# Patient Record
Sex: Female | Born: 1975 | Race: Black or African American | Hispanic: No | Marital: Single | Smoking: Former smoker
Health system: Southern US, Community
[De-identification: ages and names within clinical notes are randomized; demographics above are authoritative.]

## PROBLEM LIST (undated history)

## (undated) DIAGNOSIS — Z720 Tobacco use: Secondary | ICD-10-CM

## (undated) DIAGNOSIS — U071 COVID-19: Secondary | ICD-10-CM

## (undated) DIAGNOSIS — R519 Headache, unspecified: Secondary | ICD-10-CM

## (undated) DIAGNOSIS — Z6841 Body Mass Index (BMI) 40.0 and over, adult: Secondary | ICD-10-CM

## (undated) DIAGNOSIS — B009 Herpesviral infection, unspecified: Secondary | ICD-10-CM

## (undated) DIAGNOSIS — G4733 Obstructive sleep apnea (adult) (pediatric): Secondary | ICD-10-CM

## (undated) DIAGNOSIS — F1010 Alcohol abuse, uncomplicated: Secondary | ICD-10-CM

## (undated) DIAGNOSIS — I10 Essential (primary) hypertension: Secondary | ICD-10-CM

## (undated) DIAGNOSIS — E119 Type 2 diabetes mellitus without complications: Secondary | ICD-10-CM

## (undated) DIAGNOSIS — L409 Psoriasis, unspecified: Secondary | ICD-10-CM

## (undated) DIAGNOSIS — E785 Hyperlipidemia, unspecified: Secondary | ICD-10-CM

## (undated) DIAGNOSIS — M1990 Unspecified osteoarthritis, unspecified site: Secondary | ICD-10-CM

## (undated) DIAGNOSIS — M5412 Radiculopathy, cervical region: Secondary | ICD-10-CM

## (undated) HISTORY — DX: Radiculopathy, cervical region: M54.12

## (undated) HISTORY — DX: Essential (primary) hypertension: I10

## (undated) HISTORY — DX: Hyperlipidemia, unspecified: E78.5

## (undated) HISTORY — DX: COVID-19: U07.1

## (undated) HISTORY — DX: Headache, unspecified: R51.9

## (undated) HISTORY — DX: Type 2 diabetes mellitus without complications: E11.9

## (undated) HISTORY — DX: Herpesviral infection, unspecified: B00.9

## (undated) HISTORY — PX: OTHER SURGICAL HISTORY: SHX169

## (undated) HISTORY — DX: Unspecified osteoarthritis, unspecified site: M19.90

---
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
## 1997-09-25 ENCOUNTER — Ambulatory Visit (HOSPITAL_COMMUNITY): Admission: RE | Admit: 1997-09-25 | Discharge: 1997-09-25 | Payer: Self-pay | Admitting: Family Medicine

## 1998-04-04 DIAGNOSIS — E119 Type 2 diabetes mellitus without complications: Secondary | ICD-10-CM

## 1998-04-04 HISTORY — DX: Type 2 diabetes mellitus without complications: E11.9

## 1999-03-12 HISTORY — PX: BREAST BIOPSY: SHX20

## 1999-04-06 ENCOUNTER — Emergency Department (HOSPITAL_COMMUNITY): Admission: EM | Admit: 1999-04-06 | Discharge: 1999-04-07 | Payer: Self-pay | Admitting: Emergency Medicine

## 1999-04-06 ENCOUNTER — Encounter: Payer: Self-pay | Admitting: Emergency Medicine

## 1999-11-25 ENCOUNTER — Other Ambulatory Visit: Admission: RE | Admit: 1999-11-25 | Discharge: 1999-11-25 | Payer: Self-pay | Admitting: *Deleted

## 2000-10-04 ENCOUNTER — Emergency Department (HOSPITAL_COMMUNITY): Admission: EM | Admit: 2000-10-04 | Discharge: 2000-10-04 | Payer: Self-pay | Admitting: Emergency Medicine

## 2003-02-24 DIAGNOSIS — E119 Type 2 diabetes mellitus without complications: Secondary | ICD-10-CM | POA: Insufficient documentation

## 2003-10-16 ENCOUNTER — Encounter: Admission: RE | Admit: 2003-10-16 | Discharge: 2003-10-16 | Payer: Self-pay | Admitting: Family Medicine

## 2003-12-09 ENCOUNTER — Ambulatory Visit: Payer: Self-pay | Admitting: Family Medicine

## 2003-12-09 ENCOUNTER — Ambulatory Visit: Payer: Self-pay | Admitting: *Deleted

## 2004-01-12 ENCOUNTER — Emergency Department (HOSPITAL_COMMUNITY): Admission: EM | Admit: 2004-01-12 | Discharge: 2004-01-12 | Payer: Self-pay | Admitting: *Deleted

## 2004-02-20 ENCOUNTER — Ambulatory Visit: Payer: Self-pay | Admitting: Family Medicine

## 2004-02-23 ENCOUNTER — Ambulatory Visit: Payer: Self-pay | Admitting: Family Medicine

## 2004-04-20 ENCOUNTER — Ambulatory Visit: Payer: Self-pay | Admitting: Family Medicine

## 2004-07-03 DIAGNOSIS — B009 Herpesviral infection, unspecified: Secondary | ICD-10-CM | POA: Insufficient documentation

## 2004-07-05 ENCOUNTER — Ambulatory Visit: Payer: Self-pay | Admitting: Family Medicine

## 2004-07-08 ENCOUNTER — Ambulatory Visit: Payer: Self-pay | Admitting: Internal Medicine

## 2004-07-30 ENCOUNTER — Ambulatory Visit: Payer: Self-pay | Admitting: Family Medicine

## 2004-08-03 ENCOUNTER — Ambulatory Visit: Payer: Self-pay | Admitting: Family Medicine

## 2004-08-18 ENCOUNTER — Ambulatory Visit: Payer: Self-pay | Admitting: Family Medicine

## 2004-08-18 ENCOUNTER — Ambulatory Visit (HOSPITAL_COMMUNITY): Admission: RE | Admit: 2004-08-18 | Discharge: 2004-08-18 | Payer: Self-pay | Admitting: Family Medicine

## 2004-09-02 ENCOUNTER — Ambulatory Visit: Payer: Self-pay | Admitting: Family Medicine

## 2004-09-08 ENCOUNTER — Encounter: Admission: RE | Admit: 2004-09-08 | Discharge: 2004-12-07 | Payer: Self-pay | Admitting: Family Medicine

## 2004-09-13 ENCOUNTER — Ambulatory Visit: Payer: Self-pay | Admitting: Family Medicine

## 2004-09-23 ENCOUNTER — Ambulatory Visit: Payer: Self-pay | Admitting: Family Medicine

## 2004-09-30 ENCOUNTER — Ambulatory Visit: Payer: Self-pay | Admitting: Family Medicine

## 2004-10-07 ENCOUNTER — Ambulatory Visit: Payer: Self-pay | Admitting: Family Medicine

## 2004-12-03 ENCOUNTER — Ambulatory Visit: Payer: Self-pay | Admitting: Family Medicine

## 2004-12-20 ENCOUNTER — Encounter: Admission: RE | Admit: 2004-12-20 | Discharge: 2005-03-20 | Payer: Self-pay | Admitting: Family Medicine

## 2005-01-05 ENCOUNTER — Ambulatory Visit: Payer: Self-pay | Admitting: Family Medicine

## 2005-01-14 ENCOUNTER — Ambulatory Visit (HOSPITAL_COMMUNITY): Admission: RE | Admit: 2005-01-14 | Discharge: 2005-01-14 | Payer: Self-pay | Admitting: Family Medicine

## 2005-03-07 ENCOUNTER — Ambulatory Visit: Payer: Self-pay | Admitting: Family Medicine

## 2005-05-31 ENCOUNTER — Ambulatory Visit: Payer: Self-pay | Admitting: Family Medicine

## 2005-07-19 ENCOUNTER — Ambulatory Visit: Payer: Self-pay | Admitting: Family Medicine

## 2005-09-05 ENCOUNTER — Ambulatory Visit: Payer: Self-pay | Admitting: Family Medicine

## 2005-09-19 ENCOUNTER — Ambulatory Visit: Payer: Self-pay | Admitting: Family Medicine

## 2005-09-21 ENCOUNTER — Ambulatory Visit: Payer: Self-pay | Admitting: Family Medicine

## 2006-01-27 ENCOUNTER — Ambulatory Visit: Payer: Self-pay | Admitting: Family Medicine

## 2006-03-02 ENCOUNTER — Ambulatory Visit: Payer: Self-pay | Admitting: Internal Medicine

## 2006-03-09 ENCOUNTER — Ambulatory Visit: Payer: Self-pay | Admitting: Internal Medicine

## 2006-03-24 ENCOUNTER — Ambulatory Visit: Payer: Self-pay | Admitting: Internal Medicine

## 2006-04-18 ENCOUNTER — Ambulatory Visit: Payer: Self-pay | Admitting: Internal Medicine

## 2006-06-15 ENCOUNTER — Ambulatory Visit: Payer: Self-pay | Admitting: Internal Medicine

## 2006-06-20 ENCOUNTER — Ambulatory Visit: Payer: Self-pay | Admitting: Internal Medicine

## 2006-07-18 ENCOUNTER — Ambulatory Visit: Payer: Self-pay | Admitting: Internal Medicine

## 2006-08-01 ENCOUNTER — Ambulatory Visit: Payer: Self-pay | Admitting: Internal Medicine

## 2006-12-20 ENCOUNTER — Encounter (INDEPENDENT_AMBULATORY_CARE_PROVIDER_SITE_OTHER): Payer: Self-pay | Admitting: *Deleted

## 2007-08-24 ENCOUNTER — Encounter (INDEPENDENT_AMBULATORY_CARE_PROVIDER_SITE_OTHER): Payer: Self-pay | Admitting: Internal Medicine

## 2007-09-24 ENCOUNTER — Ambulatory Visit: Payer: Self-pay | Admitting: Nurse Practitioner

## 2007-09-24 DIAGNOSIS — K029 Dental caries, unspecified: Secondary | ICD-10-CM | POA: Insufficient documentation

## 2007-09-24 DIAGNOSIS — I10 Essential (primary) hypertension: Secondary | ICD-10-CM | POA: Insufficient documentation

## 2007-09-24 DIAGNOSIS — J309 Allergic rhinitis, unspecified: Secondary | ICD-10-CM | POA: Insufficient documentation

## 2007-09-24 LAB — CONVERTED CEMR LAB
Albumin: 4.2 g/dL (ref 3.5–5.2)
Alkaline Phosphatase: 73 units/L (ref 39–117)
BUN: 5 mg/dL — ABNORMAL LOW (ref 6–23)
Basophils Relative: 0 % (ref 0–1)
Blood Glucose, Fingerstick: 228
Blood in Urine, dipstick: NEGATIVE
Creatinine, Ser: 0.49 mg/dL (ref 0.40–1.20)
Eosinophils Relative: 2 % (ref 0–5)
HCT: 39.8 % (ref 36.0–46.0)
Lymphocytes Relative: 38 % (ref 12–46)
Lymphs Abs: 2.9 10*3/uL (ref 0.7–4.0)
MCHC: 31.4 g/dL (ref 30.0–36.0)
Monocytes Absolute: 1 10*3/uL (ref 0.1–1.0)
Neutro Abs: 3.6 10*3/uL (ref 1.7–7.7)
Neutrophils Relative %: 48 % (ref 43–77)
Platelets: 481 10*3/uL — ABNORMAL HIGH (ref 150–400)
RDW: 14.4 % (ref 11.5–15.5)
Specific Gravity, Urine: 1.015
TSH: 2.525 microintl units/mL (ref 0.350–5.50)
Total Bilirubin: 0.3 mg/dL (ref 0.3–1.2)
Urobilinogen, UA: 0.2
WBC: 7.5 10*3/uL (ref 4.0–10.5)

## 2007-09-25 ENCOUNTER — Encounter (INDEPENDENT_AMBULATORY_CARE_PROVIDER_SITE_OTHER): Payer: Self-pay | Admitting: Nurse Practitioner

## 2007-10-04 ENCOUNTER — Ambulatory Visit: Payer: Self-pay | Admitting: Internal Medicine

## 2007-10-04 ENCOUNTER — Encounter (INDEPENDENT_AMBULATORY_CARE_PROVIDER_SITE_OTHER): Payer: Self-pay | Admitting: *Deleted

## 2007-10-04 LAB — CONVERTED CEMR LAB
KOH Prep: NEGATIVE
pH: 5.5

## 2007-10-09 ENCOUNTER — Ambulatory Visit: Payer: Self-pay | Admitting: Internal Medicine

## 2007-10-09 ENCOUNTER — Encounter (INDEPENDENT_AMBULATORY_CARE_PROVIDER_SITE_OTHER): Payer: Self-pay | Admitting: Nurse Practitioner

## 2007-10-10 ENCOUNTER — Encounter (INDEPENDENT_AMBULATORY_CARE_PROVIDER_SITE_OTHER): Payer: Self-pay | Admitting: Nurse Practitioner

## 2007-10-10 DIAGNOSIS — S058X9A Other injuries of unspecified eye and orbit, initial encounter: Secondary | ICD-10-CM | POA: Insufficient documentation

## 2007-10-10 LAB — CONVERTED CEMR LAB
Cholesterol: 177 mg/dL (ref 0–200)
HDL: 58 mg/dL (ref >39)
LDL Cholesterol: 105 mg/dL — ABNORMAL HIGH (ref 0–99)
Total CHOL/HDL Ratio: 3.1
Triglycerides: 69 mg/dL (ref <150)

## 2007-11-08 ENCOUNTER — Telehealth (INDEPENDENT_AMBULATORY_CARE_PROVIDER_SITE_OTHER): Payer: Self-pay | Admitting: Nurse Practitioner

## 2007-12-04 ENCOUNTER — Ambulatory Visit: Payer: Self-pay | Admitting: Internal Medicine

## 2007-12-04 ENCOUNTER — Encounter (INDEPENDENT_AMBULATORY_CARE_PROVIDER_SITE_OTHER): Payer: Self-pay | Admitting: Nurse Practitioner

## 2007-12-17 ENCOUNTER — Encounter (INDEPENDENT_AMBULATORY_CARE_PROVIDER_SITE_OTHER): Payer: Self-pay | Admitting: Nurse Practitioner

## 2007-12-17 DIAGNOSIS — L408 Other psoriasis: Secondary | ICD-10-CM | POA: Insufficient documentation

## 2007-12-21 ENCOUNTER — Telehealth (INDEPENDENT_AMBULATORY_CARE_PROVIDER_SITE_OTHER): Payer: Self-pay | Admitting: *Deleted

## 2007-12-28 ENCOUNTER — Ambulatory Visit: Payer: Self-pay | Admitting: Nurse Practitioner

## 2007-12-28 DIAGNOSIS — N760 Acute vaginitis: Secondary | ICD-10-CM

## 2007-12-28 LAB — CONVERTED CEMR LAB: KOH Prep: NEGATIVE

## 2008-01-09 ENCOUNTER — Ambulatory Visit: Payer: Self-pay | Admitting: Nurse Practitioner

## 2008-01-10 ENCOUNTER — Ambulatory Visit: Payer: Self-pay | Admitting: Nurse Practitioner

## 2008-01-10 DIAGNOSIS — M79609 Pain in unspecified limb: Secondary | ICD-10-CM

## 2008-01-10 DIAGNOSIS — Z6841 Body Mass Index (BMI) 40.0 and over, adult: Secondary | ICD-10-CM | POA: Insufficient documentation

## 2008-01-10 DIAGNOSIS — E785 Hyperlipidemia, unspecified: Secondary | ICD-10-CM

## 2008-01-10 LAB — CONVERTED CEMR LAB
Cholesterol, target level: 200 mg/dL
Hgb A1c MFr Bld: 6.6 %

## 2008-01-11 ENCOUNTER — Encounter (INDEPENDENT_AMBULATORY_CARE_PROVIDER_SITE_OTHER): Payer: Self-pay | Admitting: Nurse Practitioner

## 2008-01-11 LAB — CONVERTED CEMR LAB
ALT: 12 units/L (ref 0–35)
Alkaline Phosphatase: 100 units/L (ref 39–117)
BUN: 6 mg/dL (ref 6–23)
Creatinine, Ser: 0.5 mg/dL (ref 0.40–1.20)
Potassium: 4.4 meq/L (ref 3.5–5.3)
Total Bilirubin: 0.4 mg/dL (ref 0.3–1.2)
VLDL: 19 mg/dL (ref 0–40)

## 2008-01-30 ENCOUNTER — Telehealth (INDEPENDENT_AMBULATORY_CARE_PROVIDER_SITE_OTHER): Payer: Self-pay | Admitting: Nurse Practitioner

## 2008-03-07 ENCOUNTER — Ambulatory Visit: Payer: Self-pay | Admitting: Internal Medicine

## 2008-03-13 ENCOUNTER — Encounter (INDEPENDENT_AMBULATORY_CARE_PROVIDER_SITE_OTHER): Payer: Self-pay | Admitting: Nurse Practitioner

## 2008-03-18 ENCOUNTER — Encounter (INDEPENDENT_AMBULATORY_CARE_PROVIDER_SITE_OTHER): Payer: Self-pay | Admitting: Nurse Practitioner

## 2008-03-31 ENCOUNTER — Ambulatory Visit: Payer: Self-pay | Admitting: Nurse Practitioner

## 2008-03-31 LAB — CONVERTED CEMR LAB
Bilirubin Urine: NEGATIVE
Blood Glucose, Fingerstick: 134
Blood in Urine, dipstick: NEGATIVE
Glucose, Urine, Semiquant: NEGATIVE
KOH Prep: NEGATIVE
Ketones, urine, test strip: NEGATIVE
Nitrite: NEGATIVE
Protein, U semiquant: NEGATIVE
Specific Gravity, Urine: <1.005
Urobilinogen, UA: 0.2
pH: 7

## 2008-04-29 ENCOUNTER — Encounter (INDEPENDENT_AMBULATORY_CARE_PROVIDER_SITE_OTHER): Payer: Self-pay | Admitting: Nurse Practitioner

## 2008-08-15 ENCOUNTER — Ambulatory Visit: Payer: Self-pay | Admitting: Nurse Practitioner

## 2008-08-15 DIAGNOSIS — M722 Plantar fascial fibromatosis: Secondary | ICD-10-CM | POA: Insufficient documentation

## 2008-08-15 DIAGNOSIS — N6320 Unspecified lump in the left breast, unspecified quadrant: Secondary | ICD-10-CM | POA: Insufficient documentation

## 2008-08-15 DIAGNOSIS — N63 Unspecified lump in unspecified breast: Secondary | ICD-10-CM | POA: Insufficient documentation

## 2008-08-15 LAB — CONVERTED CEMR LAB
Blood Glucose, Fingerstick: 105
Hgb A1c MFr Bld: 6.9 %

## 2008-08-18 ENCOUNTER — Encounter: Admission: RE | Admit: 2008-08-18 | Discharge: 2008-08-18 | Payer: Self-pay | Admitting: Internal Medicine

## 2008-08-18 LAB — CONVERTED CEMR LAB
Albumin: 3.9 g/dL (ref 3.5–5.2)
Alkaline Phosphatase: 96 units/L (ref 39–117)
Basophils Relative: 1 % (ref 0–1)
Chloride: 101 meq/L (ref 96–112)
Cholesterol: 151 mg/dL (ref 0–200)
Eosinophils Absolute: 0.1 10*3/uL (ref 0.0–0.7)
Eosinophils Relative: 1 % (ref 0–5)
HCT: 36.3 % (ref 36.0–46.0)
LDL Cholesterol: 88 mg/dL (ref 0–99)
Lymphocytes Relative: 36 % (ref 12–46)
Lymphs Abs: 2.5 10*3/uL (ref 0.7–4.0)
MCV: 81.2 fL (ref 78.0–100.0)
Monocytes Absolute: 0.6 10*3/uL (ref 0.1–1.0)
Monocytes Relative: 9 % (ref 3–12)
RBC: 4.47 M/uL (ref 3.87–5.11)
Sodium: 138 meq/L (ref 135–145)
TSH: 3.538 microintl units/mL (ref 0.350–4.500)
Total CHOL/HDL Ratio: 2.9
Total Protein: 6.9 g/dL (ref 6.0–8.3)
Triglycerides: 54 mg/dL (ref <150)
VLDL: 11 mg/dL (ref 0–40)

## 2008-10-07 ENCOUNTER — Ambulatory Visit: Payer: Self-pay | Admitting: Internal Medicine

## 2008-10-07 ENCOUNTER — Encounter (INDEPENDENT_AMBULATORY_CARE_PROVIDER_SITE_OTHER): Payer: Self-pay | Admitting: Nurse Practitioner

## 2008-11-12 ENCOUNTER — Telehealth (INDEPENDENT_AMBULATORY_CARE_PROVIDER_SITE_OTHER): Payer: Self-pay | Admitting: Nurse Practitioner

## 2009-03-17 ENCOUNTER — Ambulatory Visit: Payer: Self-pay | Admitting: Physician Assistant

## 2009-03-17 DIAGNOSIS — S60949A Unspecified superficial injury of unspecified finger, initial encounter: Secondary | ICD-10-CM | POA: Insufficient documentation

## 2009-03-17 LAB — CONVERTED CEMR LAB
Blood Glucose, Fingerstick: 97
Hgb A1c MFr Bld: 6.3 %

## 2009-04-06 ENCOUNTER — Encounter: Admission: RE | Admit: 2009-04-06 | Discharge: 2009-04-06 | Payer: Self-pay | Admitting: Internal Medicine

## 2009-04-17 ENCOUNTER — Ambulatory Visit: Payer: Self-pay | Admitting: Nurse Practitioner

## 2009-04-17 DIAGNOSIS — A5901 Trichomonal vulvovaginitis: Secondary | ICD-10-CM | POA: Insufficient documentation

## 2009-04-17 LAB — CONVERTED CEMR LAB
Bilirubin Urine: NEGATIVE
Blood in Urine, dipstick: NEGATIVE
Glucose, Urine, Semiquant: NEGATIVE
KOH Prep: NEGATIVE

## 2009-04-20 ENCOUNTER — Ambulatory Visit: Payer: Self-pay | Admitting: Nurse Practitioner

## 2009-04-20 LAB — CONVERTED CEMR LAB
Cholesterol: 118 mg/dL (ref 0–200)
LDL Cholesterol: 59 mg/dL (ref 0–99)
Total CHOL/HDL Ratio: 2.5
Triglycerides: 58 mg/dL (ref <150)
VLDL: 12 mg/dL (ref 0–40)

## 2009-04-21 ENCOUNTER — Telehealth (INDEPENDENT_AMBULATORY_CARE_PROVIDER_SITE_OTHER): Payer: Self-pay | Admitting: Nurse Practitioner

## 2009-04-21 ENCOUNTER — Encounter (INDEPENDENT_AMBULATORY_CARE_PROVIDER_SITE_OTHER): Payer: Self-pay | Admitting: Nurse Practitioner

## 2009-04-21 LAB — CONVERTED CEMR LAB: Microalb, Ur: 0.83 mg/dL (ref 0.00–1.89)

## 2009-04-23 LAB — CONVERTED CEMR LAB
AST: 13 units/L (ref 0–37)
Albumin: 3.9 g/dL (ref 3.5–5.2)
BUN: 8 mg/dL (ref 6–23)
Basophils Absolute: 0.1 10*3/uL (ref 0.0–0.1)
CO2: 23 meq/L (ref 19–32)
Creatinine, Ser: 0.55 mg/dL (ref 0.40–1.20)
Eosinophils Absolute: 0.1 10*3/uL (ref 0.0–0.7)
Glucose, Bld: 80 mg/dL (ref 70–99)
Hemoglobin: 11 g/dL — ABNORMAL LOW (ref 12.0–15.0)
Lymphs Abs: 3.5 10*3/uL (ref 0.7–4.0)
Neutro Abs: 5 10*3/uL (ref 1.7–7.7)
Neutrophils Relative %: 53 % (ref 43–77)
Platelets: 481 10*3/uL — ABNORMAL HIGH (ref 150–400)
Potassium: 4.3 meq/L (ref 3.5–5.3)
TSH: 2.346 microintl units/mL (ref 0.350–4.500)
Total Protein: 7.2 g/dL (ref 6.0–8.3)

## 2009-05-01 ENCOUNTER — Ambulatory Visit: Payer: Self-pay | Admitting: Nurse Practitioner

## 2009-05-01 ENCOUNTER — Telehealth (INDEPENDENT_AMBULATORY_CARE_PROVIDER_SITE_OTHER): Payer: Self-pay | Admitting: Nurse Practitioner

## 2009-05-20 ENCOUNTER — Ambulatory Visit: Payer: Self-pay | Admitting: Nurse Practitioner

## 2009-05-22 ENCOUNTER — Ambulatory Visit: Payer: Self-pay | Admitting: Nurse Practitioner

## 2009-06-05 ENCOUNTER — Encounter (INDEPENDENT_AMBULATORY_CARE_PROVIDER_SITE_OTHER): Payer: Self-pay | Admitting: Nurse Practitioner

## 2009-09-28 ENCOUNTER — Ambulatory Visit: Payer: Self-pay | Admitting: Nurse Practitioner

## 2009-09-28 DIAGNOSIS — H02849 Edema of unspecified eye, unspecified eyelid: Secondary | ICD-10-CM

## 2009-09-30 ENCOUNTER — Telehealth (INDEPENDENT_AMBULATORY_CARE_PROVIDER_SITE_OTHER): Payer: Self-pay | Admitting: Nurse Practitioner

## 2010-01-28 ENCOUNTER — Ambulatory Visit: Payer: Self-pay | Admitting: Nurse Practitioner

## 2010-01-28 LAB — CONVERTED CEMR LAB: Blood Glucose, Fingerstick: 129

## 2010-01-30 ENCOUNTER — Encounter (INDEPENDENT_AMBULATORY_CARE_PROVIDER_SITE_OTHER): Payer: Self-pay | Admitting: Nurse Practitioner

## 2010-01-30 LAB — CONVERTED CEMR LAB
Anti Nuclear Antibody(ANA): NEGATIVE
CRP: 1.8 mg/dL — ABNORMAL HIGH (ref <0.6)
Rhuematoid fact SerPl-aCnc: <20 intl units/mL (ref 0–20)
Sed Rate: 24 mm/hr — ABNORMAL HIGH (ref 0–22)

## 2010-03-19 ENCOUNTER — Encounter (INDEPENDENT_AMBULATORY_CARE_PROVIDER_SITE_OTHER): Payer: Self-pay | Admitting: Nurse Practitioner

## 2010-05-06 NOTE — Progress Notes (Signed)
Summary: EYE IS NO BETTER  Medications Added CORTISPORIN 3.5-10000-1 SOLN (NEOMYCIN-POLYMYXIN-HC) 2 drops in left eye three times a day       Phone Note Call from Patient Call back at Home Phone [phone]   Reason for Call: Talk to Nurse Summary of Call: Nicole Mendez CALLED AND SAYS THAT HER EYE IS WORSE, SHE SAYS THAT NOW IS FEELING LIKE A LITTLE KNOT IN THE LEFT CORNER OF THE EYE AND SHE STILL HAS SOME PAIN WITH IT. Initial call taken by: Leodis Rains,  September 30, 2009 8:37 AM  Follow-up for Phone Call        Whole left eyelid is swollen, reddened, feels like there's a knot in the outside portion of the lid.  States just tearing in the morning, no drainage.  Has been going on for 3 days, no itching or burning noted currently.  Has used OTC eyedrops and warm compresses as instructed, no improvement.  Spoke with Jesse Fall -- pt. wants Rx sent to [facility]. Dutch Quint RN  September 30, 2009 9:12 AM    Additional Follow-up for Phone Call Additional follow up Details #1::        Rx for eye drops called in to [facility] -- Left message on answering machine to return call. Dutch Quint RN  September 30, 2009 2:09 PM  Left message on answering machine to return call.  Dutch Quint RN  October 01, 2009 9:22 AM  Spoke with pt. -- states she is using Rx eye drops and her eye is improving, less swollen and reddened, no drainage.  Additional Follow-up by: Dutch Quint RN,  October 02, 2009 10:43 AM    New/Updated Medications: CORTISPORIN 3.5-10000-1 SOLN (NEOMYCIN-POLYMYXIN-HC) 2 drops in left eye three times a day Prescriptions: CORTISPORIN 3.5-10000-1 SOLN (NEOMYCIN-POLYMYXIN-HC) 2 drops in left eye three times a day  #7.30ml x 0   Entered and Authorized by:   Lehman Prom FNP   Signed by:   Lehman Prom FNP on 09/30/2009   Method used:   Printed then faxed to ...       [facility] (retail)       [address] [place], [place]   [address]       Ph: [phone]       Fax: [phone]   RxID:   [id number]

## 2010-05-06 NOTE — Progress Notes (Signed)
Summary: Office Visit//DEPRESSION SCREENING  Office Visit//DEPRESSION SCREENING   Imported By: Arta Bruce 05/21/2009 15:12:03  _____________________________________________________________________  External Attachment:    Type:   Image     Comment:   External Document

## 2010-05-06 NOTE — Assessment & Plan Note (Signed)
Summary: Left eye swollen   Nurse Visit   CC:  left eye swollen and painful.  History of Present Illness: Left eye started itching yesterday, pt. rubbed at it with tissues, states lots of light yellowish drainage this morning, unable to open eye and edema noted as well.  Painful when blinking eyes, scale 4.  Sclera looks clear, no drainage currently, slight edema to lid noted with faint redness at site, painful to touch, no foreign bodies or sty noted.  Denies trauma.   Review of Systems Eyes:  Complains of discharge, eye irritation, eye pain, and itching; States itching and irritation has stopped, started yesterday.  Pain persists, edema started this morning.Marland Kitchen  Physical Exam  Eyes:  left eye - swollen upper lid pupil round miminal redness   Patient Instructions: 1)  Warm compresses - 15 minutes on then off every 2 hours. 2)  May use over the counter Red eye or visine today and tomorrow 3)  Check for any drainage from eye, matting of eyelids and swelling when you wake in the morning. 4)  if still swollen and more redness tomorrow afternoon notify this office  CC: left eye swollen, painful Is Patient Diabetic? Yes Did you bring your meter with you today? Yes  Does patient need assistance? Functional Status Self care Ambulation Normal Comments Left eyelid swollen, painful, itching Using old strips for glucose meter -- needs Rx for refill from [facility].   Allergies: No Known Drug Allergies  Orders Added: 1)  Est. Patient Level I [[address]]  Appended Document: Left eye swollen Medications Added BLOOD GLUCOSE METER  KIT (BLOOD GLUCOSE MONITORING SUPPL) use to check bloods sugar daily LANCETS  MISC (LANCETS) use to check blood sugar once daily BLOOD GLUCOSE TEST  STRP (GLUCOSE BLOOD) Use to check blood sugar once daily          Clinical Lists Changes  Medications: Removed medication of METRONIDAZOLE 500 MG TABS (METRONIDAZOLE) 4 tablets by mouth x 1 dose Added  new medication of BLOOD GLUCOSE METER  KIT (BLOOD GLUCOSE MONITORING SUPPL) use to check bloods sugar daily - Signed Added new medication of LANCETS  MISC (LANCETS) use to check blood sugar once daily - Signed Added new medication of BLOOD GLUCOSE TEST  STRP (GLUCOSE BLOOD) Use to check blood sugar once daily - Signed Rx of BLOOD GLUCOSE METER  KIT (BLOOD GLUCOSE MONITORING SUPPL) use to check bloods sugar daily;  #1 meter x 0;  Signed;  Entered by: Lehman Prom FNP;  Authorized by: Lehman Prom FNP;  Method used: Faxed to [facility], [address], [place], [place]  [address], Ph: [phone], Fax: [phone] Rx of LANCETS  MISC (LANCETS) use to check blood sugar once daily;  #100 x 5;  Signed;  Entered by: Lehman Prom FNP;  Authorized by: Lehman Prom FNP;  Method used: Faxed to [facility], [address] [place], [place]  [address], Ph: [phone], Fax: [phone] Rx of BLOOD GLUCOSE TEST  STRP (GLUCOSE BLOOD) Use to check blood sugar once daily;  #100 x 5;  Signed;  Entered by: Lehman Prom FNP;  Authorized by: Lehman Prom FNP;  Method used: Faxed to [facility], [address] [place], [place]  [address], Ph: [phone], Fax: [phone]    Prescriptions: BLOOD GLUCOSE TEST  STRP (GLUCOSE BLOOD) Use to check blood sugar once daily  #100 x 5   Entered and Authorized by:   Zena Amos  Daphine Deutscher FNP   Signed by:   Lehman Prom FNP on 09/28/2009   Method used:   Faxed to ...       [facility] (retail)       [address] [place], [place]  [address]       Ph: [phone]       Fax: [phone]   RxID:   [id number] LANCETS  MISC (LANCETS) use to check blood sugar once daily  #100 x 5   Entered and Authorized by:   Lehman Prom FNP   Signed by:   Lehman Prom FNP on 09/28/2009   Method used:    Faxed to ...       [facility] (retail)       [address] [place], [place]  [address]       Ph: [phone]       Fax: [phone]   RxID:   [id number] BLOOD GLUCOSE METER  KIT (BLOOD GLUCOSE MONITORING SUPPL) use to check bloods sugar daily  #1 meter x 0   Entered and Authorized by:   Lehman Prom FNP   Signed by:   Lehman Prom FNP on 09/28/2009   Method used:   Faxed to ...       [facility] (retail)       [address] [place], [place]  [address]       Ph: [phone]       Fax: [phone]   RxID:   [id number]

## 2010-05-06 NOTE — Letter (Signed)
Summary: hepatitis-b vaccine waiver  hepatitis-b vaccine waiver   Imported By: Arta Bruce 07/31/2009 10:54:15  _____________________________________________________________________  External Attachment:    Type:   Image     Comment:   External Document

## 2010-05-06 NOTE — Assessment & Plan Note (Signed)
Summary: Complete Physical Exam   Vital Signs:  Patient profile:   35 year old female LMP:     04/2009 Weight:      291 pounds BMI:     57.04 BSA:     0.82 Temp:     97.8 degrees F oral Pulse rate:   106 / minute Pulse rhythm:   regular Resp:     20 per minute BP sitting:   127 / 81  (left arm) Cuff size:   large  Vitals Entered By: Levon Hedger (April 17, 2009 2:33 PM)  Nutrition Counseling: Patient's BMI is greater than 25 and therefore counseled on weight management options. CC: CPP.., Hypertension Management, Lipid Management Is Patient Diabetic? Yes Pain Assessment Patient in pain? no      CBG Result 108 CBG Device ID B  Does patient need assistance? Functional Status Self care Ambulation Normal  Vision Screening:      Vision Comments: 09/2009 LMP (date): 04/2009 LMP - Character: normal     Enter LMP: 04/2009 Last PAP Result per pt - normal   CC:  CPP.., Hypertension Management, and Lipid Management.  History of Present Illness:  Pt into the office for a complete physical exam  Pap - All previous pap smears have been ok. No family history of cervial or ovarian cancer Reports no sexual activity for the past year.  Mammogram - Previous mammogram x 1 week ago which was a f/u of abnormal found 6 months ago.  Optho - wears glasses. Last eye exam was 1 year ago 09/2008   Dental - Pt has been to the dentist within the past 6 months - last done 02/2009  tdap - up to date  Obesity - down 5 pounds since her last visit. Trying to decrease fried foods.   Really no increase in exercise  PPD needed for school. Pt is not fasting today for lipids.  Diabetes Management History:      The patient is a 35 years old female who comes in for evaluation of Type 2 Diabetes Mellitus.  She has not been enrolled in the "Diabetic Education Program".  She is checking home blood sugars.  She says that she is not exercising regularly.    Hypertension History:      She  denies headache, chest pain, and palpitations.  She notes no problems with any antihypertensive medication side effects.        Positive major cardiovascular risk factors include diabetes, hyperlipidemia, and hypertension.  Negative major cardiovascular risk factors include female age less than 36 years old, negative family history for ischemic heart disease, and non-tobacco-user status.        Further assessment for target organ damage reveals no history of ASHD, cardiac end-organ damage (CHF/LVH), stroke/TIA, peripheral vascular disease, renal insufficiency, or hypertensive retinopathy.    Lipid Management History:      Positive NCEP/ATP III risk factors include diabetes and hypertension.  Negative NCEP/ATP III risk factors include female age less than 25 years old, no family history for ischemic heart disease, non-tobacco-user status, no ASHD (atherosclerotic heart disease), no prior stroke/TIA, no peripheral vascular disease, and no history of aortic aneurysm.        The patient states that she does not know about the "Therapeutic Lifestyle Change" diet.  The patient does not know about adjunctive measures for cholesterol lowering.  She expresses no side effects from her lipid-lowering medication.  The patient denies any symptoms to suggest myopathy or liver disease.  Habits & Providers  Alcohol-Tobacco-Diet     Alcohol drinks/day: 0     Tobacco Status: quit     Tobacco Counseling: to remain off tobacco products     Year Quit: 2007  Exercise-Depression-Behavior     Does Patient Exercise: no     Exercise Counseling: to improve exercise regimen     Have you felt down or hopeless? no     Have you felt little pleasure in things? no     Depression Counseling: further diagnostic testing and/or other treatment is indicated     Drug Use: never     Seat Belt Use: 100     Sun Exposure: occasionally  Comments: PHQ-9 score = 2  Current Medications (verified): 1)  Metformin Hcl 500 Mg   Tabs (Metformin Hcl) .Marland Kitchen.. 1 Tablet By Mouth Two Times A Day For Blood Sugar 2)  Norvasc 10 Mg  Tabs (Amlodipine Besylate) .Marland Kitchen.. 1 Tablet By Mouth Daily For Blood Pressure 3)  Loratadine 10 Mg  Tabs (Loratadine) .Marland Kitchen.. 1 Tablet By Mouth Daily 4)  Valtrex 500 Mg Tabs (Valacyclovir Hcl) .Marland Kitchen.. 1 Tablet By Mouth Daily 5)  Pravastatin Sodium 40 Mg Tabs (Pravastatin Sodium) .... One Tablet By Mouth Nightly For Cholesterol 6)  Triamcinolone Acetonide 0.1 % Oint (Triamcinolone Acetonide) .... Mix 1:1 With Vasoline and Apply To Affected Areas To Include Chest, Back, Arms, Legs Two Times A Day 7)  Diprolene Af 0.05 % Crea (Aug Betamethasone Dipropionate) .... Apply To Affected Areas Two Times A Day 8)  Diclofenac Sodium 75 Mg Tbec (Diclofenac Sodium) .... One Tablet By Mouth Two Times A Day As Needed For Foot Pain 9)  Multivitamins  Tabs (Multiple Vitamin) .... One Tablet By Mouth Daily  Allergies (verified): No Known Drug Allergies  Social History: Smoking Status:  quit  Review of Systems General:  Denies fever. Eyes:  Denies blurring. ENT:  Denies earache. CV:  Denies chest pain or discomfort. Resp:  Denies cough. GI:  Denies abdominal pain. GU:  Denies discharge. MS:  Denies joint pain. Derm:  Complains of lesion(s) and rash; eczema - pt has been to dermatology as ordered. Neuro:  Denies headaches. Psych:  Denies anxiety and depression.  Physical Exam  General:  alert.  obese Eyes:  glasses Ears:  ear piercing(s) noted.  Bil  TM with bony landmarks present - no erythema Nose:  no nasal discharge.   Mouth:  pharynx pink and moist and fair dentition.   Neck:  supple.   Chest Wall:  no mass.   Breasts:  pendulous Lungs:  normal breath sounds.   Heart:  normal rate and regular rhythm.   Abdomen:  soft, non-tender, and normal bowel sounds.   Skin:  multiple areas of body with irregular shaped, flaky raised areas. course in nature Psych:  Oriented X3.    Diabetes Management Exam:    Foot  Exam (with socks and/or shoes not present):       Sensory-Monofilament:          Left foot: normal          Right foot: normal    Eye Exam:       Eye Exam done elsewhere          Date: 09/02/2008          Results: normal          Done by: Daleen Squibb rd optho   Impression & Recommendations:  Problem # 1:  ROUTINE GYNECOLOGICAL EXAMINATION (ICD-V72.31) PAP done mammogram  done 04/06/09 vaccines up to date rec optho and dental exam PPD placed for school  pt to bring forms in the school.  Orders: KOH/ WET Mount [phone]) Pap Smear, Thin Prep ( Collection of) (U0454) UA Dipstick w/o Micro (manual) ([address])  Problem # 2:  DIABETES MELLITUS (ICD-250.00) controlled  Her updated medication list for this problem includes:    Metformin Hcl 500 Mg Tabs (Metformin hcl) .Marland Kitchen... 1 tablet by mouth two times a day for blood sugar  Problem # 3:  HYPERTENSION, BENIGN ESSENTIAL (ICD-401.1) DASH diet continue current meds Her updated medication list for this problem includes:    Norvasc 10 Mg Tabs (Amlodipine besylate) .Marland Kitchen... 1 tablet by mouth daily for blood pressure  Orders: T-Comprehensive Metabolic Panel ([id number]) T-CBC w/Diff ([id number]) T-HIV Antibody  (Reflex) ([id number]) T-TSH ([id number]) T-Urine Microalbumin w/creat. 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 1 tablet by mouth two times a day for blood sugar 2)  Norvasc 10 Mg Tabs (Amlodipine besylate) .Marland Kitchen.. 1 tablet by mouth daily for blood pressure 3)  Loratadine 10 Mg Tabs (Loratadine) .Marland Kitchen.. 1 tablet by mouth daily 4)  Valtrex 500 Mg Tabs (Valacyclovir hcl) .Marland Kitchen.. 1 tablet by mouth daily 5)  Pravastatin Sodium 40 Mg Tabs (Pravastatin sodium)  .... One tablet by mouth nightly for cholesterol 6)  Triamcinolone Acetonide 0.1 % Oint (Triamcinolone acetonide) .... Mix 1:1 with vasoline and apply to affected areas to include chest, back, arms, legs two times a day 7)  Diprolene Af 0.05 % Crea (Aug betamethasone dipropionate) .... Apply to affected areas two times a day 8)  Diclofenac Sodium 75 Mg Tbec (Diclofenac sodium) .... One tablet by mouth two times a day as needed for foot pain 9)  Multivitamins Tabs (Multiple vitamin) .... One tablet by mouth daily 10)  Metronidazole 500 Mg Tabs (Metronidazole) .... 4 tablets by mouth x 1 dose  Diabetes Management Assessment/Plan:      The following lipid goals have been established for the patient: Total cholesterol goal of 200; LDL cholesterol goal of 100; HDL cholesterol goal of 40; Triglyceride goal of 150.  Her blood pressure goal is < 130/80.    Hypertension Assessment/Plan:      The patient's hypertensive risk group is category C: Target organ damage and/or diabetes.  Her calculated 10 year risk of coronary heart disease is 1 %.  Today's blood pressure is 127/81.  Her blood pressure goal is < 130/80.  Lipid Assessment/Plan:      Based on NCEP/ATP III, the patient's risk factor category is "history of diabetes".  The patient's lipid goals are as follows: Total cholesterol goal is 200; LDL cholesterol goal is 100; HDL cholesterol goal is 40; Triglyceride goal is 150.    Patient Instructions: 1)  Good job on your efforts to lose weight.  Keep up the good work. 2)  Follow up in officeon Monday for PPD reading and lipids. 3)  Do not eat after midnight before this exam. 4)  Look online for patient assistance program for Humira. 5)  Blood sugar and blood pressure is doing great. Continue current medications. 6)  Follow up at least every 6 months. Prescriptions: METRONIDAZOLE 500 MG TABS (METRONIDAZOLE) 4 tablets by mouth x 1  dose  #4 x 0   Entered and Authorized by:   Lehman Prom FNP    Signed by:   Lehman Prom FNP on 04/17/2009   Method used:   Print then Give to Patient   RxID:   [id number] DIPROLENE AF 0.05 % CREA (AUG BETAMETHASONE DIPROPIONATE) apply to affected areas two times a day  #60gm x 1   Entered and Authorized by:   Lehman Prom FNP   Signed by:   Lehman Prom FNP on 04/17/2009   Method used:   Faxed to ...       [facility] (retail)       [address] [place], [place]  [address]       Ph: [phone]       Fax: [phone]   RxID:   [id number] TRIAMCINOLONE ACETONIDE 0.1 % OINT (TRIAMCINOLONE ACETONIDE) Mix 1:1 with vasoline and apply to affected areas to include chest, back, arms, legs two times a day  #16 ounces x 1   Entered and Authorized by:   Lehman Prom FNP   Signed by:   Lehman Prom FNP on 04/17/2009   Method used:   Faxed to ...       [facility] - [facility] (retail)       [address] [place], [place]  [address]       Ph: [phone]       Fax: [phone]   RxID:   [id number]   Last LDL:                                                 88 (08/15/2008 10:16:00 PM)        Diabetic Foot Exam Foot Inspection Are the toenails long?                Yes         10-g (5.07) Semmes-Weinstein Monofilament Test Performed by: Levon Hedger          Right Foot          Left Foot Visual Inspection               Test Control      normal         normal Site 1         normal         normal Site 2         normal         normal Site 3         normal         normal Site 4         normal         normal Site 5         normal         normal Site 6         normal         normal Site 7         normal         normal Site 8         normal         normal Site 9         normal  normal Site 10         normal         normal  Impression      normal         normal   Laboratory Results   Urine Tests  Date/Time Received:  April 17, 2009 2:55 PM  Date/Time Reported: April 17, 2009 2:55 PM   Routine Urinalysis   Color: lt. yellow Appearance: Clear Glucose: negative   (Normal Range: Negative) Bilirubin: negative   (Normal Range: Negative) Ketone: trace (5)   (Normal Range: Negative) Spec. Gravity: 1.020   (Normal Range: 1.003-1.035) Blood: negative   (Normal Range: Negative) pH: 6.5   (Normal Range: 5.0-8.0) Protein: trace   (Normal Range: Negative) Urobilinogen: 0.2   (Normal Range: 0-1) Nitrite: negative   (Normal Range: Negative) Leukocyte Esterace: small   (Normal Range: Negative)     Blood Tests     CBG Random:: 108  Date/Time Received: April 17, 2009 3:59 PM   Wet Mount/KOH Source: vaginal WBC/hpf: 1-5 Bacteria/hpf: rare Clue cells/hpf: none Yeast/hpf: none Trichomonas/hpf: few   Prevention & Chronic Care Immunizations   Influenza vaccine: Fluvax 3+  (03/17/2009)    Tetanus booster: 03/17/2009: Tdap    Pneumococcal vaccine: Pneumovax  (08/15/2008)  Other Screening   Pap smear: per pt - normal  (11/03/2006)   Pap smear action/deferral: Ordered  (04/17/2009)   Pap smear due: 04/17/2010   Smoking status: quit  (04/17/2009)  Diabetes Mellitus   HgbA1C: 6.3  (03/17/2009)    Eye exam: normal  (09/02/2008)    Foot exam: yes  (04/17/2009)   High risk foot: Not documented   Foot care education: Not documented    Urine microalbumin/creatinine ratio: Not documented  Lipids   Total Cholesterol: 151  (08/15/2008)   LDL: 88  (08/15/2008)   LDL Direct: Not documented   HDL: 52  (08/15/2008)   Triglycerides: 54  (08/15/2008)    SGOT (AST): 13  (08/15/2008)   SGPT (ALT): 12  (08/15/2008) CMP ordered    Alkaline phosphatase: 96  (08/15/2008)   Total bilirubin: 0.2  (08/15/2008)  Hypertension   Last Blood Pressure: 127 / 81  (04/17/2009)   Serum creatinine: 0.47  (08/15/2008)   Serum potassium 4.3  (08/15/2008) CMP ordered   Self-Management Support :     Diabetes self-management support: Not documented    Hypertension self-management support: Not documented    Lipid self-management support: Not documented    Laboratory Results   Urine Tests    Routine Urinalysis   Color: lt. yellow Appearance: Clear Glucose: negative   (Normal Range: Negative) Bilirubin: negative   (Normal Range: Negative) Ketone: trace (5)   (Normal Range: Negative) Spec. Gravity: 1.020   (Normal Range: 1.003-1.035) Blood: negative   (Normal Range: Negative) pH: 6.5   (Normal Range: 5.0-8.0) Protein: trace   (Normal Range: Negative) Urobilinogen: 0.2   (Normal Range: 0-1) Nitrite: negative   (Normal Range: Negative) Leukocyte Esterace: small   (Normal Range: Negative)     Blood Tests     CBG Random:: 108mg /dL    DIRECTV KOH: Negative

## 2010-05-06 NOTE — Letter (Signed)
Summary: DERMATOLOGY   DERMATOLOGY   Imported By: Arta Bruce 03/31/2010 15:55:56  _____________________________________________________________________  External Attachment:    Type:   Image     Comment:   External Document

## 2010-05-06 NOTE — Letter (Signed)
Summary: *HSN Results Follow up  [facility]  [address] [place], [place] [address]   Phone: [phone]  Fax: [phone]      01/30/2010   Nicole Mendez [address] [place], [place]  [address]   Dear  Nicole Mendez,                            ____S.Drinkard,FNP   ____D. Gore,FNP       ____B. McPherson,MD   ____V. Rankins,MD    ____E. Mulberry,MD    _X___N. Daphine Deutscher, FNP  ____D. Reche Dixon, MD    ____K. Philipp Deputy, MD    ____Other     This letter is to inform you that your recent test(s):  _______Pap Smear    ___X____Lab Test     _______X-ray    ___X____ is within acceptable limits  _______ requires a medication change  _______ requires a follow-up lab visit  _______ requires a follow-up visit with your [name]   Comments: Labs done during recent office visit show that your diabetes is doing well.  Continue your efforts and watching your diet and try to increase your exercise in addition to medications.  Don't forget your appointment with the dermatologist.  Be sure to discuss the option of injection medications for your psoriasis.  This office is willing to assist as much as we can to get this medication for you. _________________________________________________________ If you have any questions, please contact our office [phone].                    Sincerely,   Lehman Prom FNP [facility]

## 2010-05-06 NOTE — Letter (Signed)
Summary: *HSN Results Follow up  [facility]  [address] [place], [place] [address]   Phone: [phone]  Fax: [phone]      04/21/2009   Nicole Mendez [address] [place], [place]  [address]   Dear  [name],                            ____S.Drinkard,FNP   ____D. Gore,FNP       ____B. McPherson,MD   ____V. Rankins,MD    ____E. Mulberry,MD    __X__N. Daphine Deutscher, FNP  ____D. Reche Dixon, MD    ____K. Philipp Deputy, MD    ____Other     This letter is to inform you that your recent test(s):  __X_____Pap Smear    ___X____Lab Test     _______X-ray    Comments:  Labs done during your recent office visit are ok except you are slightly anemic.  You should start taking a multivitamin daily.  Pap Smear results __________________________________.       _________________________________________________________ If you have any questions, please contact our office [phone].                    Sincerely,    Lehman Prom FNP [facility]

## 2010-05-06 NOTE — Letter (Signed)
Summary: IMMUNIZATION RECORD  IMMUNIZATION RECORD   Imported By: Arta Bruce 05/25/2009 12:33:12  _____________________________________________________________________  External Attachment:    Type:   Image     Comment:   External Document

## 2010-05-06 NOTE — Progress Notes (Signed)
Summary: Form completion   Phone Note Outgoing Call   Summary of Call: Pt dropped off a form for completion. Contact pt and see if she has an immunization record from her childhood. This office has no record of MMR, varicella or hepatitis B vaccines.  If she does not know if or have the dates she recieved these vaccines she will need to get blood drawn to check for immunity prior to completion of the form Initial call taken by: Lehman Prom FNP,  April 21, 2009 8:08 AM  Follow-up for Phone Call        CALLED PT TWICE LEFT MESSAGE TO [place]E [place] A CALL BACK CONCERNING THE FORM SHE LEFT [place] Follow-up by: Arta Bruce,  April 22, 2009 2:24 PM  Additional Follow-up for Phone Call Additional follow up Details #1::        I THINK Nnenna Meador SAT THE FORM ON YOUR DESK MONDAY Additional Follow-up by: Arta Bruce,  May 05, 2009 9:14 AM    Additional Follow-up for Phone Call Additional follow up Details #2::    Form is complete pt is informed to come in and pick up form. Follow-up by: Levon Hedger,  May 05, 2009 4:32 PM

## 2010-05-06 NOTE — Progress Notes (Signed)
Summary: Medication refills   Phone Note Call from Patient   Summary of Call: pt needs refills on all her medications sent to [facility] pharmacy Initial call taken by: Levon Hedger,  May 01, 2009 12:56 PM  Follow-up for Phone Call        refills sent to [facility] pharmacy Follow-up by: Lehman Prom FNP,  May 01, 2009 2:10 PM    Prescriptions: PRAVASTATIN SODIUM 40 MG TABS (PRAVASTATIN SODIUM) One tablet by mouth nightly for cholesterol  #30 x 5   Entered and Authorized by:   Lehman Prom FNP   Signed by:   Lehman Prom FNP on 05/01/2009   Method used:   Faxed to ...       [facility] - [facility] (retail)       [address] [place], [place]  [address]       Ph: [phone]       Fax: [phone]   RxID:   [id number] VALTREX 500 MG TABS (VALACYCLOVIR HCL) 1 tablet by mouth daily  #30 x 5   Entered and Authorized by:   Lehman Prom FNP   Signed by:   Lehman Prom FNP on 05/01/2009   Method used:   Faxed to ...       [facility] - Pharmac (retail)       [address] [place], [place]  [address]       Ph: [phone]       Fax: [phone]   RxID:   [id number] LORATADINE 10 MG  TABS (LORATADINE) 1 tablet by mouth daily  #30 x 5   Entered and Authorized by:   Lehman Prom FNP   Signed by:   Lehman Prom FNP on 05/01/2009   Method used:   Faxed to ...       [facility] - Pharmac (retail)       [address] [place], [place]  [address]       Ph: [phone]       Fax: [phone]   RxID:   [id number] NORVASC 10 MG  TABS (AMLODIPINE BESYLATE) 1 tablet by mouth daily for blood pressure  #30 x 5   Entered and Authorized by:   Lehman Prom FNP   Signed by:   Lehman Prom FNP on 05/01/2009   Method used:   Faxed to ...       [facility] - Pharmac (retail)       [address] [place], [place]   [address]       Ph: [phone]       Fax: [phone]   RxID:   [id number] METFORMIN HCL 500 MG  TABS (METFORMIN HCL) 1 tablet by mouth two times a day for blood sugar  #60 x 5   Entered and Authorized by:   Lehman Prom FNP   Signed by:   Lehman Prom FNP on 05/01/2009   Method used:   Faxed to ...       [facility] - Pharmac (retail)       [address] [place], [place]  [address]       Ph: [phone]       Fax: [phone]   RxID:   [id number]

## 2010-05-06 NOTE — Letter (Signed)
Summary: Handout Printed  Printed Handout:  - Trichomonas 

## 2010-05-06 NOTE — Assessment & Plan Note (Signed)
Summary: Diabetes   Vital Signs:  Patient profile:   35 year old female LMP:     01/28/2010 Weight:      289.6 pounds BMI:     56.76 Temp:     97.1 degrees F oral Pulse rate:   100 / minute Pulse rhythm:   regular Resp:     20 per minute BP sitting:   130 / 70  (left arm) Cuff size:   large  Vitals Entered By: Levon Hedger (January 28, 2010 3:04 PM)  Nutrition Counseling: Patient's BMI is greater than 25 and therefore counseled on weight management options. CC: check skin for psoriasis Is Patient Diabetic? Yes Pain Assessment Patient in pain? no      CBG Result 129 CBG Device ID B  Does patient need assistance? Functional Status Self care Ambulation Normal LMP (date): 01/28/2010 LMP - Character: normal     Enter LMP: 01/28/2010 Last PAP Result NEGATIVE FOR INTRAEPITHELIAL LESIONS OR MALIGNANCY.   CC:  check skin for psoriasis.  History of Present Illness:  Pt into the office f/u on diabetes.  Diabetes - Pt is asking for a diabetic diet plan.    She did not bring her medications with her into the office today.  Pt advised to bring all meds with her into the office.  Psoriasis - ongoing. pt has been to dermatology at [facility] street and has been prescribed several ointments without resolution of the symptoms. She really would like a different treatment option.  Finances keep her from getting those options  Habits & Providers  Alcohol-Tobacco-Diet     Alcohol drinks/day: 0     Tobacco Status: quit     Tobacco Counseling: to remain off tobacco products     Year Quit: 2007  Exercise-Depression-Behavior     Does Patient Exercise: no     Exercise Counseling: to improve exercise regimen     Depression Counseling: further diagnostic testing and/or other treatment is indicated     Drug Use: never     Seat Belt Use: 100     Sun Exposure: occasionally  Allergies (verified): No Known Drug Allergies  Review of Systems General:  Denies fever. CV:  Denies  chest pain or discomfort. Resp:  Denies cough. GI:  Denies abdominal pain, nausea, and vomiting. Derm:  Complains of itching and lesion(s).  Physical Exam  General:  alert.   Head:  normocephalic.   Lungs:  normal breath sounds.   Heart:  normal rate.   Abdomen:  normal bowel sounds.   Neurologic:  alert & oriented X3.   Skin:  Psoriatic plaques to arms, neck, trunk and legs Psych:  Oriented X3.     Impression & Recommendations:  Problem # 1:  DIABETES MELLITUS (ICD-250.00) will check hgba1c today will refill meds Her updated medication list for this problem includes:    Metformin Hcl 500 Mg Tabs (Metformin hcl) .Marland Kitchen... 1 tablet by mouth two times a day for blood sugar  Orders: Capillary Blood Glucose/CBG (82948) T- Hemoglobin A1C ([address]147)  Problem # 2:  NEED PROPHYLACTIC VACCINATION&INOCULATION FLU (ICD-V04.81) given to pt today in office  Problem # 3:  HYPERTENSION, BENIGN ESSENTIAL (ICD-401.1) BP stable Her updated medication list for this problem includes:    Norvasc 10 Mg Tabs (Amlodipine besylate) .Marland Kitchen... 1 tablet by mouth daily for blood pressure  Problem # 4:  OBESITY (ICD-278.00) pt would like to lose weight. she has started to make healthier food choices and has purchased some exercise equipment  Problem # 5:  PSORIASIS (ICD-696.1) Hopeful that pt will get to see dermatology and that office will consult with this provider about starting pt on humira Orders: T-ANA [phone]) T-Sed Rate (Automated) [phone]) T-Rheumatoid Factor [phone]) T-C-Reactive Protein [phone]) Dermatology Referral (Derma)  Complete Medication List: 1)  Metformin Hcl 500 Mg Tabs (Metformin hcl) .Marland Kitchen.. 1 tablet by mouth two times a day for blood sugar 2)  Norvasc 10 Mg Tabs (Amlodipine besylate) .Marland Kitchen.. 1 tablet by mouth daily for blood pressure 3)  Loratadine 10 Mg Tabs (Loratadine) .Marland Kitchen.. 1 tablet by mouth daily 4)  Valtrex 500 Mg Tabs (Valacyclovir hcl) .Marland Kitchen.. 1 tablet by  mouth daily 5)  Pravastatin Sodium 40 Mg Tabs (Pravastatin sodium) .... One tablet by mouth nightly for cholesterol 6)  Triamcinolone Acetonide 0.1 % Oint (Triamcinolone acetonide) .... Mix 1:1 with vasoline and apply to affected areas to include chest, back, arms, legs two times a day 7)  Diprolene Af 0.05 % Crea (Aug betamethasone dipropionate) .... Apply to affected areas two times a day 8)  Multivitamins Tabs (Multiple vitamin) .... One tablet by mouth daily 9)  Blood Glucose Meter Kit (Blood glucose monitoring suppl) .... Use to check bloods sugar daily 10)  Lancets Misc (Lancets) .... Use to check blood sugar once daily 11)  Blood Glucose Test Strp (Glucose blood) .... Use to check blood sugar once daily  Other Orders: Flu Vaccine 46yrs + ([address]) Admin 1st Vaccine ([address])  Patient Instructions: 1)  Schedule a complete physical exam in January 2012. 2)  Will need PAP, labs, u/a, microalbumin. 3)  This office will refer you to [facility].  You will be notified of the time/date of the appointment. 4)  Will see if there is a financial assistance plan for Humira. If the dermatologist can prescribe then this office will be glad to do what is necessary to get the medication from patient assistance and labs associated Prescriptions: TRIAMCINOLONE ACETONIDE 0.1 % OINT (TRIAMCINOLONE ACETONIDE) Mix 1:1 with vasoline and apply to affected areas to include chest, back, arms, legs two times a day  #16 ounces x 1   Entered and Authorized by:   Lehman Prom FNP   Signed by:   Lehman Prom FNP on 01/28/2010   Method used:   Faxed to ...       [facility] - Pharmac (retail)       [address] [place], [place]  [address]       Ph: [phone]       Fax: [phone]   RxID:   [id number] PRAVASTATIN SODIUM 40 MG TABS (PRAVASTATIN SODIUM) One tablet by mouth nightly for cholesterol  #30 x 5   Entered and Authorized by:   Lehman Prom FNP    Signed by:   Lehman Prom FNP on 01/28/2010   Method used:   Faxed to ...       [facility] - Pharmac (retail)       [address] [place], [place]  [address]       Ph: [phone]       Fax: [phone]   RxID:   [id number] VALTREX 500 MG TABS (VALACYCLOVIR HCL) 1 tablet by mouth daily  #30 x 5   Entered and Authorized by:   Lehman Prom FNP   Signed by:   Lehman Prom FNP on 01/28/2010   Method used:   Faxed to .Marland KitchenMarland Kitchen  [facility] - Pharmac (retail)       [address] [place], [place]  [address]       Ph: [phone]       Fax: [phone]   RxID:   [id number] LORATADINE 10 MG  TABS (LORATADINE) 1 tablet by mouth daily  #30 x 5   Entered and Authorized by:   Lehman Prom FNP   Signed by:   Lehman Prom FNP on 01/28/2010   Method used:   Faxed to ...       [facility] - Pharmac (retail)       [address] [place], [place]  [address]       Ph: [phone]       Fax: [phone]   RxID:   [id number] NORVASC 10 MG  TABS (AMLODIPINE BESYLATE) 1 tablet by mouth daily for blood pressure  #30 x 5   Entered and Authorized by:   Lehman Prom FNP   Signed by:   Lehman Prom FNP on 01/28/2010   Method used:   Faxed to ...       [facility] - Pharmac (retail)       [address] [place], [place]  [address]       Ph: [phone]       Fax: [phone]   RxID:   [id number] METFORMIN HCL 500 MG  TABS (METFORMIN HCL) 1 tablet by mouth two times a day for blood sugar  #60 x 5   Entered and Authorized by:   Lehman Prom FNP   Signed by:   Lehman Prom FNP on 01/28/2010   Method used:   Faxed to ...       [facility] - Pharmac (retail)       [address] [place], [place]  [address]       Ph: [phone]       Fax: [phone]   RxID:    [id number]    Orders Added: 1)  Capillary Blood Glucose/CBG [82948] 2)  Est. Patient Level III [99213] 3)  T- Hemoglobin A1C [83036-23375] 4)  T-ANA [38101-75102] 5)  T-Sed Rate (Automated) [58527-78242] 6)  T-Rheumatoid Factor [35361-44315] 7)  T-C-Reactive Protein [40086-76195] 8)  Flu Vaccine 37yrs + [[address]] 9)  Admin 1st Vaccine [90471] 10)  Dermatology Referral [Derma]   Immunizations Administered:  Influenza Vaccine # 1:    Vaccine Type: Fluvax 3+    Site: left deltoid    Mfr: GlaxoSmithKline    Dose: 0.5 ml    Route: IM    Given by: Levon Hedger    Exp. Date: 10/02/2010    Lot #: [id number]    VIS given: 10/27/09 version given January 28, 2010.  Flu Vaccine Consent Questions:    Do you have a history of severe allergic reactions to this vaccine? no    Any prior history of allergic reactions to egg and/or gelatin? no    Do you have a sensitivity to the preservative Thimersol? no    Do you have a past history of Guillan-Barre Syndrome? no    Do you currently have an acute febrile illness? no    Have you ever had a severe reaction to latex? no    Vaccine information given and explained to patient? yes    Are you currently pregnant? no  ndc  [phone]  Immunizations Administered:  Influenza Vaccine # 1:    Vaccine Type: Fluvax 3+    Site: left deltoid    Mfr: GlaxoSmithKline    Dose: 0.5 ml    Route: IM    Given by: Levon Hedger    Exp. Date: 10/02/2010    Lot #: [id number]    VIS given: 10/27/09 version given January 28, 2010.

## 2010-06-22 ENCOUNTER — Encounter (INDEPENDENT_AMBULATORY_CARE_PROVIDER_SITE_OTHER): Payer: Self-pay | Admitting: Nurse Practitioner

## 2010-06-22 ENCOUNTER — Other Ambulatory Visit: Payer: Self-pay | Admitting: Nurse Practitioner

## 2010-06-22 ENCOUNTER — Encounter: Payer: Self-pay | Admitting: Nurse Practitioner

## 2010-06-22 LAB — CONVERTED CEMR LAB
Bilirubin Urine: NEGATIVE
Blood in Urine, dipstick: NEGATIVE
Glucose, Urine, Semiquant: NEGATIVE
Hgb A1c MFr Bld: 6.2 %
KOH Prep: NEGATIVE
Ketones, urine, test strip: NEGATIVE
Nitrite: NEGATIVE
Rapid HIV Screen: NEGATIVE
WBC Urine, dipstick: NEGATIVE

## 2010-06-25 ENCOUNTER — Encounter (INDEPENDENT_AMBULATORY_CARE_PROVIDER_SITE_OTHER): Payer: Self-pay | Admitting: Nurse Practitioner

## 2010-07-01 NOTE — Letter (Signed)
Summary: *HSN Results Follow up  [facility]  [address] [place], [place] [address]   Phone: [phone]  Fax: [phone]      06/25/2010   Nicole Mendez [address] [place], [place]  [address]   Dear  [name],                            ____S.Drinkard,FNP   ____D. Gore,FNP       ____B. McPherson,MD   ____V. Rankins,MD    ____E. Mulberry,MD    _X__N. Daphine Deutscher, FNP  ____D. Reche Dixon, MD    ____K. Philipp Deputy, MD    ____Other     This letter is to inform you that your recent test(s):  ___X____Pap Smear    _______Lab Test     _______X-ray    ___X____ is within acceptable limits  _______ requires a medication change  _______ requires a follow-up lab visit  _______ requires a follow-up visit with your provider   Comments:       _________________________________________________________ If you have any questions, please contact our office                     Sincerely,    Lehman Prom FNP [facility]

## 2010-07-01 NOTE — Assessment & Plan Note (Signed)
Summary: Complete Physical Exam   Vital Signs:  Patient profile:   35 year old female LMP:     06/15/2010 Height:      53.50 inches Weight:      291.1 pounds BMI:     71.76 Temp:     98.2 degrees F oral Pulse rate:   100 / minute Pulse rhythm:   regular Resp:     20 per minute BP sitting:   147 / 95  (left arm) Cuff size:   large  Vitals Entered By: Levon Hedger (June 22, 2010 8:57 AM)  Nutrition Counseling: Patient's BMI is greater than 25 and therefore counseled on weight management options. CC: CPP, Hypertension Management, Lipid Management Is Patient Diabetic? Yes Pain Assessment Patient in pain? no      Nutritional Status Detail Fasting CBG Result 91 CBG Device ID N  Does patient need assistance? Functional Status Self care Ambulation Normal LMP (date): 06/15/2010 LMP - Character: normal     Enter LMP: 06/15/2010 Last PAP Result NEGATIVE FOR INTRAEPITHELIAL LESIONS OR MALIGNANCY.   CC:  CPP, Hypertension Management, and Lipid Management.  History of Present Illness:  Pt into the office for a complete physical exam  PAP - last done 1 year ago.  no history of abnormal PAP No family history of ovarian or cervical CA  Mammogram - previously done due to ? lumps in breast but those were determined to be fatty tissue. Next one indicated at age 89  Optho - last done 1 year ago.  [facility]iv[facility]  Dental - pt has been to [facility] for routine cleaning.  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  She has not been enrolled in the "Diabetic Education Program".  She states understanding of dietary principles and is following her diet appropriately.  No sensory loss is reported.  Self foot exams are not being performed.  She is checking home blood sugars.  She says that she is not exercising regularly.        Hypoglycemic  symptoms are not occurring.  No hyperglycemic symptoms are reported.        There are no symptoms to suggest diabetic complications.  No changes have been made to her treatment plan since last visit.    Hypertension History:      She denies headache, chest pain, and palpitations.  Pt did NOT take her meds today due to fasting status.        Positive major cardiovascular risk factors include diabetes, hyperlipidemia, and hypertension.  Negative major cardiovascular risk factors include female age less than 70 years old, negative family history for ischemic heart disease, and non-tobacco-user status.        Further assessment for target organ damage reveals no history of ASHD, cardiac end-organ damage (CHF/LVH), stroke/TIA, peripheral vascular disease, renal insufficiency, or hypertensive retinopathy.    Lipid Management History:      Positive NCEP/ATP III risk factors include diabetes and hypertension.  Negative NCEP/ATP III risk factors include female age less than 79 years old, no family history for ischemic heart disease, non-tobacco-user status, no ASHD (atherosclerotic heart disease), no prior stroke/TIA, no peripheral vascular disease, and no history of aortic aneurysm.        The patient states that she does not know about the "Therapeutic Lifestyle Change" diet.  Her compliance with the TLC diet is fair.  The patient expresses understanding of adjunctive measures for cholesterol lowering.  She expresses no side effects from her lipid-lowering medication.  The patient denies any symptoms to suggest myopathy or liver disease.      Allergies (verified): No Known Drug Allergies  Review of Systems General:  Denies fatigue. Eyes:  Denies discharge. ENT:  Denies earache. CV:  Denies fatigue. Resp:  Denies cough. GI:  Denies abdominal pain, nausea, and vomiting. GU:  Denies discharge. MS:  Denies joint pain. Derm:  Complains of lesion(s); denies dryness; improving since she is using the  cream as prescribed by dermatology. Neuro:  Denies headaches. Psych:  Denies anxiety and depression.  Physical Exam  General:  alert and overweight-appearing.   Head:  normocephalic.   Eyes:  pupils round and pupils reactive to light.   Ears:  moderate cerumen impaction Nose:  no nasal discharge.   Mouth:  fair dentition.   Neck:  supple.   Chest Wall:  no deformities.   Breasts:  pendulous Lungs:  normal breath sounds.   Heart:  normal rate and regular rhythm.   Abdomen:  soft, non-tender, and normal bowel sounds.  obese Rectal:  no external abnormalities.   Msk:  normal ROM.   Extremities:  no edema Neurologic:  alert & oriented X3.   Skin:  psoriatic plaques Psych:  Oriented X3.    Pelvic Exam  Vulva:      normal appearance.   Urethra and Bladder:      Urethra--no masses.   Vagina:      physiologic discharge.   Cervix:      midposition.   Uterus:      smooth.   Adnexa:      nontender bilaterally.    Diabetes Management Exam:       Nails:          Left foot: normal          Right foot: normal    Impression & Recommendations:  Problem # 1:  ROUTINE GYNECOLOGICAL EXAMINATION (ICD-V72.31)  Orders: UA Dipstick W/ Micro (manual) (16109) KOH/ WET Mount [phone]) Pap Smear, Thin Prep ( Collection of) [phone]) T- GC Chlamydia (91478) Vision Screening (29562) Rapid HIV  (92370) T-Syphilis Test (RPR) (13086-57846) T-TSH (96295-28413) T-Urine Microalbumin w/creat. ratio [phone])  Problem # 2:  DIABETES MELLITUS (ICD-250.00)  Her updated medication list for this problem includes:    Metformin Hcl 500 Mg Tabs (Metformin hcl) .Marland Kitchen... 1 tablet by mouth two times a day for blood sugar  Orders: Hgb A1C (40347QQ) Capillary Blood Glucose/CBG (59563)  Problem # 3:  HYPERCHOLESTEROLEMIA (ICD-272.0)  Her updated medication list for this problem includes:    Lipitor 10 Mg Tabs (Atorvastatin calcium) ..... One tablet by mouth nightly for  cholesterol  Orders: T-Lipid Profile (87564-33295)  Problem # 4:  HYPERTENSION, BENIGN ESSENTIAL (ICD-401.1)  Her updated medication list for this problem includes:    Norvasc 10 Mg Tabs (Amlodipine besylate) .Marland Kitchen... 1 tablet by mouth daily for blood pressure  Orders: EKG w/ Interpretation (93000) T-Comprehensive Metabolic Panel (18841-66063) T-CBC w/Diff (01601-09323)  Complete Medication List: 1)  Metformin Hcl 500 Mg Tabs (Metformin hcl) .Marland Kitchen.. 1 tablet by mouth two times a day for blood sugar 2)  Norvasc 10 Mg Tabs (Amlodipine besylate) .Marland Kitchen.. 1 tablet by mouth daily for blood pressure 3)  Loratadine 10 Mg Tabs (Loratadine) .Marland Kitchen.. 1 tablet by mouth daily 4)  Valtrex 500 Mg Tabs (Valacyclovir hcl) .Marland KitchenMarland KitchenMarland Kitchen 1  tablet by mouth daily 5)  Lipitor 10 Mg Tabs (Atorvastatin calcium) .... One tablet by mouth nightly for cholesterol 6)  Triamcinolone Acetonide 0.1 % Oint (Triamcinolone acetonide) .... Mix 1:1 with vasoline and apply to affected areas to include chest, back, arms, legs two times a day 7)  Clobetasol Propionate 0.05 % Oint (Clobetasol propionate) .... Apply to affected area two times a day until healed 8)  Multivitamins Tabs (Multiple vitamin) .... One tablet by mouth daily 9)  Blood Glucose Meter Kit (Blood glucose monitoring suppl) .... Use to check bloods sugar daily 10)  Lancets Misc (Lancets) .... Use to check blood sugar once daily 11)  Blood Glucose Test Strp (Glucose blood) .... Use to check blood sugar once daily  Diabetes Management Assessment/Plan:      The following lipid goals have been established for the patient: Total cholesterol goal of 200; LDL cholesterol goal of 100; HDL cholesterol goal of 40; Triglyceride goal of 150.  Her blood pressure goal is < 130/80.    Hypertension Assessment/Plan:      The patient's hypertensive risk group is category C: Target organ damage and/or diabetes.  Her calculated 10 year risk of coronary heart disease is 3 %.  Today's blood pressure  is 147/95.  Her blood pressure goal is < 130/80.  Lipid Assessment/Plan:      Based on NCEP/ATP III, the patient's risk factor category is "history of diabetes".  The patient's lipid goals are as follows: Total cholesterol goal is 200; LDL cholesterol goal is 100; HDL cholesterol goal is 40; Triglyceride goal is 150.    Patient Instructions: 1)  You have moderate amount of ear wax in your ears.  Use some over the counter - Debrox to soften the wax in your ears. 2)  Your labs will be checked today and you will be notified of the reuslts 3)  All your refills have been sent to the pharmacy 4)  Diabetes - Hgba1c = 6.2  Good work. 5)  Follow up in 4 months for diabetes. 6)  HAPPY BIRTHDAY! Prescriptions: CLOBETASOL PROPIONATE 0.05 % OINT (CLOBETASOL PROPIONATE) Apply to affected area two times a day until healed  #120gm x 0   Entered and Authorized by:   Lehman Prom FNP   Signed by:   Lehman Prom FNP on 06/22/2010   Method used:   Historical   RxID:   [id number] MULTIVITAMINS  TABS (MULTIPLE VITAMIN) One tablet by mouth daily  #30 x 11   Entered and Authorized by:   Lehman Prom FNP   Signed by:   Lehman Prom FNP on 06/22/2010   Method used:   Faxed to ...       [facility] - [facility] (retail)       [address] [place], [place]  [address]       Ph: [phone]       Fax: [phone]   RxID:   [id number] LIPITOR 10 MG TABS (ATORVASTATIN CALCIUM) One tablet by mouth nightly for cholesterol  #30 x 11   Entered and Authorized by:   Lehman Prom FNP   Signed by:   Lehman Prom FNP on 06/22/2010   Method used:   Faxed to ...       [facility] - [facility] (retail)       [address] [place], [place]  [address]       Ph: [phone]  Fax: [phone]   RxID:   [id number] VALTREX 500 MG TABS (VALACYCLOVIR HCL) 1 tablet by mouth daily  #30 x 11   Entered and Authorized by:    Lehman Prom FNP   Signed by:   Lehman Prom FNP on 06/22/2010   Method used:   Faxed to ...       [facility] - [facility] (retail)       [address] [place], [place]  [address]       Ph: [phone]       Fax: [phone]   RxID:   [id number] LORATADINE 10 MG  TABS (LORATADINE) 1 tablet by mouth daily  #30 x 11   Entered and Authorized by:   Lehman Prom FNP   Signed by:   Lehman Prom FNP on 06/22/2010   Method used:   Faxed to ...       [facility] - [facility] (retail)       [address] [place], [place]  [address]       Ph: [phone]       Fax: [phone]   RxID:   [id number] NORVASC 10 MG  TABS (AMLODIPINE BESYLATE) 1 tablet by mouth daily for blood pressure  #30 x 11   Entered and Authorized by:   Lehman Prom FNP   Signed by:   Lehman Prom FNP on 06/22/2010   Method used:   Faxed to ...       [facility] - [facility] (retail)       [address] [place], [place]  [address]       Ph: [phone]       Fax: [phone]   RxID:   [id number] METFORMIN HCL 500 MG  TABS (METFORMIN HCL) 1 tablet by mouth two times a day for blood sugar  #60 x 11   Entered and Authorized by:   Lehman Prom FNP   Signed by:   Lehman Prom FNP on 06/22/2010   Method used:   Faxed to ...       [facility] - Pharmac (retail)       [address] [place], [place]  [address]       Ph: [phone]       Fax: [phone]   RxID:   [id number]    Orders Added: 1)  Hgb A1C [83036QW] 2)  UA Dipstick W/ Micro (manual) [81000] 3)  Capillary Blood Glucose/CBG [82948] 4)  Est. Patient age 15-39 [73] 5)  EKG w/ Interpretation [93000] 6)  KOH/ WET Mount [87210] 7)  Pap Smear, Thin Prep ( Collection of) [Q0091] 8)  T- GC Chlamydia [36144] 9)  Vision Screening [99173] 10)  T-Lipid Profile [80061-22930] 11)   T-Comprehensive Metabolic Panel [80053-22900] 12)  T-CBC w/Diff [31540-08676] 13)  Rapid HIV  [92370] 14)  T-Syphilis Test (RPR) [19509-32671] 15)  T-TSH [24580-99833] 16)  T-Urine Microalbumin w/creat. ratio [82043-[address]]    Laboratory Results   Urine Tests  Date/Time Received: June 22, 2010 9:02 AM   Routine Urinalysis   Color: lt. yellow Glucose: negative   (Normal Range: Negative) Bilirubin: negative   (Normal Range: Negative) Ketone: negative   (Normal Range: Negative) Spec. Gravity: >=1.030   (Normal Range: 1.003-1.035) Blood: negative   (Normal Range: Negative) pH: 5.5   (Normal Range: 5.0-8.0) Protein: negative   (  Normal Range: Negative) Urobilinogen: 0.2   (Normal Range: 0-1) Nitrite: negative   (Normal Range: Negative) Leukocyte Esterace: negative   (Normal Range: Negative)     Blood Tests   Date/Time Received: June 22, 2010 9:03 AM   HGBA1C: 6.2%   (Normal Range: Non-Diabetic - 3-6%   Control Diabetic - 6-8%) CBG Random:: 91  Date/Time Received: June 22, 2010 9:00 AM   Wet Mount/KOH Source: vaginal WBC/hpf: 1-5 Bacteria/hpf: rare Clue cells/hpf: none Yeast/hpf: none Trichomonas/hpf: none  Other Tests  Rapid HIV: negative      EKG  Procedure date:  06/22/2010  Findings:      SINUS RHYTHM  Laboratory Results   Urine Tests    Routine Urinalysis   Color: lt. yellow Glucose: negative   (Normal Range: Negative) Bilirubin: negative   (Normal Range: Negative) Ketone: negative   (Normal Range: Negative) Spec. Gravity: >=1.030   (Normal Range: 1.003-1.035) Blood: negative   (Normal Range: Negative) pH: 5.5   (Normal Range: 5.0-8.0) Protein: negative   (Normal Range: Negative) Urobilinogen: 0.2   (Normal Range: 0-1) Nitrite: negative   (Normal Range: Negative) Leukocyte Esterace: negative   (Normal Range: Negative)     Blood Tests     HGBA1C: 6.2%   (Normal Range: Non-Diabetic - 3-6%   Control Diabetic - 6-8%) CBG  Random:: 91mg /dL    DIRECTV KOH: Negative  Other Tests  Rapid HIV: negative

## 2013-12-27 ENCOUNTER — Ambulatory Visit: Payer: Self-pay

## 2014-08-20 DIAGNOSIS — G4733 Obstructive sleep apnea (adult) (pediatric): Secondary | ICD-10-CM | POA: Insufficient documentation

## 2014-08-20 DIAGNOSIS — Z6841 Body Mass Index (BMI) 40.0 and over, adult: Secondary | ICD-10-CM | POA: Insufficient documentation

## 2014-08-20 DIAGNOSIS — E119 Type 2 diabetes mellitus without complications: Secondary | ICD-10-CM | POA: Insufficient documentation

## 2014-08-20 DIAGNOSIS — E782 Mixed hyperlipidemia: Secondary | ICD-10-CM | POA: Insufficient documentation

## 2014-08-20 DIAGNOSIS — R0683 Snoring: Secondary | ICD-10-CM | POA: Insufficient documentation

## 2014-08-20 DIAGNOSIS — I10 Essential (primary) hypertension: Secondary | ICD-10-CM | POA: Insufficient documentation

## 2014-08-20 DIAGNOSIS — Z9989 Dependence on other enabling machines and devices: Secondary | ICD-10-CM | POA: Insufficient documentation

## 2014-09-17 ENCOUNTER — Ambulatory Visit: Payer: 59 | Attending: Otolaryngology

## 2014-09-17 DIAGNOSIS — G4733 Obstructive sleep apnea (adult) (pediatric): Secondary | ICD-10-CM | POA: Diagnosis not present

## 2014-09-17 DIAGNOSIS — R0683 Snoring: Secondary | ICD-10-CM | POA: Insufficient documentation

## 2014-10-01 ENCOUNTER — Ambulatory Visit: Payer: 59 | Attending: Otolaryngology

## 2014-10-01 DIAGNOSIS — G4733 Obstructive sleep apnea (adult) (pediatric): Secondary | ICD-10-CM | POA: Insufficient documentation

## 2014-10-01 DIAGNOSIS — R0683 Snoring: Secondary | ICD-10-CM | POA: Insufficient documentation

## 2015-01-22 ENCOUNTER — Encounter: Payer: Self-pay | Admitting: General Surgery

## 2015-02-16 ENCOUNTER — Ambulatory Visit (INDEPENDENT_AMBULATORY_CARE_PROVIDER_SITE_OTHER): Payer: 59 | Admitting: General Surgery

## 2015-02-16 ENCOUNTER — Encounter: Payer: Self-pay | Admitting: General Surgery

## 2015-02-16 VITALS — BP 138/74 | HR 76 | Resp 14 | Ht 64.0 in | Wt 264.0 lb

## 2015-02-16 DIAGNOSIS — R0789 Other chest pain: Secondary | ICD-10-CM

## 2015-02-16 DIAGNOSIS — M949 Disorder of cartilage, unspecified: Secondary | ICD-10-CM

## 2015-02-16 NOTE — Patient Instructions (Signed)
Patient to return as needed. 

## 2015-02-16 NOTE — Progress Notes (Signed)
Patient ID: Nicole Mendez, female   DOB: December 09, 1975, 39 y.o.   MRN: [id number]  Chief Complaint  Patient presents with  . Other    sternal mass    HPI Nicole Mendez is a 39 y.o. female here today for a evaluation of a mass on her left chest wall. She noticed this area about four months ago. The area was initially tender, but this resolved with the swelling itself. Area originally up to 3-4 cm in diameter. Back to baseline at this time. No history of trauma. HPI  Past Medical History  Diagnosis Date  . Diabetes mellitus without complication (HCC) 2000  . Hypertension     No past surgical history on file.  No family history on file.  Social History Social History  Substance Use Topics  . Smoking status: Never Smoker   . Smokeless tobacco: None  . Alcohol Use: 0.0 oz/week    0 Standard drinks or equivalent per week    No Known Allergies  Current Outpatient Prescriptions  Medication Sig Dispense Refill  . amLODipine (NORVASC) 10 MG tablet Take 10 mg by mouth daily.    . Ascorbic Acid (VITAMIN C) 100 MG tablet Take 100 mg by mouth daily.    Marland Kitchen. aspirin 81 MG tablet Take 81 mg by mouth daily.    . metFORMIN (GLUCOPHAGE) 500 MG tablet Take by mouth.     No current facility-administered medications for this visit.    Review of Systems Review of Systems  Constitutional: Negative.   Respiratory: Negative.   Cardiovascular: Negative.     Blood pressure 138/74, pulse 76, resp. rate 14, height 5\' 4"  (1.626 m), weight 264 lb (119.75 kg), last menstrual period 02/03/2015.  Physical Exam Physical Exam  Constitutional: She is oriented to person, place, and time. She appears well-developed and well-nourished.  Eyes: Conjunctivae are normal. No scleral icterus.  Neck: Neck supple.  Cardiovascular: Normal rate, regular rhythm and normal heart sounds.   Pulmonary/Chest: Effort normal and breath sounds normal.    No associated focal tenderness, bony abnormality or  asymmetry noted on clinical exam.  Lymphadenopathy:    She has no cervical adenopathy.  Neurological: She is alert and oriented to person, place, and time.  Skin: Skin is warm and dry.    Data Reviewed PCP notes dated 01/22/2015 describes 2 x 2 cm firm nonmobile mass on the xiphoid as well as tenderness in the adjacent rib area.  Assessment    Inflammation of the xiphoid, resolved.    Plan    No treatment is required. The patient was encouraged to call if she develops recurrent symptoms. No indication for imaging at this time.    Patient to return as needed. Ref. [place]  Earline MayotteByrnett, Calinda Stockinger W 02/16/2015, 2:35 PM

## 2015-04-14 ENCOUNTER — Ambulatory Visit: Payer: 59 | Admitting: Nurse Practitioner

## 2015-04-14 ENCOUNTER — Ambulatory Visit (INDEPENDENT_AMBULATORY_CARE_PROVIDER_SITE_OTHER): Payer: 59 | Admitting: Nurse Practitioner

## 2015-04-14 ENCOUNTER — Encounter: Payer: Self-pay | Admitting: Nurse Practitioner

## 2015-04-14 VITALS — BP 144/96 | HR 88 | Temp 98.9°F | Resp 16 | Ht 64.0 in | Wt 266.0 lb

## 2015-04-14 DIAGNOSIS — I10 Essential (primary) hypertension: Secondary | ICD-10-CM | POA: Diagnosis not present

## 2015-04-14 DIAGNOSIS — E119 Type 2 diabetes mellitus without complications: Secondary | ICD-10-CM

## 2015-04-14 DIAGNOSIS — S46812A Strain of other muscles, fascia and tendons at shoulder and upper arm level, left arm, initial encounter: Secondary | ICD-10-CM

## 2015-04-14 DIAGNOSIS — Z111 Encounter for screening for respiratory tuberculosis: Secondary | ICD-10-CM

## 2015-04-14 MED ORDER — CYCLOBENZAPRINE HCL 10 MG PO TABS: 10.0000 mg | ORAL_TABLET | Freq: Three times a day (TID) | ORAL | Status: DC | PRN

## 2015-04-14 NOTE — Patient Instructions (Addendum)
Take the muscle relaxer at night due to drowsiness- if taken during the day, Don't drive or operate heavy machinery.  Stretch your shoulders, arms, neck and warm compresses!   Follow up for a TB test reading on Thursday (make this appointment before leaving)   Nice to meet you! Welcome to [facility]!

## 2015-04-14 NOTE — Progress Notes (Signed)
Patient ID: Nicole Mendez, female    DOB: 1975-04-11  Age: 40 y.o. MRN: [id number]  CC: Establish Care and Neck Pain   HPI [name] presents for establishing care and CC of neck pain left side x 2 months.   1) New Pt Info:   Immunizations- UTD- need records for pneumonia vaccine   Mammogram- 8/16 [place]  Pap- 2016; [facility]   LMP- 04/01/15 lasted 7 days  2) Chronic Problems-  HTN, HLD, DM II, Arthritis- hands  DM- [facility] 2015 sept; Metformin 500 mg twice daily PO   BRCA- negative 04/30/14  3) Acute Problems-  Left neck tenderness   Onset- 2 months Location- left neck  Duration - intermittently to constant Characteristics- aching, pulling Aggravating factors- hunched over computer  Relieving factors- rest, stretching Severity- moderate No treatment to date No trauma  Forgot to take medication in last couple days   Also, need a TB test for work  History Nicole Mendez has a past medical history of Diabetes mellitus without complication ([place]) (7902); Hypertension; Arthritis; and Hyperlipidemia.   She has no past surgical history on file.   Her family history includes Alcohol abuse in her maternal grandmother; Cancer in her maternal grandmother; Diabetes in her maternal grandmother and mother; Hypertension in her maternal grandmother and mother.She reports that she has never smoked. She does not have any smokeless tobacco history on file. She reports that she drinks alcohol. She reports that she does not use illicit drugs.  Outpatient Prescriptions Prior to Visit  Medication Sig Dispense Refill  . amLODipine (NORVASC) 10 MG tablet Take 10 mg by mouth daily.    . Ascorbic Acid (VITAMIN C) 100 MG tablet Take 100 mg by mouth daily.    Marland Kitchen aspirin 81 MG tablet Take 81 mg by mouth daily.    . metFORMIN (GLUCOPHAGE) 500 MG tablet Take by mouth.     No facility-administered medications prior to visit.    ROS Review of Systems  Constitutional:  Negative for fever, chills, diaphoresis and fatigue.  Respiratory: Negative for chest tightness, shortness of breath and wheezing.   Cardiovascular: Negative for chest pain, palpitations and leg swelling.  Gastrointestinal: Negative for nausea, vomiting, diarrhea, constipation and rectal pain.  Musculoskeletal: Positive for myalgias and neck pain. Negative for back pain, joint swelling, arthralgias, gait problem and neck stiffness.  Skin: Negative for rash.  Neurological: Negative for dizziness, weakness, numbness and headaches.  Psychiatric/Behavioral: The patient is not nervous/anxious.     Objective:  BP 144/96 mmHg  Pulse 88  Temp(Src) 98.9 F (37.2 C) (Oral)  Resp 16  Ht '5\' 4"'  (1.626 m)  Wt 266 lb (120.657 kg)  BMI 45.64 kg/m2  SpO2 97%  LMP 03/25/2015  Physical Exam  Constitutional: She is oriented to person, place, and time. She appears well-developed and well-nourished. No distress.  HENT:  Head: Normocephalic and atraumatic.  Right Ear: External ear normal.  Left Ear: External ear normal.  Cardiovascular: Normal rate, regular rhythm and normal heart sounds.  Exam reveals no gallop and no friction rub.   No murmur heard. Pulmonary/Chest: Effort normal and breath sounds normal. No respiratory distress. She has no wheezes. She has no rales. She exhibits no tenderness.  Musculoskeletal: Normal range of motion. She exhibits tenderness. She exhibits no edema.  Trapezius tightness of left side  Neurological: She is alert and oriented to person, place, and time. No cranial nerve deficit. She exhibits normal muscle tone. Coordination normal.  Skin: Skin is  warm and dry. No rash noted. She is not diaphoretic.  Psychiatric: She has a normal mood and affect. Her behavior is normal. Judgment and thought content normal.      Assessment & Plan:   Nicole Mendez was seen today for establish care and neck pain.  Diagnoses and all orders for this visit:  Tuberculosis screening -      Cancel: TB Skin Test  Benign essential HTN  Type 2 diabetes mellitus without complication, without long-term current use of insulin ([place])  Morbid obesity, unspecified obesity type ([place])  Trapezius strain, left, initial encounter  Other orders -     cyclobenzaprine (FLEXERIL) 10 MG tablet; Take 1 tablet (10 mg total) by mouth 3 (three) times daily as needed for muscle spasms.   I am having Ms. Maricela Bo start on cyclobenzaprine. I am also having her maintain her metFORMIN, aspirin, vitamin C, amLODipine, and ustekinumab.  Meds ordered this encounter  Medications  . ustekinumab (STELARA) 90 MG/ML SOSY injection    Sig: every 3 (three) months.  . cyclobenzaprine (FLEXERIL) 10 MG tablet    Sig: Take 1 tablet (10 mg total) by mouth 3 (three) times daily as needed for muscle spasms.    Dispense:  30 tablet    Refill:  0    Order Specific Question:  Supervising Provider    Answer:  Crecencio Mc [[id number]]     Follow-up: Return in about 2 days (around 04/16/2015) for TB test reading and BP recheck.

## 2015-04-21 ENCOUNTER — Encounter: Payer: Self-pay | Admitting: Nurse Practitioner

## 2015-04-21 DIAGNOSIS — Z111 Encounter for screening for respiratory tuberculosis: Secondary | ICD-10-CM | POA: Insufficient documentation

## 2015-04-21 DIAGNOSIS — S46819A Strain of other muscles, fascia and tendons at shoulder and upper arm level, unspecified arm, initial encounter: Secondary | ICD-10-CM | POA: Insufficient documentation

## 2015-04-21 NOTE — Assessment & Plan Note (Signed)
New onset Trapezius strain on left stable Advised heat, flexeril at night advised not to drive or operate machinery  Massage advised FU in 4 weeks

## 2015-04-21 NOTE — Assessment & Plan Note (Signed)
Pt requested PPD testing for her job today. Unable to complete this due to inadequate stock. Pt reports she had one done at [facility] in past and will go there. Apologized.

## 2015-04-21 NOTE — Assessment & Plan Note (Addendum)
After establishing care today will obtain a diabetes follow up visit to review all maintenance measures. Labs will be done at [facility].

## 2015-04-21 NOTE — Assessment & Plan Note (Addendum)
BP Readings from Last 3 Encounters:  04/14/15 144/96  02/16/15 138/74  06/22/10 147/95   Has not taken her BP medication for a few days due to snow. She has it in the car and reports she will get it when she goes out. Will follow up in 4 weeks (asked her to return for BP check in 2 days)

## 2015-04-21 NOTE — Assessment & Plan Note (Signed)
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 She is not taking anything at this time. Advised working on [employer] and exercise. FU in 4 weeks

## 2015-05-12 ENCOUNTER — Encounter: Payer: Self-pay | Admitting: Nurse Practitioner

## 2015-05-14 ENCOUNTER — Encounter: Payer: Self-pay | Admitting: Nurse Practitioner

## 2015-05-14 ENCOUNTER — Ambulatory Visit (INDEPENDENT_AMBULATORY_CARE_PROVIDER_SITE_OTHER): Payer: 59 | Admitting: Nurse Practitioner

## 2015-05-14 VITALS — BP 142/82 | HR 97 | Temp 98.1°F | Resp 14 | Ht 64.0 in | Wt 257.0 lb

## 2015-05-14 DIAGNOSIS — E119 Type 2 diabetes mellitus without complications: Secondary | ICD-10-CM

## 2015-05-14 DIAGNOSIS — I10 Essential (primary) hypertension: Secondary | ICD-10-CM | POA: Diagnosis not present

## 2015-05-14 MED ORDER — METFORMIN HCL 500 MG PO TABS: 500.0000 mg | ORAL_TABLET | Freq: Two times a day (BID) | ORAL | Status: DC

## 2015-05-14 MED ORDER — LABETALOL HCL 100 MG PO TABS: 100.0000 mg | ORAL_TABLET | Freq: Two times a day (BID) | ORAL | Status: DC

## 2015-05-14 NOTE — Progress Notes (Signed)
Pre visit review using our clinic review tool, if applicable. No additional management support is needed unless otherwise documented below in the visit note. 

## 2015-05-14 NOTE — Progress Notes (Signed)
Patient ID: Nicole Mendez, female    DOB: 28-Aug-1975  Age: 40 y.o. MRN: [id number]  CC: Follow-up   HPI Nicole Mendez presents for CC of HTN/DM follow up.   1) HTN- Just got off of work and hasn't taken med today.   2) DM- Metformin   Last A1c 6.8  No formal exercise/diet Eye exam- few weeks ago   Gave [facility] form to get A1c and CMET due within 1 month  History Nicole Mendez has a past medical history of Diabetes mellitus without complication (HCC) (2000); Hypertension; Arthritis; and Hyperlipidemia.   Nicole Mendez has no past surgical history on file.   Nicole Mendez family history includes Alcohol abuse in Nicole Mendez maternal grandmother; Cancer in Nicole Mendez maternal grandmother; Diabetes in Nicole Mendez maternal grandmother and mother; Hypertension in Nicole Mendez maternal grandmother and mother.Nicole Mendez reports that Nicole Mendez has never smoked. Nicole Mendez does not have any smokeless tobacco history on file. Nicole Mendez reports that Nicole Mendez drinks alcohol. Nicole Mendez reports that Nicole Mendez does not use illicit drugs.  Outpatient Prescriptions Prior to Visit  Medication Sig Dispense Refill  . Ascorbic Acid (VITAMIN C) 100 MG tablet Take 100 mg by mouth daily.    Marland Kitchen aspirin 81 MG tablet Take 81 mg by mouth daily.    . cyclobenzaprine (FLEXERIL) 10 MG tablet Take 1 tablet (10 mg total) by mouth 3 (three) times daily as needed for muscle spasms. 30 tablet 0  . ustekinumab (STELARA) 90 MG/ML SOSY injection every 3 (three) months.    Marland Kitchen amLODipine (NORVASC) 10 MG tablet Take 10 mg by mouth daily.    . metFORMIN (GLUCOPHAGE) 500 MG tablet Take by mouth.     No facility-administered medications prior to visit.    ROS Review of Systems  Constitutional: Negative for fever, chills, diaphoresis and fatigue.  Respiratory: Negative for chest tightness, shortness of breath and wheezing.   Cardiovascular: Negative for chest pain, palpitations and leg swelling.  Gastrointestinal: Negative for nausea, vomiting and diarrhea.  Skin: Negative for rash.  Neurological: Negative  for dizziness, weakness, numbness and headaches.  Psychiatric/Behavioral: The patient is not nervous/anxious.     Objective:  BP 142/82 mmHg  Pulse 97  Temp(Src) 98.1 F (36.7 C) (Oral)  Resp 14  Ht  (1.626 m)  Wt 257 lb (116.574 kg)  BMI 44.09 kg/m2  SpO2 99%  LMP 03/25/2015  Physical Exam  Constitutional: Nicole Mendez is oriented to person, place, and time. Nicole Mendez appears well-developed and well-nourished. No distress.  HENT:  Head: Normocephalic and atraumatic.  Right Ear: External ear normal.  Left Ear: External ear normal.  Mouth/Throat: No oropharyngeal exudate.  Eyes: EOM are normal. Pupils are equal, round, and reactive to light. Right eye exhibits no discharge. Left eye exhibits no discharge. No scleral icterus.  Glasses  Cardiovascular: Normal rate, regular rhythm, normal heart sounds and intact distal pulses.  Exam reveals no gallop and no friction rub.   No murmur heard. Pulmonary/Chest: Effort normal and breath sounds normal. No respiratory distress. Nicole Mendez has no wheezes. Nicole Mendez has no rales. Nicole Mendez exhibits no tenderness.  Neurological: Nicole Mendez is alert and oriented to person, place, and time. Coordination normal.  Monofilament exam 10/10 bilaterally  Skin: Skin is warm and dry. No rash noted. Nicole Mendez is not diaphoretic.  Psychiatric: Nicole Mendez has a normal mood and affect. Nicole Mendez behavior is normal. Judgment and thought content normal.   Assessment & Plan:   Nicole Mendez was seen today for follow-up.  Diagnoses and all orders for this visit:  Benign essential HTN  Type  2 diabetes mellitus without complication, without long-term current use of insulin (HCC)  Other orders -     metFORMIN (GLUCOPHAGE) 500 MG tablet; Take 1 tablet (500 mg total) by mouth 2 (two) times daily with a meal. -     labetalol (NORMODYNE) 100 MG tablet; Take 1 tablet (100 mg total) by mouth 2 (two) times daily.  I have discontinued Nicole Mendez's amLODipine. I have also changed Nicole Mendez metFORMIN. Additionally, I am having  Nicole Mendez start on labetalol. Lastly, I am having Nicole Mendez maintain Nicole Mendez aspirin, vitamin C, ustekinumab, and cyclobenzaprine.  Meds ordered this encounter  Medications  . metFORMIN (GLUCOPHAGE) 500 MG tablet    Sig: Take 1 tablet (500 mg total) by mouth 2 (two) times daily with a meal.    Dispense:  180 tablet    Refill:  1    Order Specific Question:  Supervising Provider    Answer:  Duncan Dull L [[id number]]  . labetalol (NORMODYNE) 100 MG tablet    Sig: Take 1 tablet (100 mg total) by mouth 2 (two) times daily.    Dispense:  180 tablet    Refill:  1    Order Specific Question:  Supervising Provider    Answer:  Sherlene Shams [[id number]]     Follow-up: Return in about 6 months (around 11/11/2015) for Follow up .

## 2015-05-14 NOTE — Patient Instructions (Signed)
Follow up in 6 months.   Good to see you today!

## 2015-05-17 NOTE — Assessment & Plan Note (Signed)
BP Readings from Last 3 Encounters:  05/14/15 142/82  04/14/15 144/96  02/16/15 138/74   Slightly elevated today. Pt reports usually stable, but has not taken her medications today as she has gotten off of night shift. Will not change anything today.  [facility] form to have A1c and CMET drawn Recent Eye exam

## 2015-05-17 NOTE — Assessment & Plan Note (Signed)
Stable Pt on Metformin- refilled  Encouraged healthy diet and exercise  Eye exam is up-to-date Foot exam performed today  We'll obtain labs from [facility]

## 2015-06-11 ENCOUNTER — Ambulatory Visit (INDEPENDENT_AMBULATORY_CARE_PROVIDER_SITE_OTHER): Payer: 59 | Admitting: Nurse Practitioner

## 2015-06-11 ENCOUNTER — Encounter: Payer: Self-pay | Admitting: Nurse Practitioner

## 2015-06-11 DIAGNOSIS — S46812A Strain of other muscles, fascia and tendons at shoulder and upper arm level, left arm, initial encounter: Secondary | ICD-10-CM | POA: Diagnosis not present

## 2015-06-11 MED ORDER — PREDNISONE 10 MG PO TABS: ORAL_TABLET | ORAL | Status: DC

## 2015-06-11 NOTE — Progress Notes (Signed)
Patient ID: Nicole Mendez, female    DOB: 29-Mar-1976  Age: 40 y.o. MRN: [id number]  CC: Neck Pain and Shoulder Pain   HPI Nicole Mendez presents for CC of neck pain and shoulder pain x 8 months.   Onset- left side at first and now on right side, feb. On right  Location- right from side of neck to fingers Duration- constant ache Characteristics- aching Aggravating factors- Nothing noticed Relieving factors- Advil  Severity- 10/10 worst, 6/10 best   Treatment to date:  Flexeril- at night not helpful  Bengay- all the time not helpful  Ice/Heat- intermittently   Not happened before   Denies numbness, occasional tingling   History Nicole OdeaLatisha has a past medical history of Diabetes mellitus without complication (HCC) (2000); Hypertension; Arthritis; and Hyperlipidemia.   Nicole Mendez has no past surgical history on file.   Nicole Mendez family history includes Alcohol abuse in Nicole Mendez maternal grandmother; Cancer in Nicole Mendez maternal grandmother; Diabetes in Nicole Mendez maternal grandmother and mother; Hypertension in Nicole Mendez maternal grandmother and mother.Nicole Mendez reports that Nicole Mendez has never smoked. Nicole Mendez does not have any smokeless tobacco history on file. Nicole Mendez reports that Nicole Mendez drinks alcohol. Nicole Mendez reports that Nicole Mendez does not use illicit drugs.  Outpatient Prescriptions Prior to Visit  Medication Sig Dispense Refill  . Ascorbic Acid (VITAMIN C) 100 MG tablet Take 100 mg by mouth daily.    Marland Kitchen. aspirin 81 MG tablet Take 81 mg by mouth daily.    . cyclobenzaprine (FLEXERIL) 10 MG tablet Take 1 tablet (10 mg total) by mouth 3 (three) times daily as needed for muscle spasms. 30 tablet 0  . labetalol (NORMODYNE) 100 MG tablet Take 1 tablet (100 mg total) by mouth 2 (two) times daily. 180 tablet 1  . metFORMIN (GLUCOPHAGE) 500 MG tablet Take 1 tablet (500 mg total) by mouth 2 (two) times daily with a meal. 180 tablet 1  . ustekinumab (STELARA) 90 MG/ML SOSY injection every 3 (three) months.     No facility-administered medications  prior to visit.    ROS Review of Systems  Constitutional: Negative for fever, chills, diaphoresis and fatigue.  Respiratory: Negative for chest tightness, shortness of breath and wheezing.   Cardiovascular: Negative for chest pain, palpitations and leg swelling.  Gastrointestinal: Negative for nausea, vomiting and diarrhea.  Musculoskeletal: Positive for myalgias and neck pain. Negative for back pain, joint swelling, arthralgias, gait problem and neck stiffness.  Skin: Negative for rash.  Neurological: Negative for dizziness, weakness and headaches.  Psychiatric/Behavioral: The patient is not nervous/anxious.     Objective:  BP 138/76 mmHg  Pulse 95  Temp(Src) 98.5 F (36.9 C) (Oral)  Resp 16  Ht 5\' 4"  (1.626 m)  Wt 260 lb 12.8 oz (118.298 kg)  BMI 44.74 kg/m2  SpO2 96%  LMP 06/08/2015  Physical Exam  Constitutional: Nicole Mendez is oriented to person, place, and time. Nicole Mendez appears well-developed and well-nourished. No distress.  HENT:  Head: Normocephalic and atraumatic.  Right Ear: External ear normal.  Left Ear: External ear normal.  Cardiovascular: Normal rate and regular rhythm.   Pulmonary/Chest: Effort normal and breath sounds normal. No respiratory distress. Nicole Mendez has no wheezes. Nicole Mendez has no rales. Nicole Mendez exhibits no tenderness.  Neurological: Nicole Mendez is alert and oriented to person, place, and time. No cranial nerve deficit. Nicole Mendez exhibits normal muscle tone. Coordination normal.  Deltoid 5/5 Bilateral, Biceps 5/5 bilateral, Wrist extensors 5/5 bilateral, Triceps 5/5 bilateral, finger flexors and abductors 5/5 bilateral, grip 5/5 bilateraly no Hoffman's, intact heel/toe/sequential walking,  sensation intact upper and lower extremities. Straight leg raise negative bilaterally. DTR's upper and lower 2+   Skin: Skin is warm and dry. No rash noted. Nicole Mendez is not diaphoretic.  Psychiatric: Nicole Mendez has a normal mood and affect. Nicole Mendez behavior is normal. Judgment and thought content normal.   Assessment &  Plan:   Nicole Mendez was seen today for neck pain and shoulder pain.  Diagnoses and all orders for this visit:  Morbid obesity, unspecified obesity type (HCC)  Trapezius strain, left, initial encounter  Other orders -     predniSONE (DELTASONE) 10 MG tablet; Take 6 tablets by mouth on day 1 with breakfast then decrease by 1 tablet each day until gone.   I am having Nicole Mendez start on predniSONE. I am also having Nicole Mendez maintain Nicole Mendez aspirin, vitamin C, ustekinumab, cyclobenzaprine, metFORMIN, labetalol, and phentermine.  Meds ordered this encounter  Medications  . phentermine (ADIPEX-P) 37.5 MG tablet    Sig: TK 1 T PO D    Refill:  0  . predniSONE (DELTASONE) 10 MG tablet    Sig: Take 6 tablets by mouth on day 1 with breakfast then decrease by 1 tablet each day until gone.    Dispense:  21 tablet    Refill:  0    Order Specific Question:  Supervising Provider    Answer:  Sherlene Shams [[id number]]     Follow-up: Return if symptoms worsen or fail to improve.

## 2015-06-11 NOTE — Patient Instructions (Signed)
Prednisone with breakfast or lunch at the latest.  6 tablets on day 1, 5 tablets on day 2, 4 tablets on day 3, 3 tablets on day 4, 2 tablets day 5, 1 tablet on day 6...done! Take tablets all together not spaced out Don't take with NSAIDs (Ibuprofen, Aleve, Naproxen, Meloxicam ect...) 

## 2015-06-20 NOTE — Assessment & Plan Note (Signed)
Wt Readings from Last 3 Encounters:  06/11/15 260 lb 12.8 oz (118.298 kg)  05/14/15 257 lb (116.574 kg)  04/14/15 266 lb (120.657 kg)   Dr. Tiburcio PeaHarris is prescribing phentermine for patient Advised that weight loss will help with some of these concerns the patient also needs to remain active

## 2015-06-20 NOTE — Assessment & Plan Note (Signed)
Established problem worsening Patient is taking Flexeril, OTC BenGay, ice and heat intermittently without relief patient has not done physical therapy in past Patient is not due for stelara at this time We'll try prednisone taper Discussed how to take with written instructions on AVS Follow-up if worsening or failure to improve

## 2015-07-13 ENCOUNTER — Encounter: Payer: Self-pay | Admitting: Nurse Practitioner

## 2015-07-13 ENCOUNTER — Ambulatory Visit (INDEPENDENT_AMBULATORY_CARE_PROVIDER_SITE_OTHER): Payer: 59 | Admitting: Nurse Practitioner

## 2015-07-13 VITALS — BP 152/80 | HR 105 | Temp 98.1°F | Ht 64.0 in | Wt 259.0 lb

## 2015-07-13 DIAGNOSIS — M25511 Pain in right shoulder: Secondary | ICD-10-CM | POA: Diagnosis not present

## 2015-07-13 DIAGNOSIS — M5412 Radiculopathy, cervical region: Secondary | ICD-10-CM | POA: Diagnosis not present

## 2015-07-13 MED ORDER — GABAPENTIN 300 MG PO CAPS: 300.0000 mg | ORAL_CAPSULE | Freq: Every day | ORAL | Status: DC

## 2015-07-13 NOTE — Patient Instructions (Signed)
[facility] [address] [place], [place] [address] Hours of Operation Monday - Friday, 8 a.m. - 5 p.m. Main: [phone]   

## 2015-07-13 NOTE — Progress Notes (Signed)
Pre visit review using our clinic review tool, if applicable. No additional management support is needed unless otherwise documented below in the visit note. 

## 2015-07-13 NOTE — Progress Notes (Signed)
Patient ID: Nicole Mendez, female    DOB: [birth year]  Age: 40 y.o. MRN: [id number]  CC: No chief complaint on file.   HPI Nicole Mendez presents for follow of right shoulder   1) Onset- 1 yr  Location- Right shoulder with radiation to thumb/pointer finger of right hand Duration - daily, constant Characteristics- Nagging/Aching, numbness Aggravating factors- use of right arm/ neck Relieving factors- rest  Severity- Moderate    LMP- 07/05/15 normal for pt  History Nicole Mendez has a past medical history of Diabetes mellitus without complication (HCC) (2000); Hypertension; Arthritis; and Hyperlipidemia.   She has no past surgical history on file.   Her family history includes Alcohol abuse in her maternal grandmother; Cancer in her maternal grandmother; Diabetes in her maternal grandmother and mother; Hypertension in her maternal grandmother and mother.She reports that she has never smoked. She does not have any smokeless tobacco history on file. She reports that she drinks alcohol. She reports that she does not use illicit drugs.  Outpatient Prescriptions Prior to Visit  Medication Sig Dispense Refill  . Ascorbic Acid (VITAMIN C) 100 MG tablet Take 100 mg by mouth daily.    Marland Kitchen. aspirin 81 MG tablet Take 81 mg by mouth daily.    . cyclobenzaprine (FLEXERIL) 10 MG tablet Take 1 tablet (10 mg total) by mouth 3 (three) times daily as needed for muscle spasms. 30 tablet 0  . labetalol (NORMODYNE) 100 MG tablet Take 1 tablet (100 mg total) by mouth 2 (two) times daily. 180 tablet 1  . metFORMIN (GLUCOPHAGE) 500 MG tablet Take 1 tablet (500 mg total) by mouth 2 (two) times daily with a meal. 180 tablet 1  . phentermine (ADIPEX-P) 37.5 MG tablet TK 1 T PO D  0  . ustekinumab (STELARA) 90 MG/ML SOSY injection every 3 (three) months.    . predniSONE (DELTASONE) 10 MG tablet Take 6 tablets by mouth on day 1 with breakfast then decrease by 1 tablet each day until gone. (Patient not taking:  Reported on 07/13/2015) 21 tablet 0   No facility-administered medications prior to visit.    ROS Review of Systems  Constitutional: Negative for fever, chills, diaphoresis and fatigue.  Musculoskeletal: Positive for myalgias, arthralgias and neck pain. Negative for back pain, joint swelling, gait problem and neck stiffness.    Objective:  BP 152/80 mmHg  Pulse 105  Temp(Src) 98.1 F (36.7 C) (Oral)  Ht 5\' 4"  (1.626 m)  Wt 259 lb (117.482 kg)  BMI 44.44 kg/m2  SpO2 95%  Physical Exam  Constitutional: She is oriented to person, place, and time. She appears well-developed and well-nourished. No distress.  HENT:  Head: Normocephalic and atraumatic.  Right Ear: External ear normal.  Left Ear: External ear normal.  Cardiovascular: Normal rate, regular rhythm and normal heart sounds.   Pulmonary/Chest: Effort normal and breath sounds normal. No respiratory distress. She has no wheezes. She has no rales. She exhibits no tenderness.  Musculoskeletal: Normal range of motion. She exhibits tenderness. She exhibits no edema.  Neurological: She is alert and oriented to person, place, and time. No cranial nerve deficit. She exhibits normal muscle tone. Coordination normal.  Deltoid 5/5 Bilateral, Biceps 5/5 bilateral, Wrist extensors 5/5 bilateral, Triceps 5/5 bilateral, finger flexors and abductors 5/5 bilateral, grip 5/5 bilateraly no Hoffman's, intact heel/toe/sequential walking, sensation intact upper and lower extremities. Straight leg raise negative bilaterally. DTR's upper and lower 2+   Skin: Skin is warm and dry. No rash noted. She is  not diaphoretic.  Psychiatric: She has a normal mood and affect. Her behavior is normal. Judgment and thought content normal.      Assessment & Plan:   Diagnoses and all orders for this visit:  Right shoulder pain -     DG Cervical Spine Complete; Future -     DG Shoulder Right; Future  Cervical radiculopathy at C6 -     DG Cervical Spine  Complete; Future -     DG Shoulder Right; Future  Other orders -     gabapentin (NEURONTIN) 300 MG capsule; Take 1 capsule (300 mg total) by mouth at bedtime.   I have discontinued Ms. Kuhnle's predniSONE. I am also having her start on gabapentin. Additionally, I am having her maintain her aspirin, vitamin C, ustekinumab, cyclobenzaprine, metFORMIN, labetalol, and phentermine.  Meds ordered this encounter  Medications  . gabapentin (NEURONTIN) 300 MG capsule    Sig: Take 1 capsule (300 mg total) by mouth at bedtime.    Dispense:  30 capsule    Refill:  1    Order Specific Question:  Supervising Provider    Answer:  Sherlene Shams [[id number]]     Follow-up: Return if symptoms worsen or fail to improve.

## 2015-07-19 DIAGNOSIS — M5412 Radiculopathy, cervical region: Secondary | ICD-10-CM | POA: Insufficient documentation

## 2015-07-19 DIAGNOSIS — M25511 Pain in right shoulder: Secondary | ICD-10-CM | POA: Insufficient documentation

## 2015-07-19 DIAGNOSIS — G8929 Other chronic pain: Secondary | ICD-10-CM | POA: Insufficient documentation

## 2015-07-19 NOTE — Assessment & Plan Note (Signed)
Xray of Right shoulder to exclude arthritic conditions Continue current regimen Adding Gabapentin to see if helpful

## 2015-07-19 NOTE — Assessment & Plan Note (Addendum)
Following C5-6 pattern X-ray of cervical spine Trial of 1 QHS cap of Gabapentin  FU after results

## 2015-08-19 ENCOUNTER — Encounter: Payer: Self-pay | Admitting: Family Medicine

## 2015-08-19 ENCOUNTER — Ambulatory Visit (INDEPENDENT_AMBULATORY_CARE_PROVIDER_SITE_OTHER): Payer: 59 | Admitting: Family Medicine

## 2015-08-19 VITALS — BP 170/100 | HR 102 | Temp 98.7°F | Ht 64.0 in | Wt 256.0 lb

## 2015-08-19 DIAGNOSIS — I10 Essential (primary) hypertension: Secondary | ICD-10-CM | POA: Diagnosis not present

## 2015-08-19 DIAGNOSIS — T148 Other injury of unspecified body region: Secondary | ICD-10-CM | POA: Diagnosis not present

## 2015-08-19 DIAGNOSIS — W57XXXA Bitten or stung by nonvenomous insect and other nonvenomous arthropods, initial encounter: Secondary | ICD-10-CM | POA: Diagnosis not present

## 2015-08-19 NOTE — Assessment & Plan Note (Signed)
New problem. No evidence of infection or tick borne illness. Advised supportive care. No indication for further testing or antibiotics at this time.

## 2015-08-19 NOTE — Assessment & Plan Note (Addendum)
Established problem, acute worsening. Exam unremarkable. Patient noncompliant with medications. Advised compliance with labetalol.

## 2015-08-19 NOTE — Patient Instructions (Signed)
No need to worry about your tick bite at this time. Let me know if you develop rash, fever, chills, etc.  Stop the phentermine.  Please take your labetalol.  [facility]  Take care  Dr. Adriana Simasook

## 2015-08-19 NOTE — Progress Notes (Signed)
Subjective:  Patient ID: Nicole Mendez, female    DOB: 12-04-1975  Age: 40 y.o. MRN: [id number]  CC: Tick bite, Elevated BP  HPI:  40 year old female with HTN, Morbid obesity, DM-2 presents with the above complaints.  Tick bite  Patient suffered tick bite on Sunday.  Patient was bit on the right posterior shoulder.  Since then she's had no associated fever or chills. No rash. She is otherwise feeling well.  She is concerned however about tick borne illness.  HTN  BP markedly elevated today.  Patient is noncompliant with her medication. She is supposed to be on labetalol as she is attempting to have a child.  Social Hx   Social History   Social History  . Marital Status: Single    Spouse Name: N/A  . Number of Children: N/A  . Years of Education: N/A   Social History Main Topics  . Smoking status: Never Smoker   . Smokeless tobacco: None  . Alcohol Use: 0.0 oz/week    0 Standard drinks or equivalent per week  . Drug Use: No  . Sexual Activity:    Partners: Male     Comment: 1 partner   Other Topics Concern  . None   Social History Narrative   Single   Employed as a Engineer, miningLab Assistant at [employer]   Associates Degree   No children    No caffeine   Review of Systems  Constitutional: Negative.   Skin:       Tick bite.   Objective:  BP 170/100 mmHg  Pulse 102  Temp(Src) 98.7 F (37.1 C) (Oral)  Ht 5\' 4"  (1.626 m)  Wt 256 lb (116.121 kg)  BMI 43.92 kg/m2  SpO2 97%  BP/Weight 08/19/2015 07/13/2015 06/11/2015  Systolic BP 170 152 138  Diastolic BP 100 80 76  Wt. (Lbs) 256 259 260.8  BMI 43.92 44.44 44.74   Physical Exam  Constitutional: She is oriented to person, place, and time. She appears well-developed. No distress.  Cardiovascular: Normal rate and regular rhythm.   Pulmonary/Chest: Effort normal and breath sounds normal.  Neurological: She is alert and oriented to person, place, and time.  Skin:  Small erythematous papule with central eschar  noted on the right posterior shoulder/trapezius region.  Psychiatric: She has a normal mood and affect.  Vitals reviewed.  Lab Results  Component Value Date   WBC 9.6 04/17/2009   HGB 11.0* 04/17/2009   HCT 35.9* 04/17/2009   PLT 481* 04/17/2009   GLUCOSE 80 04/17/2009   CHOL 118 04/20/2009   TRIG 58 04/20/2009   HDL 47 04/20/2009   LDLCALC 59 04/20/2009   ALT 12 04/17/2009   AST 13 04/17/2009   NA 141 04/17/2009   K 4.3 04/17/2009   CL 104 04/17/2009   CREATININE 0.55 04/17/2009   BUN 8 04/17/2009   CO2 23 04/17/2009   TSH 2.346 04/17/2009   HGBA1C 6.2 06/22/2010   MICROALBUR 0.83 04/21/2009   Assessment & Plan:   Problem List Items Addressed This Visit    Tick bite - Primary    New problem. No evidence of infection or tick borne illness. Advised supportive care. No indication for further testing or antibiotics at this time.      Benign essential HTN    Established problem, acute worsening. Exam unremarkable. Patient noncompliant with medications. Advised compliance with labetalol.        Follow-up: PRN  Everlene OtherJayce Jamiee Milholland DO [facility] [place] [facility]

## 2015-08-20 ENCOUNTER — Observation Stay (HOSPITAL_COMMUNITY): Payer: 59

## 2015-08-20 ENCOUNTER — Telehealth: Payer: Self-pay | Admitting: Nurse Practitioner

## 2015-08-20 ENCOUNTER — Encounter (HOSPITAL_COMMUNITY): Payer: Self-pay | Admitting: *Deleted

## 2015-08-20 ENCOUNTER — Inpatient Hospital Stay (HOSPITAL_COMMUNITY)
Admission: EM | Admit: 2015-08-20 | Discharge: 2015-08-25 | DRG: 872 | Disposition: A | Discharge status: Home / Self Care | Payer: 59 | Attending: Internal Medicine | Admitting: Internal Medicine

## 2015-08-20 DIAGNOSIS — E669 Obesity, unspecified: Secondary | ICD-10-CM | POA: Diagnosis present

## 2015-08-20 DIAGNOSIS — I10 Essential (primary) hypertension: Secondary | ICD-10-CM | POA: Diagnosis present

## 2015-08-20 DIAGNOSIS — S30860A Insect bite (nonvenomous) of lower back and pelvis, initial encounter: Secondary | ICD-10-CM | POA: Insufficient documentation

## 2015-08-20 DIAGNOSIS — R109 Unspecified abdominal pain: Secondary | ICD-10-CM | POA: Diagnosis present

## 2015-08-20 DIAGNOSIS — R519 Headache, unspecified: Secondary | ICD-10-CM | POA: Diagnosis present

## 2015-08-20 DIAGNOSIS — Z9989 Dependence on other enabling machines and devices: Secondary | ICD-10-CM | POA: Diagnosis present

## 2015-08-20 DIAGNOSIS — Z6841 Body Mass Index (BMI) 40.0 and over, adult: Secondary | ICD-10-CM

## 2015-08-20 DIAGNOSIS — G8929 Other chronic pain: Secondary | ICD-10-CM | POA: Diagnosis present

## 2015-08-20 DIAGNOSIS — R1033 Periumbilical pain: Secondary | ICD-10-CM

## 2015-08-20 DIAGNOSIS — G4733 Obstructive sleep apnea (adult) (pediatric): Secondary | ICD-10-CM | POA: Diagnosis present

## 2015-08-20 DIAGNOSIS — F1010 Alcohol abuse, uncomplicated: Secondary | ICD-10-CM | POA: Diagnosis not present

## 2015-08-20 DIAGNOSIS — E876 Hypokalemia: Secondary | ICD-10-CM | POA: Diagnosis present

## 2015-08-20 DIAGNOSIS — R05 Cough: Secondary | ICD-10-CM | POA: Insufficient documentation

## 2015-08-20 DIAGNOSIS — Z7984 Long term (current) use of oral hypoglycemic drugs: Secondary | ICD-10-CM

## 2015-08-20 DIAGNOSIS — K047 Periapical abscess without sinus: Secondary | ICD-10-CM | POA: Diagnosis present

## 2015-08-20 DIAGNOSIS — E785 Hyperlipidemia, unspecified: Secondary | ICD-10-CM | POA: Diagnosis present

## 2015-08-20 DIAGNOSIS — R51 Headache: Secondary | ICD-10-CM | POA: Diagnosis present

## 2015-08-20 DIAGNOSIS — W57XXXA Bitten or stung by nonvenomous insect and other nonvenomous arthropods, initial encounter: Secondary | ICD-10-CM

## 2015-08-20 DIAGNOSIS — R509 Fever, unspecified: Secondary | ICD-10-CM | POA: Diagnosis present

## 2015-08-20 DIAGNOSIS — Z79899 Other long term (current) drug therapy: Secondary | ICD-10-CM

## 2015-08-20 DIAGNOSIS — S40261A Insect bite (nonvenomous) of right shoulder, initial encounter: Secondary | ICD-10-CM | POA: Diagnosis present

## 2015-08-20 DIAGNOSIS — A419 Sepsis, unspecified organism: Secondary | ICD-10-CM | POA: Diagnosis present

## 2015-08-20 DIAGNOSIS — R059 Cough, unspecified: Secondary | ICD-10-CM | POA: Insufficient documentation

## 2015-08-20 DIAGNOSIS — G039 Meningitis, unspecified: Secondary | ICD-10-CM

## 2015-08-20 DIAGNOSIS — L409 Psoriasis, unspecified: Secondary | ICD-10-CM | POA: Diagnosis present

## 2015-08-20 DIAGNOSIS — Z7982 Long term (current) use of aspirin: Secondary | ICD-10-CM

## 2015-08-20 DIAGNOSIS — E119 Type 2 diabetes mellitus without complications: Secondary | ICD-10-CM

## 2015-08-20 DIAGNOSIS — Z9114 Patient's other noncompliance with medication regimen: Secondary | ICD-10-CM

## 2015-08-20 DIAGNOSIS — M1990 Unspecified osteoarthritis, unspecified site: Secondary | ICD-10-CM | POA: Diagnosis present

## 2015-08-20 DIAGNOSIS — Z720 Tobacco use: Secondary | ICD-10-CM | POA: Diagnosis present

## 2015-08-20 DIAGNOSIS — F17210 Nicotine dependence, cigarettes, uncomplicated: Secondary | ICD-10-CM | POA: Diagnosis present

## 2015-08-20 HISTORY — DX: Alcohol abuse, uncomplicated: F10.10

## 2015-08-20 HISTORY — DX: Obstructive sleep apnea (adult) (pediatric): G47.33

## 2015-08-20 HISTORY — DX: Psoriasis, unspecified: L40.9

## 2015-08-20 HISTORY — DX: Tobacco use: Z72.0

## 2015-08-20 HISTORY — DX: Morbid (severe) obesity due to excess calories: E66.01

## 2015-08-20 LAB — COMPREHENSIVE METABOLIC PANEL
ALT: 19 U/L (ref 14–54)
ANION GAP: 9 (ref 5–15)
AST: 31 U/L (ref 15–41)
Albumin: 3.7 g/dL (ref 3.5–5.0)
Alkaline Phosphatase: 73 U/L (ref 38–126)
BUN: 9 mg/dL (ref 6–20)
CHLORIDE: 101 mmol/L (ref 101–111)
CO2: 23 mmol/L (ref 22–32)
CREATININE: 0.79 mg/dL (ref 0.44–1.00)
Calcium: 8.1 mg/dL — ABNORMAL LOW (ref 8.9–10.3)
GFR calc non Af Amer: >60 mL/min (ref >60)
Glucose, Bld: 92 mg/dL (ref 65–99)
POTASSIUM: 2.9 mmol/L — AB (ref 3.5–5.1)
SODIUM: 133 mmol/L — AB (ref 135–145)
Total Bilirubin: 0.4 mg/dL (ref 0.3–1.2)
Total Protein: 7 g/dL (ref 6.5–8.1)

## 2015-08-20 LAB — CBC WITH DIFFERENTIAL/PLATELET
Basophils Absolute: 0 10*3/uL (ref 0.0–0.1)
Basophils Relative: 1 %
Eosinophils Absolute: 0 10*3/uL (ref 0.0–0.7)
Eosinophils Relative: 0 %
HCT: 35.9 % — ABNORMAL LOW (ref 36.0–46.0)
Hemoglobin: 11 g/dL — ABNORMAL LOW (ref 12.0–15.0)
LYMPHS ABS: 0.6 10*3/uL — AB (ref 0.7–4.0)
LYMPHS PCT: 15 %
MCH: 24.7 pg — ABNORMAL LOW (ref 26.0–34.0)
MCHC: 30.6 g/dL (ref 30.0–36.0)
MCV: 80.7 fL (ref 78.0–100.0)
MONOS PCT: 7 %
Monocytes Absolute: 0.3 10*3/uL (ref 0.1–1.0)
NEUTROS ABS: 2.9 10*3/uL (ref 1.7–7.7)
NEUTROS PCT: 77 %
PLATELETS: 337 10*3/uL (ref 150–400)
RBC: 4.45 MIL/uL (ref 3.87–5.11)
RDW: 15.3 % (ref 11.5–15.5)
WBC: 3.8 10*3/uL — AB (ref 4.0–10.5)

## 2015-08-20 LAB — PROTIME-INR
INR: 1.32 (ref 0.00–1.49)
PROTHROMBIN TIME: 16 s — AB (ref 11.6–15.2)

## 2015-08-20 LAB — URINALYSIS, ROUTINE W REFLEX MICROSCOPIC
Glucose, UA: NEGATIVE mg/dL
Hgb urine dipstick: NEGATIVE
KETONES UR: NEGATIVE mg/dL
NITRITE: NEGATIVE
PROTEIN: 30 mg/dL — AB
Specific Gravity, Urine: 1.044 — ABNORMAL HIGH (ref 1.005–1.030)
pH: 6 (ref 5.0–8.0)

## 2015-08-20 LAB — URINE MICROSCOPIC-ADD ON

## 2015-08-20 LAB — APTT: aPTT: 35 seconds (ref 24–37)

## 2015-08-20 MED ORDER — LABETALOL HCL 100 MG PO TABS
100.0000 mg | ORAL_TABLET | Freq: Two times a day (BID) | ORAL | Status: DC
  Administered 2015-08-21 – 2015-08-25 (×10): 100 mg via ORAL
  Filled 2015-08-20 (×10): qty 1

## 2015-08-20 MED ORDER — VITAMIN C 500 MG PO TABS
250.0000 mg | ORAL_TABLET | Freq: Every day | ORAL | Status: DC
  Administered 2015-08-21 – 2015-08-25 (×5): 250 mg via ORAL
  Filled 2015-08-20 (×5): qty 1

## 2015-08-20 MED ORDER — DOXYCYCLINE HYCLATE 100 MG IV SOLR
100.0000 mg | Freq: Once | INTRAVENOUS | Status: AC
  Administered 2015-08-20: 100 mg via INTRAVENOUS
  Filled 2015-08-20: qty 100

## 2015-08-20 MED ORDER — FOLIC ACID 1 MG PO TABS
1.0000 mg | ORAL_TABLET | Freq: Every day | ORAL | Status: DC
  Administered 2015-08-21 – 2015-08-25 (×5): 1 mg via ORAL
  Filled 2015-08-20 (×5): qty 1

## 2015-08-20 MED ORDER — THIAMINE HCL 100 MG/ML IJ SOLN
100.0000 mg | Freq: Every day | INTRAMUSCULAR | Status: DC
  Filled 2015-08-20: qty 2

## 2015-08-20 MED ORDER — SODIUM CHLORIDE 0.9 % IV SOLN
1000.0000 mL | Freq: Once | INTRAVENOUS | Status: AC
  Administered 2015-08-20: 1000 mL via INTRAVENOUS

## 2015-08-20 MED ORDER — ONDANSETRON HCL 4 MG PO TABS
4.0000 mg | ORAL_TABLET | Freq: Four times a day (QID) | ORAL | Status: DC | PRN
  Administered 2015-08-22 – 2015-08-24 (×3): 4 mg via ORAL
  Filled 2015-08-20 (×2): qty 1

## 2015-08-20 MED ORDER — KETOROLAC TROMETHAMINE 30 MG/ML IJ SOLN
30.0000 mg | Freq: Once | INTRAMUSCULAR | Status: AC
  Administered 2015-08-20: 30 mg via INTRAVENOUS
  Filled 2015-08-20: qty 1

## 2015-08-20 MED ORDER — LORAZEPAM 2 MG/ML IJ SOLN
1.0000 mg | Freq: Four times a day (QID) | INTRAMUSCULAR | Status: AC | PRN
  Administered 2015-08-22: 1 mg via INTRAVENOUS
  Filled 2015-08-20: qty 1

## 2015-08-20 MED ORDER — ACETAMINOPHEN 500 MG PO TABS
1000.0000 mg | ORAL_TABLET | Freq: Once | ORAL | Status: AC
  Administered 2015-08-20: 1000 mg via ORAL
  Filled 2015-08-20: qty 2

## 2015-08-20 MED ORDER — ONDANSETRON HCL 4 MG/2ML IJ SOLN
4.0000 mg | Freq: Four times a day (QID) | INTRAMUSCULAR | Status: DC | PRN
  Administered 2015-08-21 – 2015-08-22 (×3): 4 mg via INTRAVENOUS
  Filled 2015-08-20 (×4): qty 2

## 2015-08-20 MED ORDER — LORAZEPAM 1 MG PO TABS
1.0000 mg | ORAL_TABLET | Freq: Four times a day (QID) | ORAL | Status: AC | PRN
  Filled 2015-08-20: qty 1

## 2015-08-20 MED ORDER — HYDRALAZINE HCL 20 MG/ML IJ SOLN
5.0000 mg | INTRAMUSCULAR | Status: DC | PRN
Start: 2015-08-20 — End: 2015-08-25
  Administered 2015-08-23: 5 mg via INTRAVENOUS
  Filled 2015-08-20: qty 1

## 2015-08-20 MED ORDER — POTASSIUM CHLORIDE 10 MEQ/100ML IV SOLN
10.0000 meq | Freq: Once | INTRAVENOUS | Status: AC
  Administered 2015-08-20: 10 meq via INTRAVENOUS
  Filled 2015-08-20: qty 100

## 2015-08-20 MED ORDER — LIDOCAINE HCL (PF) 1 % IJ SOLN
10.0000 mL | Freq: Once | INTRAMUSCULAR | Status: DC
  Administered 2015-08-20: 10 mL
  Filled 2015-08-20: qty 30

## 2015-08-20 MED ORDER — NICOTINE 21 MG/24HR TD PT24
21.0000 mg | MEDICATED_PATCH | Freq: Every day | TRANSDERMAL | Status: DC
  Administered 2015-08-21 – 2015-08-25 (×5): 21 mg via TRANSDERMAL
  Filled 2015-08-20 (×5): qty 1

## 2015-08-20 MED ORDER — GABAPENTIN 300 MG PO CAPS
300.0000 mg | ORAL_CAPSULE | Freq: Every day | ORAL | Status: DC
  Administered 2015-08-21 – 2015-08-24 (×5): 300 mg via ORAL
  Filled 2015-08-20 (×5): qty 1

## 2015-08-20 MED ORDER — SODIUM CHLORIDE 0.9 % IV SOLN: INTRAVENOUS | Status: AC

## 2015-08-20 MED ORDER — LORAZEPAM 2 MG/ML IJ SOLN: 0.0000 mg | Freq: Four times a day (QID) | INTRAMUSCULAR | Status: AC

## 2015-08-20 MED ORDER — SODIUM CHLORIDE 0.9% FLUSH
3.0000 mL | Freq: Two times a day (BID) | INTRAVENOUS | Status: DC
  Administered 2015-08-22: 3 mL via INTRAVENOUS

## 2015-08-20 MED ORDER — POTASSIUM CHLORIDE CRYS ER 20 MEQ PO TBCR
40.0000 meq | EXTENDED_RELEASE_TABLET | Freq: Once | ORAL | Status: AC
  Administered 2015-08-20: 40 meq via ORAL
  Filled 2015-08-20: qty 2

## 2015-08-20 MED ORDER — ADULT MULTIVITAMIN W/MINERALS CH
1.0000 | ORAL_TABLET | Freq: Every day | ORAL | Status: DC
  Administered 2015-08-21 – 2015-08-25 (×5): 1 via ORAL
  Filled 2015-08-20 (×5): qty 1

## 2015-08-20 MED ORDER — DEXTROSE 5 % IV SOLN
2.0000 g | Freq: Once | INTRAVENOUS | Status: AC
  Administered 2015-08-20: 2 g via INTRAVENOUS
  Filled 2015-08-20: qty 2

## 2015-08-20 MED ORDER — ACETAMINOPHEN 650 MG RE SUPP: 650.0000 mg | Freq: Four times a day (QID) | RECTAL | Status: DC | PRN

## 2015-08-20 MED ORDER — ASPIRIN-ACETAMINOPHEN-CAFFEINE 250-250-65 MG PO TABS
1.0000 | ORAL_TABLET | Freq: Four times a day (QID) | ORAL | Status: DC | PRN
  Administered 2015-08-21 – 2015-08-25 (×7): 1 via ORAL
  Filled 2015-08-20 (×10): qty 1

## 2015-08-20 MED ORDER — MORPHINE SULFATE (PF) 4 MG/ML IV SOLN
4.0000 mg | Freq: Once | INTRAVENOUS | Status: DC
  Filled 2015-08-20: qty 1

## 2015-08-20 MED ORDER — ACETAMINOPHEN 325 MG PO TABS
650.0000 mg | ORAL_TABLET | Freq: Four times a day (QID) | ORAL | Status: DC | PRN
  Administered 2015-08-22 – 2015-08-24 (×6): 650 mg via ORAL
  Filled 2015-08-20 (×6): qty 2

## 2015-08-20 MED ORDER — SODIUM CHLORIDE 0.9 % IV BOLUS (SEPSIS)
2000.0000 mL | Freq: Once | INTRAVENOUS | Status: AC
  Administered 2015-08-21: 2000 mL via INTRAVENOUS

## 2015-08-20 MED ORDER — VITAMIN B-1 100 MG PO TABS
100.0000 mg | ORAL_TABLET | Freq: Every day | ORAL | Status: DC
  Administered 2015-08-21 – 2015-08-25 (×5): 100 mg via ORAL
  Filled 2015-08-20 (×6): qty 1

## 2015-08-20 MED ORDER — DOXYCYCLINE HYCLATE 100 MG IV SOLR
100.0000 mg | Freq: Two times a day (BID) | INTRAVENOUS | Status: DC
  Administered 2015-08-21 – 2015-08-25 (×9): 100 mg via INTRAVENOUS
  Filled 2015-08-20 (×9): qty 100

## 2015-08-20 MED ORDER — SODIUM CHLORIDE 0.9 % IV SOLN
INTRAVENOUS | Status: DC
  Administered 2015-08-20 – 2015-08-24 (×4): via INTRAVENOUS

## 2015-08-20 MED ORDER — AMOXICILLIN 500 MG PO CAPS
500.0000 mg | ORAL_CAPSULE | Freq: Four times a day (QID) | ORAL | Status: DC
  Administered 2015-08-21 – 2015-08-22 (×5): 500 mg via ORAL
  Filled 2015-08-20 (×6): qty 1
  Filled 2015-08-20 (×2): qty 2
  Filled 2015-08-20: qty 1
  Filled 2015-08-20 (×3): qty 2

## 2015-08-20 MED ORDER — LORAZEPAM 2 MG/ML IJ SOLN: 0.0000 mg | Freq: Two times a day (BID) | INTRAMUSCULAR | Status: AC

## 2015-08-20 MED ORDER — PANTOPRAZOLE SODIUM 40 MG PO TBEC
40.0000 mg | DELAYED_RELEASE_TABLET | Freq: Every day | ORAL | Status: DC
  Administered 2015-08-21 – 2015-08-25 (×5): 40 mg via ORAL
  Filled 2015-08-20 (×5): qty 1

## 2015-08-20 MED ORDER — ASPIRIN 81 MG PO CHEW
81.0000 mg | CHEWABLE_TABLET | Freq: Every day | ORAL | Status: DC
  Administered 2015-08-21 – 2015-08-25 (×4): 81 mg via ORAL
  Filled 2015-08-20 (×4): qty 1

## 2015-08-20 NOTE — ED Notes (Signed)
Lumbar puncture at bedside and 22GA 5.00 in needle.

## 2015-08-20 NOTE — ED Notes (Signed)
Pt reports h/a with fever which started yesterday.  Pt also reports nausea.  No obvious neuro deficits noted at this time.

## 2015-08-20 NOTE — H&P (Addendum)
History and Physical    Nicole Mendez UEA:[id number] DOB: 1975-11-09 DOA: 08/20/2015  Referring MD/NP/PA:   PCP: Carollee Leitz, NP   Patient coming from:  The patient is coming from home.  At his baseline, she is independent for ADL.  Chief Complaint: Tick bite, fever, headache and abdominal pain  HPI: Nicole Mendez is a 40 y.o. female with medical history significant of tobacco abuse, alcohol abuse, psoriasis (receiving injection of Ustekinumab q54month), hypertension, hyperlipidemia, diabetes mellitus, arthritis, obesity, OSA not on CPAP, who presents with tick bite, fever, headache and abdominal pain.  Patient reports that she had tick bite over right shoulder 4 days ago. She did not develop rashes. Yesterday she developed fever with temperature 101 and headache. Headache is located in the top of her head, constant, 7 out of 10 in severity, nonradiating. Patient does not have neck stiffness, neck pain, photophobia, confusion or altered mental status. She has mild dry cough which she attributes to smoking. She does not have chest pain, runny nose or sore throat. She states that she was started with amoxicillin for dental pain by her dentist yesterday.  She reports mild abdominal pain in the past 2 or 3 days. Her abdominal pain is located in the periumbilical area, intermittent, 4 out of 10 in severity, nonradiating. He has some mild nausea, but no vomiting or diarrhea. Patient denies symptoms of UTI. She does not have unilateral weakness, numbness. No vision change or hearing loss. She states that she drinks 4 times a week, 2 cups of Vodka each time.   ED Course: pt was found to have WBC 3.8, temperature 103.1, tachycardia, tachypnea, potassium 2.9, creatinine normal, negative chest x-ray, urinalysis with small moderate leukocytes. ED physician tried to do LP without success. Pt is placed on tele bed for obs.  Review of Systems:   General: has fevers, chills, no changes in body  weight, has fatigue and HA.  HEENT: no blurry vision, hearing changes or sore throat Pulm: no dyspnea, coughing, wheezing CV: no chest pain, no palpitations Abd: has nausea, no vomiting, has abdominal pain, no diarrhea, constipation GU: no dysuria, burning on urination, increased urinary frequency, hematuria  Ext: no leg edema Neuro: no unilateral weakness, numbness, or tingling, no vision change or hearing loss Skin: no rash. Has healed insect bite mark over right shoulder. MSK: No muscle spasm, no deformity, no limitation of range of movement in spin Heme: No easy bruising.  Travel history: No recent long distant travel.  Allergy: No Known Allergies  Past Medical History  Diagnosis Date  . Diabetes mellitus without complication (HCC) 2000  . Hypertension   . Arthritis   . Hyperlipidemia   . Morbid obesity (HCC)   . Psoriasis   . OSA (obstructive sleep apnea)     Not on CPAP  . Tobacco abuse   . Alcohol abuse     History reviewed. No pertinent past surgical history.  Social History:  reports that she has been smoking Cigarettes.  She has been smoking about 0.50 packs per day. She does not have any smokeless tobacco history on file. She reports that she drinks alcohol. She reports that she does not use illicit drugs.  Family History:  Family History  Problem Relation Age of Onset  . Hypertension Mother   . Diabetes Mother   . Alcohol abuse Maternal Grandmother   . Cancer Maternal Grandmother     Ovarian  . Hypertension Maternal Grandmother   . Diabetes Maternal Grandmother  Prior to Admission medications   Medication Sig Start Date End Date Taking? Authorizing Provider  amoxicillin (AMOXIL) 500 MG capsule Take 500 mg by mouth 4 (four) times daily.  08/19/15  Yes Historical Provider, MD  Ascorbic Acid (VITAMIN C) 100 MG tablet Take 100 mg by mouth daily.   Yes Historical Provider, MD  aspirin 81 MG tablet Take 81 mg by mouth daily.   Yes Historical Provider, MD    aspirin-acetaminophen-caffeine (EXCEDRIN MIGRAINE) [phone] MG tablet Take 1 tablet by mouth every 6 (six) hours as needed for headache.   Yes Historical Provider, MD  gabapentin (NEURONTIN) 300 MG capsule Take 1 capsule (300 mg total) by mouth at bedtime. 07/13/15  Yes Carollee Leitzarrie M Doss, NP  labetalol (NORMODYNE) 100 MG tablet Take 1 tablet (100 mg total) by mouth 2 (two) times daily. 05/14/15  Yes Carollee Leitzarrie M Doss, NP  metFORMIN (GLUCOPHAGE) 500 MG tablet Take 1 tablet (500 mg total) by mouth 2 (two) times daily with a meal. Patient taking differently: Take 500 mg by mouth daily with breakfast.  05/14/15  Yes Carollee Leitzarrie M Doss, NP  cyclobenzaprine (FLEXERIL) 10 MG tablet Take 1 tablet (10 mg total) by mouth 3 (three) times daily as needed for muscle spasms. Patient not taking: Reported on 08/20/2015 04/14/15   Carollee Leitzarrie M Doss, NP  ustekinumab [facility](STELARA) 90 MG/ML SOSY injection every 3 (three) months. 06/03/14   Historical Provider, MD    Physical Exam: Filed Vitals:   08/20/15 1804 08/20/15 1943 08/20/15 2224 08/20/15 2344  BP: 155/90 159/16  159/99  Pulse: 118 110  109  Temp: 99.3 F (37.4 C) 103.1 F (39.5 C) 101.2 F (38.4 C) 100.1 F (37.8 C)  TempSrc: Oral Oral Oral Oral  Resp: 18 20  20   Height:    5\' 1"  (1.549 m)  Weight:    116.8 kg (257 lb 8 oz)  SpO2: 99% 96%  99%   General: Not in acute distress HEENT:       Eyes: PERRL, EOMI, no scleral icterus.       ENT: No discharge from the ears and nose, no pharynx injection, no tonsillar enlargement. Has dental pain in the second molar over left lower jaw.       Neck: No JVD, no bruit, no mass felt. Heme: No neck lymph node enlargement. Cardiac: S1/S2, RRR, Tachycardia, No murmurs, No gallops or rubs. Pulm: No rales, wheezing, rhonchi or rubs. Abd: Soft, nondistended, mild tenderness over the periumbilical area, no rebound pain, no organomegaly, BS present. GU: No hematuria Ext: No pitting leg edema bilaterally. 2+DP/PT pulse  bilaterally. Musculoskeletal: No joint deformities, No joint redness or warmth, no limitation of ROM in spin. Skin: No rashes.  Neuro: Alert, oriented X3, cranial nerves II-XII grossly intact, moves all extremities normally. Muscle strength 5/5 in all extremities, sensation to light touch intact. Knee reflex 1+ bilaterally. Negative Babinski's sign. Normal finger to nose test. Neck is supple. Psych: Patient is not psychotic, no suicidal or hemocidal ideation.  Labs on Admission: I have personally reviewed following labs and imaging studies  CBC:  Recent Labs Lab 08/20/15 1813  WBC 3.8*  NEUTROABS 2.9  HGB 11.0*  HCT 35.9*  MCV 80.7  PLT 337   Basic Metabolic Panel:  Recent Labs Lab 08/20/15 1813  NA 133*  K 2.9*  CL 101  CO2 23  GLUCOSE 92  BUN 9  CREATININE 0.79  CALCIUM 8.1*   GFR: Estimated Creatinine Clearance: 110.2 mL/min (by C-G formula based on  Cr of 0.79). Liver Function Tests:  Recent Labs Lab 08/20/15 1813  AST 31  ALT 19  ALKPHOS 73  BILITOT 0.4  PROT 7.0  ALBUMIN 3.7    Recent Labs Lab 08/20/15 2311  LIPASE 20   No results for input(s): AMMONIA in the last 168 hours. Coagulation Profile:  Recent Labs Lab 08/20/15 2311  INR 1.32   Cardiac Enzymes: No results for input(s): CKTOTAL, CKMB, CKMBINDEX, TROPONINI in the last 168 hours. BNP (last 3 results) No results for input(s): PROBNP in the last 8760 hours. HbA1C: No results for input(s): HGBA1C in the last 72 hours. CBG:  Recent Labs Lab 08/21/15 0013  GLUCAP 88   Lipid Profile: No results for input(s): CHOL, HDL, LDLCALC, TRIG, CHOLHDL, LDLDIRECT in the last 72 hours. Thyroid Function Tests: No results for input(s): TSH, T4TOTAL, FREET4, T3FREE, THYROIDAB in the last 72 hours. Anemia Panel: No results for input(s): VITAMINB12, FOLATE, FERRITIN, TIBC, IRON, RETICCTPCT in the last 72 hours. Urine analysis:    Component Value Date/Time   COLORURINE ORANGE* 08/20/2015 1800    APPEARANCEUR CLOUDY* 08/20/2015 1800   LABSPEC 1.044* 08/20/2015 1800   PHURINE 6.0 08/20/2015 1800   GLUCOSEU NEGATIVE 08/20/2015 1800   HGBUR NEGATIVE 08/20/2015 1800   HGBUR negative 06/22/2010 0857   BILIRUBINUR SMALL* 08/20/2015 1800   KETONESUR NEGATIVE 08/20/2015 1800   PROTEINUR 30* 08/20/2015 1800   UROBILINOGEN 0.2 06/22/2010 0857   NITRITE NEGATIVE 08/20/2015 1800   LEUKOCYTESUR SMALL* 08/20/2015 1800   Sepsis Labs: @LABRCNTIP (procalcitonin:4,lacticidven:4) )No results found for this or any previous visit (from the past 240 hour(s)).   Radiological Exams on Admission: Dg Chest 2 View  08/20/2015  CLINICAL DATA:  40 year old female with cough EXAM: CHEST  2 VIEW COMPARISON:  None. FINDINGS: The heart size and mediastinal contours are within normal limits. Both lungs are clear. The visualized skeletal structures are unremarkable. IMPRESSION: No active cardiopulmonary disease. Electronically Signed   By: Elgie Collard M.D.   On: 08/20/2015 23:09     EKG: Not done in ED, will get one.   Assessment/Plan Principal Problem:   Sepsis (HCC) Active Problems:   Severe obesity (BMI >= 40) (HCC)   Benign essential HTN   Obstructive apnea   Type 2 diabetes mellitus (HCC)   Tick bite   Fever   Headache   Psoriasis   Tobacco abuse   Alcohol abuse   Hypokalemia   Abdominal pain   Sepsis [facility]): Patient meets criteria for sepsis with WBC 3.8, fever, tachycardia and tachypnea. The source of infection is not clear, possibly due to dental infection. Patient has headache and fever, concerning for meningitis, but patient does not have any meningeal signs. The neck is supple, no confusion or altered mental status. Clinically does not seem to have meningitis. Patient has mild dry cough, chest x-ray is negative. Urinalysis has small moderate leukocytes, but patient is asymptomatic. ED physician tried LP without success. Patient was started empirically with Rocephin and doxycycline by  EDP. I discussed with ID, Dr. Luciana Axe on phone, who did no think pt has meningitis. Dr. Luciana Axe recommended to continue doxycycline to cover tickborne disease and amoxicillin for possible dental infection; and discontinue Rocephin per Dr. Joaquim Lai. Observe pt closely for any meningeal sign. If pt does no develop meningeal signs, LP will not be needed per Dr. Luciana Axe.  -Will place on tele bed for obs -continue doxycycline and amoxicillin -will get Procalcitonin and trend lactic acid levels per sepsis protocol. -IVF: 3L of NS  bolus in ED, followed by 125 cc/h -f/u Bx and Ux  Abdominal pain: Etiology is not clear. Need to rule out pancreatitis given having drinking. Potential differential diagnosis is alcoholic gastritis. On physical examination, patient does not have acute abdomen. -Check lipase -Start protonix -When necessary Zofran for nausea  Benign essential HTN: Blood pressure 159/16.  -Continue labetalol -IV hydralazine when necessary  DM-II: Last A1c 6.2 on 06/22/10, well controled. Patient is taking metformin at home -SSI  Psoriasis: Patient has been followed up by dermatologist, Dr. Roseanne Reno. She is getting Ustekinumab injection q3h month -f/u Dr. Roseanne Reno  Hypokalemia: K=2.9 on admission. - Repleted K and give 1 g of magnesium sulfate by IV. - Check Mg level  Tobacco abuse and Alcohol abuse: -Did counseling about importance of quitting smoking -Nicotine patch -Did counseling about the importance of quitting drinking -CIWA protocol   DVT ppx: SCD (pt may need LP if getting worse) Code Status: Full code Family Communication: None at bed side. Disposition Plan:  Anticipate discharge back to previous home environment Consults called:  I dicussed with ID, Dr. Luciana Axe Admission status: Obs / tele  Date of Service 08/21/2015    Lorretta Harp [facility] Pager [phone]  If 7PM-7AM, please contact night-coverage [email or web address] Password [email or web address] 08/21/2015, 12:21 AM

## 2015-08-20 NOTE — Telephone Encounter (Signed)
Patient Name: Nicole Mendez DOB: May 31, 1975 Initial Comment she is having bad headache, chills, yesterday her BP was high. also has abscess in her tooth and on antibiotic Nurse Assessment Nurse: Charna Elizabethrumbull, RN, Lynden Angathy Date/Time Lamount Cohen(Eastern Time): 08/20/2015 12:29:35 PM Confirm and document reason for call. If symptomatic, describe symptoms. You must click the next button to save text entered. ---Caller states she started an antibiotic yesterday for a toothache (symptoms improved) and she developed a severe headache today (rated as an 8 on the 1 to 10 scale). She developed fever yesterday (estimates it to be a high grade fever). Alert and responsive. Has the patient traveled out of the country within the last 30 days? ---No Does the patient have any new or worsening symptoms? ---Yes Will a triage be completed? ---Yes Related visit to physician within the last 2 weeks? ---No Does the PT have any chronic conditions? (i.e. diabetes, asthma, etc.) ---Yes List chronic conditions. ---Tooth Infection, High Blood Pressure, Diabetes, High Cholesterol Is the patient pregnant or possibly pregnant? (Ask all females between the ages of 2912-55) ---No Is this a behavioral health or substance abuse call? ---No Guidelines Guideline Title Affirmed Question Affirmed Notes Headache [1] SEVERE headache AND [2] suddenonset (i.e., reaching maximum intensity within seconds) Final Disposition User Go to ED Now (or PCP triage) Charna Elizabethrumbull, RN, Cathy Referrals [facility]. She plans to go to [facility] ED Disagree/Comply: Comply

## 2015-08-20 NOTE — ED Notes (Signed)
MD at bedside to perform lumbar puncture.

## 2015-08-20 NOTE — ED Provider Notes (Signed)
CSN: [id number]     Arrival date & time 08/20/15  1402 History   First MD Initiated Contact with Patient 08/20/15 1756     Chief Complaint  Patient presents with  . Fever  . Headache     (Consider location/radiation/quality/duration/timing/severity/associated sxs/prior Treatment) HPI Patient states she had a tick bite on her right shoulder 5 days ago. She went to see her doctor yesterday and she wasn't having symptoms but was concerned about tick bite. She was after she left the doctor's office, she started to get a headache and feels nauseated. She reports the symptoms seem most pronounced after she ate at [employer]. She reports she feels like she has had some fever. She has not been measuring her temperature at home. States that her temperature the doctor's office yesterday was 101. Headache is generalized and waxing and waning. She denies neck stiffness. Does endorse some light sensitivity. No focal weakness numbness tingling. She reports some chronic tingling from peripheral neuropathy. No worse than usual. No joint swellings or effusions. No rashes. Past Medical History  Diagnosis Date  . Diabetes mellitus without complication (HCC) 2000  . Hypertension   . Arthritis   . Hyperlipidemia    History reviewed. No pertinent past surgical history. Family History  Problem Relation Age of Onset  . Hypertension Mother   . Diabetes Mother   . Alcohol abuse Maternal Grandmother   . Cancer Maternal Grandmother     Ovarian  . Hypertension Maternal Grandmother   . Diabetes Maternal Grandmother    Social History  Substance Use Topics  . Smoking status: Current Every Day Smoker -- 0.50 packs/day    Types: Cigarettes  . Smokeless tobacco: None  . Alcohol Use: 0.0 oz/week    0 Standard drinks or equivalent per week   OB History    Gravida Para Term Preterm AB TAB SAB Ectopic Multiple Living       Review of Systems 10 Systems reviewed and are negative for acute  change except as noted in the HPI.    Allergies  Review of patient's allergies indicates no known allergies.  Home Medications   Prior to Admission medications   Medication Sig Start Date End Date Taking? Authorizing Provider  amoxicillin (AMOXIL) 500 MG capsule Take 500 mg by mouth 4 (four) times daily.  08/19/15  Yes Historical Provider, MD  Ascorbic Acid (VITAMIN C) 100 MG tablet Take 100 mg by mouth daily.   Yes Historical Provider, MD  aspirin 81 MG tablet Take 81 mg by mouth daily.   Yes Historical Provider, MD  aspirin-acetaminophen-caffeine (EXCEDRIN MIGRAINE) [phone] MG tablet Take 1 tablet by mouth every 6 (six) hours as needed for headache.   Yes Historical Provider, MD  gabapentin (NEURONTIN) 300 MG capsule Take 1 capsule (300 mg total) by mouth at bedtime. 07/13/15  Yes Carollee Leitz, NP  labetalol (NORMODYNE) 100 MG tablet Take 1 tablet (100 mg total) by mouth 2 (two) times daily. 05/14/15  Yes Carollee Leitz, NP  metFORMIN (GLUCOPHAGE) 500 MG tablet Take 1 tablet (500 mg total) by mouth 2 (two) times daily with a meal. Patient taking differently: Take 500 mg by mouth daily with breakfast.  05/14/15  Yes Carollee Leitz, NP  cyclobenzaprine (FLEXERIL) 10 MG tablet Take 1 tablet (10 mg total) by mouth 3 (three) times daily as needed for muscle spasms. Patient not taking: Reported on 08/20/2015 04/14/15   Carollee Leitz, NP  ustekinumab (STELARA) 90 MG/ML SOSY injection every 3 (three) months. 06/03/14   Historical Provider, MD   BP 159/16 mmHg  Pulse 110  Temp(Src) 101.2 F (38.4 C) (Oral)  Resp 20  SpO2 96%  LMP 07/24/2015 Physical Exam  Constitutional: She is oriented to person, place, and time. She appears well-developed and well-nourished.  Patient is well in appearance. She is ambulatory in the emergency department. No signs of distress. Normal respirations.  HENT:  Head: Normocephalic and atraumatic.  Right Ear: External ear normal.  Left Ear: External ear normal.  Nose:  Nose normal.  Mouth/Throat: Oropharynx is clear and moist.  TMs normal. Posterior oropharynx no erythema or exudate.  Eyes: EOM are normal. Pupils are equal, round, and reactive to light.  Neck: Neck supple.  No meningismus.  Cardiovascular: Normal rate, regular rhythm, normal heart sounds and intact distal pulses.   Pulmonary/Chest: Effort normal and breath sounds normal.  Abdominal: Soft. Bowel sounds are normal. She exhibits no distension. There is no tenderness.  Musculoskeletal: Normal range of motion. She exhibits no edema or tenderness.  Lymphadenopathy:    She has no cervical adenopathy.  Neurological: She is alert and oriented to person, place, and time. She has normal strength. Coordination normal. GCS eye subscore is 4. GCS verbal subscore is 5. GCS motor subscore is 6.  Skin: Skin is warm, dry and intact.  No rashes. Airway the patient had a tick bite on the posterior right shoulder shows a punctate scab with 0 erythema or induration.  Psychiatric: She has a normal mood and affect.    ED Course  .Lumbar Puncture Date/Time: 08/20/2015 10:33 PM Performed by: Arby BarrettePFEIFFER, [place] Lingenfelter Authorized by: Arby BarrettePFEIFFER, Azuree Minish Consent: Verbal consent obtained. Written consent obtained. Risks and benefits: risks, benefits and alternatives were discussed Consent given by: patient Indications: evaluation for infection Anesthesia: local infiltration Local anesthetic: lidocaine 1% without epinephrine Anesthetic total: 15 ml Patient sedated: no Preparation: Patient was prepped and draped in the usual sterile fashion. Lumbar space: L4-L5 interspace Patient's position: sitting Needle gauge: 22 Needle type: diamond point Needle length: 3.5 in Number of attempts: 4 Total volume: 0 ml Post-procedure: adhesive bandage applied Patient tolerance: Patient tolerated the procedure well with no immediate complications Comments: 4 attempts made to enter spinal canal at 2 separate interspaces. Bone was  intersected on all attempts. Unable to access the spinal canal. Patient tolerated well.   (including critical care time)  Labs Review Labs Reviewed  COMPREHENSIVE METABOLIC PANEL - Abnormal; Notable for the following:    Sodium 133 (*)    Potassium 2.9 (*)    Calcium 8.1 (*)    All other components within normal limits  CBC WITH DIFFERENTIAL/PLATELET - Abnormal; Notable for the following:    WBC 3.8 (*)    Hemoglobin 11.0 (*)    HCT 35.9 (*)    MCH 24.7 (*)    Lymphs Abs 0.6 (*)    All other components within normal limits  URINALYSIS, ROUTINE W REFLEX MICROSCOPIC (NOT AT [facility]) - Abnormal; Notable for the following:    Color, Urine ORANGE (*)    APPearance CLOUDY (*)    Specific Gravity, Urine 1.044 (*)    Bilirubin Urine SMALL (*)    Protein, ur 30 (*)    Leukocytes, UA SMALL (*)    All other components within normal limits  URINE MICROSCOPIC-ADD ON - Abnormal; Notable for the following:    Squamous Epithelial / LPF 6-30 (*)    Bacteria, UA FEW (*)  All other components within normal limits  CSF CULTURE  GRAM STAIN  CULTURE, BLOOD (ROUTINE X 2)  CULTURE, BLOOD (ROUTINE X 2)  B. BURGDORFI ANTIBODIES  ROCKY MTN SPOTTED FVR ABS PNL(IGG+IGM)  CSF CELL COUNT WITH DIFFERENTIAL  CSF CELL COUNT WITH DIFFERENTIAL  GLUCOSE, CSF  PROTEIN, CSF  ARBOVIRUS IGG, CSF  B. BURGDORFI ANTIBODIES, CSF  I-STAT CG4 LACTIC ACID, ED    Imaging Review No results found. I have personally reviewed and evaluated these images and lab results as part of my medical decision-making.   EKG Interpretation None      MDM   Final diagnoses:  Acute nonintractable headache, unspecified headache type  Fever, unspecified fever cause  Tick bite of back, initial encounter   Patient presents with history of a tick bite 4 days ago. Clinically she is nontoxic and alert with clear mental status. She is up and ambulatory with steady gait. Patient does not have meningismus. She does however have  headache, fever and photophobia. With positive history of tick bite patient was empirically started on doxycycline with serum specimens obtained for RMSF and Lyme disease. I also planned to obtain CSF to evaluate for possible  Meningitis. As I was unable to obtain a specimen, the plan will be to initiate Rocephin empirically and admit the patient for observation with further treatment by hospitalist. Depending of patient's condition and results obtained, consideration may be given to interventional radiology for CSF specimen.    Arby Barrette, MD 08/20/15 2240

## 2015-08-20 NOTE — ED Notes (Addendum)
[facility].

## 2015-08-20 NOTE — ED Notes (Signed)
Admitting MD at bedside.

## 2015-08-21 ENCOUNTER — Observation Stay (HOSPITAL_COMMUNITY): Payer: 59

## 2015-08-21 DIAGNOSIS — W57XXXA Bitten or stung by nonvenomous insect and other nonvenomous arthropods, initial encounter: Secondary | ICD-10-CM

## 2015-08-21 DIAGNOSIS — G4733 Obstructive sleep apnea (adult) (pediatric): Secondary | ICD-10-CM

## 2015-08-21 DIAGNOSIS — R509 Fever, unspecified: Secondary | ICD-10-CM | POA: Diagnosis not present

## 2015-08-21 DIAGNOSIS — I10 Essential (primary) hypertension: Secondary | ICD-10-CM | POA: Diagnosis not present

## 2015-08-21 DIAGNOSIS — R51 Headache: Secondary | ICD-10-CM

## 2015-08-21 DIAGNOSIS — Z720 Tobacco use: Secondary | ICD-10-CM

## 2015-08-21 DIAGNOSIS — A419 Sepsis, unspecified organism: Secondary | ICD-10-CM | POA: Diagnosis not present

## 2015-08-21 DIAGNOSIS — E876 Hypokalemia: Secondary | ICD-10-CM

## 2015-08-21 DIAGNOSIS — E119 Type 2 diabetes mellitus without complications: Secondary | ICD-10-CM

## 2015-08-21 DIAGNOSIS — L409 Psoriasis, unspecified: Secondary | ICD-10-CM

## 2015-08-21 DIAGNOSIS — T148 Other injury of unspecified body region: Secondary | ICD-10-CM

## 2015-08-21 LAB — MAGNESIUM: Magnesium: 1.5 mg/dL — ABNORMAL LOW (ref 1.7–2.4)

## 2015-08-21 LAB — GLUCOSE, CAPILLARY
GLUCOSE-CAPILLARY: 83 mg/dL (ref 65–99)
GLUCOSE-CAPILLARY: 108 mg/dL — AB (ref 65–99)
Glucose-Capillary: 110 mg/dL — ABNORMAL HIGH (ref 65–99)
Glucose-Capillary: 88 mg/dL (ref 65–99)
Glucose-Capillary: 89 mg/dL (ref 65–99)

## 2015-08-21 LAB — HCG, QUANTITATIVE, PREGNANCY: hCG, Beta Chain, Quant, S: <1 m[IU]/mL (ref <5)

## 2015-08-21 LAB — CBC
HCT: 31.4 % — ABNORMAL LOW (ref 36.0–46.0)
Hemoglobin: 9.8 g/dL — ABNORMAL LOW (ref 12.0–15.0)
MCH: 25 pg — ABNORMAL LOW (ref 26.0–34.0)
MCHC: 31.2 g/dL (ref 30.0–36.0)
MCV: 80.1 fL (ref 78.0–100.0)
PLATELETS: 281 10*3/uL (ref 150–400)
RBC: 3.92 MIL/uL (ref 3.87–5.11)
RDW: 15.5 % (ref 11.5–15.5)
WBC: 2.6 10*3/uL — AB (ref 4.0–10.5)

## 2015-08-21 LAB — LIPASE, BLOOD: LIPASE: 20 U/L (ref 11–51)

## 2015-08-21 LAB — RAPID URINE DRUG SCREEN, HOSP PERFORMED
AMPHETAMINES: NOT DETECTED
Barbiturates: NOT DETECTED
Benzodiazepines: NOT DETECTED
COCAINE: NOT DETECTED
OPIATES: NOT DETECTED

## 2015-08-21 LAB — BASIC METABOLIC PANEL
Anion gap: 6 (ref 5–15)
BUN: 6 mg/dL (ref 6–20)
CALCIUM: 7.3 mg/dL — AB (ref 8.9–10.3)
CO2: 22 mmol/L (ref 22–32)
CREATININE: 0.54 mg/dL (ref 0.44–1.00)
Chloride: 107 mmol/L (ref 101–111)
Glucose, Bld: 92 mg/dL (ref 65–99)
Potassium: 3.3 mmol/L — ABNORMAL LOW (ref 3.5–5.1)
SODIUM: 135 mmol/L (ref 135–145)

## 2015-08-21 LAB — PROCALCITONIN: Procalcitonin: 0.15 ng/mL

## 2015-08-21 LAB — ROCKY MTN SPOTTED FVR ABS PNL(IGG+IGM)
RMSF IGG: NEGATIVE
RMSF IGM: 0.41 {index} (ref 0.00–0.89)

## 2015-08-21 LAB — LACTIC ACID, PLASMA
Lactic Acid, Venous: 0.8 mmol/L (ref 0.5–2.0)
Lactic Acid, Venous: 0.5 mmol/L (ref 0.5–2.0)

## 2015-08-21 LAB — B. BURGDORFI ANTIBODIES

## 2015-08-21 MED ORDER — HYDROMORPHONE HCL 1 MG/ML IJ SOLN
0.5000 mg | Freq: Three times a day (TID) | INTRAMUSCULAR | Status: DC | PRN
  Administered 2015-08-22 – 2015-08-24 (×5): 0.5 mg via INTRAVENOUS
  Filled 2015-08-21 (×5): qty 1

## 2015-08-21 MED ORDER — OXYCODONE HCL 5 MG PO TABS
5.0000 mg | ORAL_TABLET | Freq: Four times a day (QID) | ORAL | Status: DC | PRN
  Administered 2015-08-22 – 2015-08-24 (×3): 5 mg via ORAL
  Filled 2015-08-21 (×4): qty 1

## 2015-08-21 MED ORDER — POTASSIUM CHLORIDE 20 MEQ/15ML (10%) PO SOLN
20.0000 meq | Freq: Once | ORAL | Status: AC
  Administered 2015-08-21: 20 meq via ORAL
  Filled 2015-08-21: qty 15

## 2015-08-21 MED ORDER — INSULIN ASPART 100 UNIT/ML ~~LOC~~ SOLN
0.0000 [IU] | Freq: Three times a day (TID) | SUBCUTANEOUS | Status: DC
  Administered 2015-08-24: 1 [IU] via SUBCUTANEOUS

## 2015-08-21 MED ORDER — HYDROMORPHONE HCL 1 MG/ML IJ SOLN
1.0000 mg | INTRAMUSCULAR | Status: DC | PRN
  Administered 2015-08-21: 1 mg via INTRAVENOUS
  Filled 2015-08-21: qty 1

## 2015-08-21 MED ORDER — INSULIN ASPART 100 UNIT/ML ~~LOC~~ SOLN: 0.0000 [IU] | Freq: Every day | SUBCUTANEOUS | Status: DC

## 2015-08-21 MED ORDER — MAGNESIUM SULFATE IN D5W 1-5 GM/100ML-% IV SOLN
1.0000 g | Freq: Once | INTRAVENOUS | Status: AC
  Administered 2015-08-21: 1 g via INTRAVENOUS
  Filled 2015-08-21: qty 100

## 2015-08-21 NOTE — Progress Notes (Addendum)
Informed Dr. Clyde LundborgNiu that the [facility] levels for the Urine Drug screen were inconclusive as reported by lab personnel.  No further orders at this time.  [name], [name] RN 12:48 AM 08/21/2015

## 2015-08-21 NOTE — Progress Notes (Signed)
PROGRESS NOTE  Nicole Mendez ZOX:[id number] DOB: 1975-07-10 DOA: 08/20/2015 PCP: Carollee Leitz, NP  HPI/Recap of past 24 hours:  Headache has improved this am, but in the afternoon , had another episode of severe headache requiring dilaudid, fever subsided  A female friend in room  Assessment/Plan: Principal Problem:   Sepsis (HCC) Active Problems:   Severe obesity (BMI >= 40) (HCC)   Benign essential HTN   Obstructive apnea   Type 2 diabetes mellitus (HCC)   Tick bite   Fever   Headache   Psoriasis   Tobacco abuse   Alcohol abuse   Hypokalemia   Abdominal pain  Sepsis [facility]): Patient meets criteria for sepsis with WBC 3.8, fever, tachycardia and tachypnea. The source of infection is not clear, possibly due to dental infection. Or tick born disease Patient has headache and fever, concerning for meningitis, but patient does not have any meningeal signs. The neck is supple, no confusion or altered mental status. Clinically does not seem to have meningitis. Patient has mild dry cough, chest x-ray is negative. Urinalysis has small moderate leukocytes, but patient is asymptomatic. ED physician tried LP without success. Patient was started empirically with Rocephin and doxycycline by EDP. Admitting MD discussed with ID, Dr. Luciana Axe on phone, who did no think pt has meningitis. Dr. Luciana Axe recommended to continue doxycycline to cover tickborne disease and amoxicillin for possible dental infection; and discontinue Rocephin per Dr. Joaquim Lai. Observe pt closely for any meningeal sign. If pt does no develop meningeal signs, LP will not be needed per Dr. Luciana Axe.  Lactic acid wnl, culture no growth so far, tick born disease serology lyme titer negative, RSMF titer pending, ehrlichia titer pending, no diffuse skin rash, headache from tick born disease? From dental infection? CT head no acute findings.   Abdominal pain:  Etiology is not clear. Lft/lipase wnl. Potential differential diagnosis is  alcoholic gastritis. On physical examination, patient does not have acute abdomen. -Start protonix -When necessary Zofran for nausea -denies ab pain on 5/19  Benign essential HTN: Blood pressure 159/16. h/o medication noncompliance per pmd records -Continue labetalol -IV hydralazine when necessary  DM-II: Last A1c 6.2 on 06/22/10, well controled. Patient is taking metformin at home -SSI  Psoriasis: Patient has been followed up by dermatologist, Dr. Roseanne Reno. She is getting Ustekinumab injection q3h month -f/u Dr. Roseanne Reno  Hypokalemia/hypomagnesemia : K=2.9 on admission. - replace, keep k>4, mag >2  Tobacco abuse and Alcohol abuse: -Did counseling about importance of quitting smoking -Nicotine patch -Did counseling about the importance of quitting drinking -CIWA protocol  Morbid obesity: Body mass index is 48.68 kg/(m^2).  OSA not on cpap  DVT ppx: SCD (pt may need LP if getting worse) Code Status: Full code Family Communication: None at bed side.   Disposition Plan: home in a few days   Consultants:  Admitting MD Dr Clyde Lundborg talked to infectious disease Dr Luciana Axe  Procedures:  Lumbar puncture attempted at bedside by EDP without success  Antibiotics:  Rocephin x1  Doxycycline from admission to cover tick born disease  Amoxicillin from admission to cover teeth infection   Objective: BP 119/80 mmHg  Pulse 96  Temp(Src) 99.1 F (37.3 C) (Oral)  Resp 20  Ht 5\' 1"  (1.549 m)  Wt 116.8 kg (257 lb 8 oz)  BMI 48.68 kg/m2  SpO2 99%  LMP 07/24/2015  Intake/Output Summary (Last 24 hours) at 08/21/15 2053 Last data filed at 08/21/15 0706  Gross per 24 hour  Intake 3649.17 ml  Output      0 ml  Net 3649.17 ml   Filed Weights   08/20/15 2344  Weight: 116.8 kg (257 lb 8 oz)    Exam:   General:  NAD. obese  Cardiovascular: RRR  Respiratory: CTABL  Abdomen: Soft/ND/NT, positive BS  Musculoskeletal: No Edema  Neuro: aaox3  Skin: right should small  tick bite mark with small central scab without erythema or induration. No significant psoriatic skin lesion on exam  Data Reviewed: Basic Metabolic Panel:  Recent Labs Lab 08/20/15 1813 08/21/15 0133  NA 133* 135  K 2.9* 3.3*  CL 101 107  CO2 23 22  GLUCOSE 92 92  BUN 9 6  CREATININE 0.79 0.54  CALCIUM 8.1* 7.3*  MG  --  1.5*   Liver Function Tests:  Recent Labs Lab 08/20/15 1813  AST 31  ALT 19  ALKPHOS 73  BILITOT 0.4  PROT 7.0  ALBUMIN 3.7    Recent Labs Lab 08/20/15 2311  LIPASE 20   No results for input(s): AMMONIA in the last 168 hours. CBC:  Recent Labs Lab 08/20/15 1813 08/21/15 0133  WBC 3.8* 2.6*  NEUTROABS 2.9  --   HGB 11.0* 9.8*  HCT 35.9* 31.4*  MCV 80.7 80.1  PLT 337 281   Cardiac Enzymes:   No results for input(s): CKTOTAL, CKMB, CKMBINDEX, TROPONINI in the last 168 hours. BNP (last 3 results) No results for input(s): BNP in the last 8760 hours.  ProBNP (last 3 results) No results for input(s): PROBNP in the last 8760 hours.  CBG:  Recent Labs Lab 08/21/15 0013 08/21/15 0738 08/21/15 1156 08/21/15 1635  GLUCAP 88 83 108* 89    No results found for this or any previous visit (from the past 240 hour(s)).   Studies: Dg Chest 2 View  08/20/2015  CLINICAL DATA:  40 year old female with cough EXAM: CHEST  2 VIEW COMPARISON:  None. FINDINGS: The heart size and mediastinal contours are within normal limits. Both lungs are clear. The visualized skeletal structures are unremarkable. IMPRESSION: No active cardiopulmonary disease. Electronically Signed   By: Elgie CollardArash  Radparvar M.D.   On: 08/20/2015 23:09   Ct Head Wo Contrast  08/21/2015  CLINICAL DATA:  Fever, headache, abdominal pain, tick bite, history smoking, alcohol abuse, psoriasis, hypertension, diabetes mellitus, sleep apnea EXAM: CT HEAD WITHOUT CONTRAST TECHNIQUE: Contiguous axial images were obtained from the base of the skull through the vertex without intravenous contrast.  COMPARISON:  None FINDINGS: Normal ventricular morphology. No midline shift or mass effect. Normal appearance of brain parenchyma. No intracranial hemorrhage, mass lesion or evidence acute infarction. No extra-axial fluid collections. 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 Sinuses and mastoid air cells otherwise unremarkable. Osseous structures normal appearance. IMPRESSION: No acute intracranial abnormalities. Electronically Signed   By: Ulyses SouthwardMark  Boles M.D.   On: 08/21/2015 18:40    Scheduled Meds: . sodium chloride   Intravenous STAT  . amoxicillin  500 mg Oral QID  . aspirin  81 mg Oral Daily  . doxycycline (VIBRAMYCIN) IV  100 mg Intravenous Q12H  . folic acid  1 mg Oral Daily  . gabapentin  300 mg Oral QHS  . insulin aspart  0-5 Units Subcutaneous QHS  . insulin aspart  0-9 Units Subcutaneous TID WC  . labetalol  100 mg Oral BID  . LORazepam  0-4 mg Intravenous Q6H   Followed by  . [START ON 08/23/2015] LORazepam  0-4 mg Intravenous Q12H  . multivitamin with minerals  1 tablet Oral Daily  . nicotine  21 mg Transdermal Daily  . pantoprazole  40 mg Oral Q1200  . sodium chloride flush  3 mL Intravenous Q12H  . thiamine  100 mg Oral Daily   Or  . thiamine  100 mg Intravenous Daily  . vitamin C  250 mg Oral Daily    Continuous Infusions: . sodium chloride 50 mL/hr at 08/21/15 1707     Time spent:  Nicole Hendricksen MD, PhD  [facility] Pager [phone]. If 7PM-7AM, please contact night-coverage at [email or web address], password [facility] 08/21/2015, 8:53 PM

## 2015-08-22 ENCOUNTER — Observation Stay (HOSPITAL_COMMUNITY): Payer: 59

## 2015-08-22 DIAGNOSIS — I10 Essential (primary) hypertension: Secondary | ICD-10-CM | POA: Diagnosis not present

## 2015-08-22 DIAGNOSIS — R509 Fever, unspecified: Secondary | ICD-10-CM | POA: Diagnosis not present

## 2015-08-22 DIAGNOSIS — R51 Headache: Secondary | ICD-10-CM | POA: Diagnosis not present

## 2015-08-22 DIAGNOSIS — A419 Sepsis, unspecified organism: Secondary | ICD-10-CM | POA: Diagnosis not present

## 2015-08-22 LAB — CBC WITH DIFFERENTIAL/PLATELET
BASOS ABS: 0.1 10*3/uL (ref 0.0–0.1)
Basophils Relative: 2 %
EOS PCT: 0 %
Eosinophils Absolute: 0 10*3/uL (ref 0.0–0.7)
HEMATOCRIT: 34.7 % — AB (ref 36.0–46.0)
HEMOGLOBIN: 10.8 g/dL — AB (ref 12.0–15.0)
LYMPHS ABS: 1.7 10*3/uL (ref 0.7–4.0)
LYMPHS PCT: 26 %
MCH: 24.6 pg — ABNORMAL LOW (ref 26.0–34.0)
MCHC: 31.1 g/dL (ref 30.0–36.0)
MCV: 79 fL (ref 78.0–100.0)
MONOS PCT: 11 %
Monocytes Absolute: 0.7 10*3/uL (ref 0.1–1.0)
NEUTROS ABS: 3.9 10*3/uL (ref 1.7–7.7)
Neutrophils Relative %: 61 %
Platelets: 290 10*3/uL (ref 150–400)
RBC: 4.39 MIL/uL (ref 3.87–5.11)
RDW: 15.5 % (ref 11.5–15.5)
WBC: 6.4 10*3/uL (ref 4.0–10.5)

## 2015-08-22 LAB — BASIC METABOLIC PANEL
ANION GAP: 9 (ref 5–15)
BUN: 5 mg/dL — ABNORMAL LOW (ref 6–20)
CALCIUM: 8.2 mg/dL — AB (ref 8.9–10.3)
CHLORIDE: 101 mmol/L (ref 101–111)
CO2: 23 mmol/L (ref 22–32)
Creatinine, Ser: 0.57 mg/dL (ref 0.44–1.00)
GFR calc non Af Amer: >60 mL/min (ref >60)
GLUCOSE: 82 mg/dL (ref 65–99)
Potassium: 3.6 mmol/L (ref 3.5–5.1)
Sodium: 133 mmol/L — ABNORMAL LOW (ref 135–145)

## 2015-08-22 LAB — MAGNESIUM: Magnesium: 1.6 mg/dL — ABNORMAL LOW (ref 1.7–2.4)

## 2015-08-22 LAB — CSF CELL COUNT WITH DIFFERENTIAL
RBC COUNT CSF: 2 /mm3 — AB
TUBE #: 4
WBC CSF: 1 /mm3 (ref 0–5)

## 2015-08-22 LAB — PROTEIN, CSF: Total  Protein, CSF: 32 mg/dL (ref 15–45)

## 2015-08-22 LAB — GLUCOSE, CAPILLARY
GLUCOSE-CAPILLARY: 93 mg/dL (ref 65–99)
GLUCOSE-CAPILLARY: 78 mg/dL (ref 65–99)
Glucose-Capillary: 112 mg/dL — ABNORMAL HIGH (ref 65–99)
Glucose-Capillary: 77 mg/dL (ref 65–99)

## 2015-08-22 LAB — URINE CULTURE: CULTURE: NO GROWTH

## 2015-08-22 LAB — HIV ANTIBODY (ROUTINE TESTING W REFLEX): HIV Screen 4th Generation wRfx: NONREACTIVE

## 2015-08-22 LAB — GLUCOSE, CSF: Glucose, CSF: 55 mg/dL (ref 40–70)

## 2015-08-22 LAB — MRSA PCR SCREENING: MRSA BY PCR: NEGATIVE

## 2015-08-22 MED ORDER — DEXTROSE 5 % IV SOLN
2.0000 g | Freq: Two times a day (BID) | INTRAVENOUS | Status: DC
  Administered 2015-08-22: 2 g via INTRAVENOUS
  Filled 2015-08-22 (×2): qty 2

## 2015-08-22 MED ORDER — AMOXICILLIN-POT CLAVULANATE 875-125 MG PO TABS
1.0000 | ORAL_TABLET | Freq: Two times a day (BID) | ORAL | Status: DC
  Administered 2015-08-22 – 2015-08-25 (×6): 1 via ORAL
  Filled 2015-08-22 (×6): qty 1

## 2015-08-22 MED ORDER — DEXTROSE 5 % IV SOLN: 2.0000 g | INTRAVENOUS | Status: DC

## 2015-08-22 MED ORDER — MAGNESIUM SULFATE 2 GM/50ML IV SOLN
2.0000 g | Freq: Once | INTRAVENOUS | Status: AC
  Administered 2015-08-22: 2 g via INTRAVENOUS
  Filled 2015-08-22: qty 50

## 2015-08-22 MED ORDER — DEXTROSE 5 % IV SOLN
750.0000 mg | Freq: Three times a day (TID) | INTRAVENOUS | Status: DC
  Administered 2015-08-22 – 2015-08-25 (×9): 750 mg via INTRAVENOUS
  Filled 2015-08-22 (×9): qty 15

## 2015-08-22 NOTE — Progress Notes (Signed)
Spoke to Dr Roda ShuttersXu regarding treatment plan - Dr Roda ShuttersXu states patient is receiving IV antibiotics for tick related meningitis and does not need contact or droplet isolation at this time.

## 2015-08-22 NOTE — Progress Notes (Signed)
Pt complains of headache again, states "it's similar to the one I had before." Pt described head like it was "burning." PRN dilaudid given, as well as zofran to combat any nausea associated with pain. Will continue to monitor closely.

## 2015-08-22 NOTE — Progress Notes (Signed)
CRITICAL VALUE ALERT  Critical value received:  LP Gramstain  Date of notification:  08/22/15  Time of notification:  1210  Critical value read back: YES  Nurse who received alert:  Jeannine KittenHaskins RN  MD notified (1st page):  Roda ShuttersXu  Time of first page:  1210  MD notified (2nd page):  Time of second page:  Responding MD:  Roda ShuttersXu  Time MD responded:  [id number]

## 2015-08-22 NOTE — Progress Notes (Signed)
Pharmacy Antibiotic Note  Nicole Mendez is a 40 y.o. female admitted on 08/20/2015 with viral meningitis.  Pharmacy has been consulted for acyclovir dosing. Pt keeps spiking fevers, severe headache and reports light sensitivity.   Pt is also on Augmentin 875 mg PO BID for dental infection and doxycycline 100 mg PO BID for tick bite.   Plan: Acyclovir 750 mg IV q8h, using Adjusted BW of 75.5 kg.   Height: 5\' 1"  (154.9 cm) Weight: 257 lb 8 oz (116.8 kg) IBW/kg (Calculated) : 47.8  Temp (24hrs), Avg:100 F (37.8 C), Min:98.3 F (36.8 C), Max:102.9 F (39.4 C)   Recent Labs Lab 08/20/15 1813 08/20/15 2314 08/21/15 0133 08/22/15 0505  WBC 3.8*  --  2.6* 6.4  CREATININE 0.79  --  0.54 0.57  LATICACIDVEN  --  0.8 0.5  --     Estimated Creatinine Clearance: 110.2 mL/min (by C-G formula based on Cr of 0.57).    No Known Allergies  Antimicrobials this admission: 5/18 doxy >>  5/18 rocephin >> x1 ED 5/17 amoxicillin >> 5/20 5/20 Augmentin >> 5/20 acyclovir >>  Dose adjustments this admission: ---  Microbiology results: 5/18 BCx: NGTD  5/18 UCx: NGF  5/20 CSF:  No organisms seen, WBC present, predominantly PNM 5/20 MRSA PCR: negative   Thank you for allowing pharmacy to be a part of this patient's care.   Adalberto ColeNikola Kayde Atkerson, PharmD, BCPS Pager [phone] 08/22/2015 7:34 PM

## 2015-08-22 NOTE — Procedures (Signed)
Procedure Note  Procedure: Fluoro guided LP.  L3-L4.  9.5cc clear CSF.   Opening Pressure of 24cm H2O  Complications: None Recommendations:  - Flat supine x 4 hours - Routine care - Labs per primary/neurology    Signed,  Yvone NeuJaime S. Loreta AveWagner, DO

## 2015-08-22 NOTE — Progress Notes (Signed)
PROGRESS NOTE  Nicole Mendez ZOX:[id number] DOB: 25-Feb-1976 DOA: 08/20/2015 PCP: Carollee Leitz, NP  HPI/Recap of past 24 hours:  Keep spiking fever, severe headache, also report light sensitivity   Assessment/Plan: Principal Problem:   Sepsis (HCC) Active Problems:   Severe obesity (BMI >= 40) (HCC)   Benign essential HTN   Obstructive apnea   Type 2 diabetes mellitus (HCC)   Tick bite   Fever   Headache   Psoriasis   Tobacco abuse   Alcohol abuse   Hypokalemia   Abdominal pain  Sepsis (HCC): presented on admission, Patient meets criteria for sepsis with WBC 3.8, fever, tachycardia and tachypnea. The source of infection is not clear, possibly due to dental infection. Or tick born disease? Meningitis? chest x-ray is negative. Urinalysis has small moderate leukocytes, but patient is asymptomatic. Lactic acid wnl, culture no growth so far,  Headache/light sensitivity: headache from tick born disease? From dental infection? CT head no acute findings.  Patient has headache and fever, concerning for meningitis, but patient does not have any meningeal signs. The neck is supple, no confusion or altered mental status.   ED physician tried LP without success. Patient was started empirically with Rocephin and doxycycline by EDP.  Due to continue spiking fever and persistent severe headache with reported light sensitivity, lumbar puncture was done on 5/20 by radiology,Csf with 1wbc, normal protein and glucose. Gram stain no organism seen Aseptic /viral meningitis? Csf hsv pcr pending, patient report h/o genital herpes, but none recently, will start acyclovir.    tick born disease?   lyme titer negative, RSMF titer negative, ehrlichia titer pending, no diffuse skin rash, continue doxycycline.   Abdominal pain: on presentation. Etiology is not clear. Lft/lipase wnl. Potential differential diagnosis is alcoholic gastritis. On physical examination, patient does not have acute  abdomen. -Start protonix -When necessary Zofran for nausea -seems has resolved  Benign essential HTN: Blood pressure 159/16. h/o medication noncompliance per pmd records -Continue labetalol -IV hydralazine when necessary  DM-II: Last A1c 6.2 on 06/22/10, well controled. Patient is taking metformin at home -SSI  Psoriasis: Patient has been followed up by dermatologist, Dr. Roseanne Reno. She is getting Ustekinumab injection q3h month -f/u Dr. Roseanne Reno  Hypokalemia/hypomagnesemia : K=2.9 on admission. - replace, keep k>4, mag >2  Tobacco abuse and Alcohol abuse: -Did counseling about importance of quitting smoking -Nicotine patch -Did counseling about the importance of quitting drinking -CIWA protocol  Morbid obesity: Body mass index is 48.68 kg/(m^2).  OSA not on cpap  DVT ppx: SCD (pt may need LP if getting worse) Code Status: Full code Family Communication: None at bed side.  Disposition Plan: home in a few days   Consultants:  Admitting MD Dr Clyde Lundborg talked to infectious disease Dr Luciana Axe  Procedures:  Lumbar puncture attempted at bedside by EDP without success  Fluoro guided lumbar puncture on 5/20  Antibiotics:  Rocephin x1  Doxycycline from admission to cover tick born disease  Amoxicillin from admission to cover teeth infection to 5/19  augmentin from 5/20   Objective: BP 150/79 mmHg  Pulse 96  Temp(Src) 98.3 F (36.8 C) (Oral)  Resp 20  Ht 5\' 1"  (1.549 m)  Wt 116.8 kg (257 lb 8 oz)  BMI 48.68 kg/m2  SpO2 96%  LMP 07/24/2015  Intake/Output Summary (Last 24 hours) at 08/22/15 1752 Last data filed at 08/22/15 1745  Gross per 24 hour  Intake 3676.25 ml  Output      0 ml  Net  6578.46 ml   Filed Weights   08/20/15 2344  Weight: 116.8 kg (257 lb 8 oz)    Exam:   General:  NAD. obese  Cardiovascular: RRR  Respiratory: CTABL  Abdomen: Soft/ND/NT, positive BS  Musculoskeletal: No Edema  Neuro: aaox3  Skin: right should small tick bite  mark with small central scab without erythema or induration. No significant psoriatic skin lesion on exam  Data Reviewed: Basic Metabolic Panel:  Recent Labs Lab 08/20/15 1813 08/21/15 0133 08/22/15 0505  NA 133* 135 133*  K 2.9* 3.3* 3.6  CL 101 107 101  CO2 GLUCOSE 92 92 82  BUN 9 6 5*  CREATININE 0.79 0.54 0.57  CALCIUM 8.1* 7.3* 8.2*  MG  --  1.5* 1.6*   Liver Function Tests:  Recent Labs Lab 08/20/15 1813  AST 31  ALT 19  ALKPHOS 73  BILITOT 0.4  PROT 7.0  ALBUMIN 3.7    Recent Labs Lab 08/20/15 2311  LIPASE 20   No results for input(s): AMMONIA in the last 168 hours. CBC:  Recent Labs Lab 08/20/15 1813 08/21/15 0133 08/22/15 0505  WBC 3.8* 2.6* 6.4  NEUTROABS 2.9  --  3.9  HGB 11.0* 9.8* 10.8*  HCT 35.9* 31.4* 34.7*  MCV 80.7 80.1 79.0  PLT 337 281 290   Cardiac Enzymes:   No results for input(s): CKTOTAL, CKMB, CKMBINDEX, TROPONINI in the last 168 hours. BNP (last 3 results) No results for input(s): BNP in the last 8760 hours.  ProBNP (last 3 results) No results for input(s): PROBNP in the last 8760 hours.  CBG:  Recent Labs Lab 08/21/15 1635 08/21/15 2107 08/22/15 0803 08/22/15 1156 08/22/15 1725  GLUCAP 89 110* 112* 77 93    Recent Results (from the past 240 hour(s))  Urine culture     Status: None   Collection Time: 08/20/15  6:00 PM  Result Value Ref Range Status   Specimen Description URINE, CLEAN CATCH  Final   Special Requests NONE  Final   Culture NO GROWTH Performed at [facility]   Final   Report Status 08/22/2015 FINAL  Final  Culture, blood (routine x 2)     Status: None (Preliminary result)   Collection Time: 08/20/15 11:13 PM  Result Value Ref Range Status   Specimen Description BLOOD LEFT ANTECUBITAL  Final   Special Requests BOTTLES DRAWN AEROBIC AND ANAEROBIC 5CC EA  Final   Culture   Final    NO GROWTH 1 DAY Performed at [facility]    Report Status PENDING  Incomplete   Culture, blood (routine x 2)     Status: None (Preliminary result)   Collection Time: 08/20/15 11:21 PM  Result Value Ref Range Status   Specimen Description BLOOD BLOOD LEFT HAND  Final   Special Requests BOTTLES DRAWN AEROBIC AND ANAEROBIC 5CC EA  Final   Culture   Final    NO GROWTH 1 DAY Performed at [facility]    Report Status PENDING  Incomplete  CSF culture     Status: None (Preliminary result)   Collection Time: 08/22/15 11:00 AM  Result Value Ref Range Status   Specimen Description CSF  Final   Special Requests NONE  Final   Gram Stain   Final    NO ORGANISMS SEEN WBC PRESENT, PREDOMINANTLY PMN CYTOSPUN Gram Stain Report Called to,Read Back By and Verified With: K HASKINS AT 1212 ON 05.20.2017 BY NBROOKS    Culture PENDING  Incomplete   Report Status PENDING  Incomplete  MRSA PCR Screening     Status: None   Collection Time: 08/22/15 12:50 PM  Result Value Ref Range Status   MRSA by PCR NEGATIVE NEGATIVE Final    Comment:        The GeneXpert MRSA Assay (FDA approved for NASAL specimens only), is one component of a comprehensive MRSA colonization surveillance program. It is not intended to diagnose MRSA infection nor to guide or monitor treatment for MRSA infections.      Studies: Ct Head Wo Contrast  08/21/2015  CLINICAL DATA:  Fever, headache, abdominal pain, tick bite, history smoking, alcohol abuse, psoriasis, hypertension, diabetes mellitus, sleep apnea EXAM: CT HEAD WITHOUT CONTRAST TECHNIQUE: Contiguous axial images were obtained from the base of the skull through the vertex without intravenous contrast. COMPARISON:  None FINDINGS: Normal ventricular morphology. No midline shift or mass effect. Normal appearance of brain parenchyma. No intracranial hemorrhage, mass lesion or evidence acute infarction. No extra-axial fluid collections. 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 Sinuses and mastoid air cells otherwise unremarkable.  Osseous structures normal appearance. IMPRESSION: No acute intracranial abnormalities. Electronically Signed   By: Ulyses SouthwardMark  Boles M.D.   On: 08/21/2015 18:40   Dg Fluoro Guide Lumbar Puncture  08/22/2015  CLINICAL DATA:  40 year old female with a concern for meningitis. EXAM: DIAGNOSTIC LUMBAR PUNCTURE UNDER FLUOROSCOPIC GUIDANCE FLUOROSCOPY TIME:  Fluoroscopy Time (in minutes and seconds): 6 seconds PROCEDURE: Informed consent was obtained from the patient prior to the procedure, including potential complications of headache, allergy, and pain. With the patient prone, the lower back was prepped with Betadine. 1% Lidocaine was used for local anesthesia. Lumbar puncture was performed at the L3-L4 level using a 22 gauge needle with return of clear CSF with an opening pressure of 24 cm water. 9.5 ml of CSF were obtained for laboratory studies. The patient tolerated the procedure well and there were no apparent complications. IMPRESSION: Status post fluoroscopy guided lumbar puncture at L3-L4 with clear CSF and opening pressure of 24 cm water. Signed, Yvone NeuJaime S. Loreta AveWagner, DO [facility] Electronically Signed   By: Gilmer MorJaime  Wagner D.O.   On: 08/22/2015 11:41    Scheduled Meds: . acyclovir  750 mg Intravenous Q8H  . amoxicillin-clavulanate  1 tablet Oral Q12H  . aspirin  81 mg Oral Daily  . doxycycline (VIBRAMYCIN) IV  100 mg Intravenous Q12H  . folic acid  1 mg Oral Daily  . gabapentin  300 mg Oral QHS  . insulin aspart  0-5 Units Subcutaneous QHS  . insulin aspart  0-9 Units Subcutaneous TID WC  . labetalol  100 mg Oral BID  . LORazepam  0-4 mg Intravenous Q6H   Followed by  . [START ON 08/23/2015] LORazepam  0-4 mg Intravenous Q12H  . multivitamin with minerals  1 tablet Oral Daily  . nicotine  21 mg Transdermal Daily  . pantoprazole  40 mg Oral Q1200  . sodium chloride flush  3 mL Intravenous Q12H  . thiamine  100 mg Oral Daily   Or  . thiamine   100 mg Intravenous Daily  . vitamin C  250 mg Oral Daily    Continuous Infusions: . sodium chloride 50 mL/hr at 08/21/15 1707     Time spent: 35mins  Jerelyn Trimarco MD, PhD  [facility] Pager [phone]. If 7PM-7AM, please contact night-coverage at [email or web address], password [email or web address] 08/22/2015, 5:52 PM

## 2015-08-22 NOTE — Progress Notes (Signed)
Pt spiked temp to 102, HR and BP elevated. Pt stated it was difficult to breathe, placed on 1L O2 and given PRN tylenol. On-call paged to update, no new orders given.  Will continue to monitor closely.

## 2015-08-23 DIAGNOSIS — Z79899 Other long term (current) drug therapy: Secondary | ICD-10-CM | POA: Diagnosis not present

## 2015-08-23 DIAGNOSIS — E119 Type 2 diabetes mellitus without complications: Secondary | ICD-10-CM | POA: Diagnosis present

## 2015-08-23 DIAGNOSIS — F1010 Alcohol abuse, uncomplicated: Secondary | ICD-10-CM | POA: Diagnosis present

## 2015-08-23 DIAGNOSIS — S40261A Insect bite (nonvenomous) of right shoulder, initial encounter: Secondary | ICD-10-CM | POA: Diagnosis present

## 2015-08-23 DIAGNOSIS — M1990 Unspecified osteoarthritis, unspecified site: Secondary | ICD-10-CM | POA: Diagnosis present

## 2015-08-23 DIAGNOSIS — I10 Essential (primary) hypertension: Secondary | ICD-10-CM | POA: Diagnosis present

## 2015-08-23 DIAGNOSIS — R51 Headache: Secondary | ICD-10-CM | POA: Diagnosis present

## 2015-08-23 DIAGNOSIS — L409 Psoriasis, unspecified: Secondary | ICD-10-CM | POA: Diagnosis present

## 2015-08-23 DIAGNOSIS — F17210 Nicotine dependence, cigarettes, uncomplicated: Secondary | ICD-10-CM | POA: Diagnosis present

## 2015-08-23 DIAGNOSIS — R509 Fever, unspecified: Secondary | ICD-10-CM | POA: Diagnosis not present

## 2015-08-23 DIAGNOSIS — E876 Hypokalemia: Secondary | ICD-10-CM | POA: Diagnosis present

## 2015-08-23 DIAGNOSIS — Z7984 Long term (current) use of oral hypoglycemic drugs: Secondary | ICD-10-CM | POA: Diagnosis not present

## 2015-08-23 DIAGNOSIS — R109 Unspecified abdominal pain: Secondary | ICD-10-CM | POA: Diagnosis present

## 2015-08-23 DIAGNOSIS — Z9114 Patient's other noncompliance with medication regimen: Secondary | ICD-10-CM | POA: Diagnosis not present

## 2015-08-23 DIAGNOSIS — G4733 Obstructive sleep apnea (adult) (pediatric): Secondary | ICD-10-CM | POA: Diagnosis present

## 2015-08-23 DIAGNOSIS — Z7982 Long term (current) use of aspirin: Secondary | ICD-10-CM | POA: Diagnosis not present

## 2015-08-23 DIAGNOSIS — W57XXXA Bitten or stung by nonvenomous insect and other nonvenomous arthropods, initial encounter: Secondary | ICD-10-CM | POA: Diagnosis present

## 2015-08-23 DIAGNOSIS — A419 Sepsis, unspecified organism: Secondary | ICD-10-CM | POA: Diagnosis present

## 2015-08-23 DIAGNOSIS — Z6841 Body Mass Index (BMI) 40.0 and over, adult: Secondary | ICD-10-CM | POA: Diagnosis not present

## 2015-08-23 DIAGNOSIS — K047 Periapical abscess without sinus: Secondary | ICD-10-CM | POA: Diagnosis present

## 2015-08-23 DIAGNOSIS — E669 Obesity, unspecified: Secondary | ICD-10-CM | POA: Diagnosis present

## 2015-08-23 DIAGNOSIS — E785 Hyperlipidemia, unspecified: Secondary | ICD-10-CM | POA: Diagnosis present

## 2015-08-23 LAB — COMPREHENSIVE METABOLIC PANEL
ALBUMIN: 2.9 g/dL — AB (ref 3.5–5.0)
ALK PHOS: 68 U/L (ref 38–126)
ALT: 23 U/L (ref 14–54)
ANION GAP: 6 (ref 5–15)
AST: 29 U/L (ref 15–41)
BILIRUBIN TOTAL: 0.6 mg/dL (ref 0.3–1.2)
BUN: 7 mg/dL (ref 6–20)
CHLORIDE: 102 mmol/L (ref 101–111)
CO2: 26 mmol/L (ref 22–32)
Calcium: 7.9 mg/dL — ABNORMAL LOW (ref 8.9–10.3)
Creatinine, Ser: 0.65 mg/dL (ref 0.44–1.00)
GFR calc non Af Amer: >60 mL/min (ref >60)
Glucose, Bld: 91 mg/dL (ref 65–99)
POTASSIUM: 4 mmol/L (ref 3.5–5.1)
Sodium: 134 mmol/L — ABNORMAL LOW (ref 135–145)
Total Protein: 6.5 g/dL (ref 6.5–8.1)

## 2015-08-23 LAB — CBC
HEMATOCRIT: 31.2 % — AB (ref 36.0–46.0)
HEMOGLOBIN: 9.8 g/dL — AB (ref 12.0–15.0)
MCH: 24.7 pg — ABNORMAL LOW (ref 26.0–34.0)
MCHC: 31.4 g/dL (ref 30.0–36.0)
MCV: 78.6 fL (ref 78.0–100.0)
Platelets: 254 10*3/uL (ref 150–400)
RBC: 3.97 MIL/uL (ref 3.87–5.11)
RDW: 15.6 % — ABNORMAL HIGH (ref 11.5–15.5)
WBC: 8.8 10*3/uL (ref 4.0–10.5)

## 2015-08-23 LAB — LACTIC ACID, PLASMA: LACTIC ACID, VENOUS: 0.7 mmol/L (ref 0.5–2.0)

## 2015-08-23 LAB — C-REACTIVE PROTEIN: CRP: 20.3 mg/dL — AB (ref <1.0)

## 2015-08-23 LAB — GLUCOSE, CAPILLARY
GLUCOSE-CAPILLARY: 130 mg/dL — AB (ref 65–99)
Glucose-Capillary: 106 mg/dL — ABNORMAL HIGH (ref 65–99)
Glucose-Capillary: 92 mg/dL (ref 65–99)
Glucose-Capillary: 123 mg/dL — ABNORMAL HIGH (ref 65–99)

## 2015-08-23 LAB — SEDIMENTATION RATE: SED RATE: 40 mm/h — AB (ref 0–22)

## 2015-08-23 LAB — MAGNESIUM: MAGNESIUM: 2 mg/dL (ref 1.7–2.4)

## 2015-08-23 NOTE — Progress Notes (Signed)
Pt had short burst of Trigeminal PVC's, asymptomatic, VSS then returns to baseline SR-ST

## 2015-08-23 NOTE — Progress Notes (Signed)
PROGRESS NOTE  Nicole Mendez ZOX:[id number] DOB: April 12, 1975 DOA: 08/20/2015 PCP: Carollee Leitz, NP  HPI/Recap of past 24 hours:  Last  Fever of 103 at 9pm on 5/20  Feeling a lot better this am, headache has much improved, still slight headache, but no photosensitivity, no neck rigidity   Assessment/Plan: Principal Problem:   Sepsis (HCC) Active Problems:   Severe obesity (BMI >= 40) (HCC)   Benign essential HTN   Obstructive apnea   Type 2 diabetes mellitus (HCC)   Tick bite   Fever   Headache   Psoriasis   Tobacco abuse   Alcohol abuse   Hypokalemia   Abdominal pain  Sepsis (HCC): presented on admission, Patient meets criteria for sepsis with WBC 3.8, fever, tachycardia and tachypnea. The source of infection is not clear, possibly due to dental infection. Or tick born disease? Meningitis? chest x-ray is negative. Urinalysis has small moderate leukocytes, but patient is asymptomatic. Lactic acid wnl, culture no growth so far, Improving, fever seems has subsided.   Headache/light sensitivity: headache from tick born disease? From dental infection? CT head no acute findings.  Patient has headache and fever, concerning for meningitis, but patient does not have any meningeal signs. The neck is supple, no confusion or altered mental status.   ED physician tried LP without success. Patient was started empirically with Rocephin and doxycycline by EDP.  Due to continue spiking fever and persistent severe headache with reported light sensitivity, lumbar puncture was done on 5/20 by radiology,Csf with 1wbc, normal protein and glucose. Gram stain no organism seen Aseptic /viral meningitis? Csf hsv pcr pending, patient report h/o genital herpes, but none recently, started acyclovir on 5/20 Much better on 5/21   tick born disease?   lyme titer negative, RSMF titer negative, ehrlichia titer pending, no diffuse skin rash, continue doxycycline.   Abdominal pain: on  presentation. Etiology is not clear. Lft/lipase wnl. Potential differential diagnosis is alcoholic gastritis. On physical examination, patient does not have acute abdomen. -Start protonix -When necessary Zofran for nausea -seems has resolved  Benign essential HTN: Blood pressure 159/16. h/o medication noncompliance per pmd records -Continue labetalol -IV hydralazine when necessary  DM-II: Last A1c 6.2 on 06/22/10, well controled. Patient is taking metformin at home -SSI  Psoriasis: Patient has been followed up by dermatologist, Dr. Roseanne Reno. She is getting Ustekinumab injection q3h month -f/u Dr. Roseanne Reno  Hypokalemia/hypomagnesemia : K=2.9 on admission. - replace, keep k>4, mag >2  Tobacco abuse and Alcohol abuse: -Did counseling about importance of quitting smoking -Nicotine patch -Did counseling about the importance of quitting drinking -CIWA protocol  Morbid obesity: Body mass index is 48.68 kg/(m^2).  OSA not on cpap  DVT ppx: SCD (pt may need LP if getting worse) Code Status: Full code Family Communication: None at bed side.  Disposition Plan: home in a few days   Consultants:  Admitting MD Dr Clyde Lundborg talked to infectious disease Dr Luciana Axe  Procedures:  Lumbar puncture attempted at bedside by EDP without success  Fluoro guided lumbar puncture on 5/20  Antibiotics:  Rocephin x1  Doxycycline from admission to cover tick born disease  Amoxicillin from admission to cover teeth infection to 5/19  augmentin from 5/20   Objective: BP 147/89 mmHg  Pulse 96  Temp(Src) 98.5 F (36.9 C) (Oral)  Resp 20  Ht 5\' 1"  (1.549 m)  Wt 116.8 kg (257 lb 8 oz)  BMI 48.68 kg/m2  SpO2 98%  LMP 07/24/2015  Intake/Output Summary (Last 24  hours) at 08/23/15 1433 Last data filed at 08/23/15 1300  Gross per 24 hour  Intake 2102.5 ml  Output   1700 ml  Net  402.5 ml   Filed Weights   08/20/15 2344  Weight: 116.8 kg (257 lb 8 oz)    Exam:   General:  NAD.  obese  Cardiovascular: RRR  Respiratory: CTABL  Abdomen: Soft/ND/NT, positive BS  Musculoskeletal: No Edema  Neuro: aaox3  Skin: right should small tick bite mark with small central scab without erythema or induration. No significant psoriatic skin lesion on exam  Data Reviewed: Basic Metabolic Panel:  Recent Labs Lab 08/20/15 1813 08/21/15 0133 08/22/15 0505 08/23/15 0547  NA 133* 135 133* 134*  K 2.9* 3.3* 3.6 4.0  CL 101 107 101 102  CO2 23 22 23 26   GLUCOSE 92 92 82 91  BUN 9 6 5* 7  CREATININE 0.79 0.54 0.57 0.65  CALCIUM 8.1* 7.3* 8.2* 7.9*  MG  --  1.5* 1.6* 2.0   Liver Function Tests:  Recent Labs Lab 08/20/15 1813 08/23/15 0547  AST 31 29  ALT 19 23  ALKPHOS 73 68  BILITOT 0.4 0.6  PROT 7.0 6.5  ALBUMIN 3.7 2.9*    Recent Labs Lab 08/20/15 2311  LIPASE 20   No results for input(s): AMMONIA in the last 168 hours. CBC:  Recent Labs Lab 08/20/15 1813 08/21/15 0133 08/22/15 0505 08/23/15 0547  WBC 3.8* 2.6* 6.4 8.8  NEUTROABS 2.9  --  3.9  --   HGB 11.0* 9.8* 10.8* 9.8*  HCT 35.9* 31.4* 34.7* 31.2*  MCV 80.7 80.1 79.0 78.6  PLT 337 281 290 254   Cardiac Enzymes:   No results for input(s): CKTOTAL, CKMB, CKMBINDEX, TROPONINI in the last 168 hours. BNP (last 3 results) No results for input(s): BNP in the last 8760 hours.  ProBNP (last 3 results) No results for input(s): PROBNP in the last 8760 hours.  CBG:  Recent Labs Lab 08/22/15 1156 08/22/15 1725 08/22/15 2130 08/23/15 0731 08/23/15 1214  GLUCAP 77 93 78 130* 106*    Recent Results (from the past 240 hour(s))  Urine culture     Status: None   Collection Time: 08/20/15  6:00 PM  Result Value Ref Range Status   Specimen Description URINE, CLEAN CATCH  Final   Special Requests NONE  Final   Culture NO GROWTH [facility]   Final   Report Status 08/22/2015 FINAL  Final  Culture, blood (routine x 2)     Status: None (Preliminary result)    Collection Time: 08/20/15 11:13 PM  Result Value Ref Range Status   Specimen Description BLOOD LEFT ANTECUBITAL  Final   Special Requests BOTTLES DRAWN AEROBIC AND ANAEROBIC 5CC EA  Final   Culture   Final    NO GROWTH 2 DAYS Performed at [facility]    Report Status PENDING  Incomplete  Culture, blood (routine x 2)     Status: None (Preliminary result)   Collection Time: 08/20/15 11:21 PM  Result Value Ref Range Status   Specimen Description BLOOD BLOOD LEFT HAND  Final   Special Requests BOTTLES DRAWN AEROBIC AND ANAEROBIC 5CC EA  Final   Culture   Final    NO GROWTH 2 DAYS Performed at [facility]    Report Status PENDING  Incomplete  CSF culture     Status: None (Preliminary result)   Collection Time: 08/22/15 11:00 AM  Result Value Ref  Range Status   Specimen Description CSF  Final   Special Requests NONE  Final   Gram Stain   Final    NO ORGANISMS SEEN WBC PRESENT, PREDOMINANTLY PMN CYTOSPUN Gram Stain Report Called to,Read Back By and Verified With: K HASKINS AT 1212 ON 05.20.2017 BY NBROOKS    Culture   Final    NO GROWTH < 24 HOURS Performed at [facility]    Report Status PENDING  Incomplete  MRSA PCR Screening     Status: None   Collection Time: 08/22/15 12:50 PM  Result Value Ref Range Status   MRSA by PCR NEGATIVE NEGATIVE Final    Comment:        The GeneXpert MRSA Assay (FDA approved for NASAL specimens only), is one component of a comprehensive MRSA colonization surveillance program. It is not intended to diagnose MRSA infection nor to guide or monitor treatment for MRSA infections.      Studies: No results found.  Scheduled Meds: . acyclovir  750 mg Intravenous Q8H  . amoxicillin-clavulanate  1 tablet Oral Q12H  . aspirin  81 mg Oral Daily  . doxycycline (VIBRAMYCIN) IV  100 mg Intravenous Q12H  . folic acid  1 mg Oral Daily  . gabapentin  300 mg Oral QHS  . insulin aspart  0-5 Units Subcutaneous QHS  . insulin  aspart  0-9 Units Subcutaneous TID WC  . labetalol  100 mg Oral BID  . LORazepam  0-4 mg Intravenous Q12H  . multivitamin with minerals  1 tablet Oral Daily  . nicotine  21 mg Transdermal Daily  . pantoprazole  40 mg Oral Q1200  . sodium chloride flush  3 mL Intravenous Q12H  . thiamine  100 mg Oral Daily   Or  . thiamine  100 mg Intravenous Daily  . vitamin C  250 mg Oral Daily    Continuous Infusions: . sodium chloride 50 mL/hr at 08/21/15 1707     Time spent:  Eevie Lapp MD, PhD  [facility] Pager [phone]. If 7PM-7AM, please contact night-coverage at [email or web address], password [facility] 08/23/2015, 2:33 PM

## 2015-08-24 LAB — CBC
HEMATOCRIT: 29.5 % — AB (ref 36.0–46.0)
Hemoglobin: 9.3 g/dL — ABNORMAL LOW (ref 12.0–15.0)
MCH: 24.7 pg — AB (ref 26.0–34.0)
MCHC: 31.5 g/dL (ref 30.0–36.0)
MCV: 78.2 fL (ref 78.0–100.0)
PLATELETS: 252 10*3/uL (ref 150–400)
RBC: 3.77 MIL/uL — ABNORMAL LOW (ref 3.87–5.11)
RDW: 15.8 % — AB (ref 11.5–15.5)
WBC: 6.8 10*3/uL (ref 4.0–10.5)

## 2015-08-24 LAB — BASIC METABOLIC PANEL
ANION GAP: 7 (ref 5–15)
BUN: 6 mg/dL (ref 6–20)
CALCIUM: 8.1 mg/dL — AB (ref 8.9–10.3)
CO2: 26 mmol/L (ref 22–32)
Chloride: 103 mmol/L (ref 101–111)
Creatinine, Ser: 0.54 mg/dL (ref 0.44–1.00)
Glucose, Bld: 82 mg/dL (ref 65–99)
Potassium: 3.5 mmol/L (ref 3.5–5.1)
Sodium: 136 mmol/L (ref 135–145)

## 2015-08-24 LAB — TSH: TSH: 2.56 u[IU]/mL (ref 0.350–4.500)

## 2015-08-24 LAB — GLUCOSE, CAPILLARY
GLUCOSE-CAPILLARY: 94 mg/dL (ref 65–99)
Glucose-Capillary: 93 mg/dL (ref 65–99)
Glucose-Capillary: 128 mg/dL — ABNORMAL HIGH (ref 65–99)
Glucose-Capillary: 98 mg/dL (ref 65–99)

## 2015-08-24 LAB — HERPES SIMPLEX VIRUS(HSV) DNA BY PCR
HSV 1 DNA: NEGATIVE
HSV 2 DNA: NEGATIVE

## 2015-08-24 MED ORDER — BISACODYL 5 MG PO TBEC
10.0000 mg | DELAYED_RELEASE_TABLET | Freq: Once | ORAL | Status: AC
  Administered 2015-08-24: 10 mg via ORAL
  Filled 2015-08-24: qty 2

## 2015-08-24 MED ORDER — POTASSIUM CHLORIDE CRYS ER 20 MEQ PO TBCR
40.0000 meq | EXTENDED_RELEASE_TABLET | Freq: Once | ORAL | Status: AC
  Administered 2015-08-24: 40 meq via ORAL
  Filled 2015-08-24: qty 2

## 2015-08-24 MED ORDER — DOCUSATE SODIUM 100 MG PO CAPS
200.0000 mg | ORAL_CAPSULE | Freq: Two times a day (BID) | ORAL | Status: DC
  Administered 2015-08-24 – 2015-08-25 (×2): 200 mg via ORAL
  Filled 2015-08-24 (×2): qty 2

## 2015-08-24 NOTE — Progress Notes (Addendum)
PROGRESS NOTE  Nicole Mendez:[id number] DOB: Jun 14, 1975 DOA: 08/20/2015 PCP: Carollee Leitz, NP  HPI/Recap of past 24 hours:    Still have headache and slight photosensitivity, no neck rigidity, no fever in last 24hrs (Last  Fever of 103 at 9pm on 5/20)   Assessment/Plan: Principal Problem:   Sepsis (HCC) Active Problems:   Severe obesity (BMI >= 40) (HCC)   Benign essential HTN   Obstructive apnea   Type 2 diabetes mellitus (HCC)   Tick bite   Fever   Headache   Psoriasis   Tobacco abuse   Alcohol abuse   Hypokalemia   Abdominal pain  Sepsis (HCC):  presented on admission, Patient meets criteria for sepsis with WBC 3.8, fever, tachycardia and tachypnea. The source of infection is not clear, possibly due to dental infection. Or tick born disease? Meningitis? chest x-ray is negative. Urinalysis has small moderate leukocytes, but patient is asymptomatic. Lactic acid wnl, culture no growth so far, Improving, fever seems has subsided.   Headache/light sensitivity: headache from tick born disease? From dental infection? CT head no acute findings.  Patient has headache and fever, concerning for meningitis, but patient does not have any meningeal signs. The neck is supple, no confusion or altered mental status.   ED physician tried LP without success. Patient was started empirically with Rocephin and doxycycline by EDP.  Due to continue spiking fever and persistent severe headache with reported light sensitivity, lumbar puncture was done on 5/20 by radiology,Csf with 1wbc, normal protein and glucose. Gram stain no organism seen, Aseptic /viral meningitis? Csf hsv pcr negative, patient report h/o genital herpes, but none recently, started acyclovir on 5/20  patient has been getting anti interleukin therapy q43months for psoriasis, also has diagnosis of noninsulin dependent dm2, thus has underline immunosuppression, Clinically improving, will continue doxy and acyclovir  therapy.   tick born disease?   she had a tick pulled out from her right shoulder on admission on 5/18, another tick was found crawling on her arm on 5/22 lyme titer negative, RSMF titer negative, ehrlichia titer pending, no diffuse skin rash, continue doxycycline.   Abdominal pain: on presentation. Etiology is not clear. Lft/lipase wnl. Potential differential diagnosis is alcoholic gastritis. On physical examination, patient does not have acute abdomen. -Start protonix -When necessary Zofran for nausea -seems has resolved  Benign essential HTN: Blood pressure 159/16. h/o medication noncompliance per pmd records -Continue labetalol -IV hydralazine when necessary  DM-II: Last A1c 6.2 on 06/22/10, well controled. Patient is taking metformin at home -SSI  Psoriasis: Patient has been followed up by dermatologist, Dr. Roseanne Reno. She is getting Ustekinumab injection q3h month -f/u Dr. Roseanne Reno  Hypokalemia/hypomagnesemia : K=2.9 on admission. - replace, keep k>4, mag >2  Tobacco abuse and Alcohol abuse: -Did counseling about importance of quitting smoking -Nicotine patch -Did counseling about the importance of quitting drinking -CIWA protocol  Morbid obesity: Body mass index is 48.68 kg/(m^2).  OSA not on cpap  DVT ppx: SCD (pt may need LP if getting worse) Code Status: Full code Family Communication: None at bed side.  Disposition Plan: home on 5/23 if fever free   Consultants:  Admitting MD Dr Clyde Lundborg talked to infectious disease Dr Luciana Axe  Procedures:  Lumbar puncture attempted at bedside by EDP without success  Fluoro guided lumbar puncture on 5/20  Antibiotics:  Rocephin x1  Doxycycline from admission to cover tick born disease  Amoxicillin from admission to cover teeth infection to 5/19  augmentin from 5/20  Objective: BP 146/84 mmHg  Pulse 89  Temp(Src) 98 F (36.7 C) (Oral)  Resp 20  Ht 5\' 1"  (1.549 m)  Wt 116.8 kg (257 lb 8 oz)  BMI 48.68 kg/m2   SpO2 96%  LMP 07/24/2015  Intake/Output Summary (Last 24 hours) at 08/24/15 1555 Last data filed at 08/24/15 1500  Gross per 24 hour  Intake   1965 ml  Output      5 ml  Net   1960 ml   Filed Weights   08/20/15 2344  Weight: 116.8 kg (257 lb 8 oz)    Exam:   General:  NAD. obese  Cardiovascular: RRR  Respiratory: CTABL  Abdomen: Soft/ND/NT, positive BS  Musculoskeletal: No Edema  Neuro: aaox3  Skin: right should small tick bite mark with small central scab without erythema or induration. No significant psoriatic skin lesion on exam  Data Reviewed: Basic Metabolic Panel:  Recent Labs Lab 08/20/15 1813 08/21/15 0133 08/22/15 0505 08/23/15 0547 08/24/15 0517  NA 133* 135 133* 134* 136  K 2.9* 3.3* 3.6 4.0 3.5  CL 101 107 101 102 103  CO2 23 22 23 26 26   GLUCOSE 92 92 82 91 82  BUN 9 6 5* 7 6  CREATININE 0.79 0.54 0.57 0.65 0.54  CALCIUM 8.1* 7.3* 8.2* 7.9* 8.1*  MG  --  1.5* 1.6* 2.0  --    Liver Function Tests:  Recent Labs Lab 08/20/15 1813 08/23/15 0547  AST 31 29  ALT 19 23  ALKPHOS 73 68  BILITOT 0.4 0.6  PROT 7.0 6.5  ALBUMIN 3.7 2.9*    Recent Labs Lab 08/20/15 2311  LIPASE 20   No results for input(s): AMMONIA in the last 168 hours. CBC:  Recent Labs Lab 08/20/15 1813 08/21/15 0133 08/22/15 0505 08/23/15 0547 08/24/15 0517  WBC 3.8* 2.6* 6.4 8.8 6.8  NEUTROABS 2.9  --  3.9  --   --   HGB 11.0* 9.8* 10.8* 9.8* 9.3*  HCT 35.9* 31.4* 34.7* 31.2* 29.5*  MCV 80.7 80.1 79.0 78.6 78.2  PLT 337 281 290 254 252   Cardiac Enzymes:   No results for input(s): CKTOTAL, CKMB, CKMBINDEX, TROPONINI in the last 168 hours. BNP (last 3 results) No results for input(s): BNP in the last 8760 hours.  ProBNP (last 3 results) No results for input(s): PROBNP in the last 8760 hours.  CBG:  Recent Labs Lab 08/23/15 1214 08/23/15 1705 08/23/15 2252 08/24/15 0739 08/24/15 1120  GLUCAP 106* 92 123* 93 128*    Recent Results (from the  past 240 hour(s))  Urine culture     Status: None   Collection Time: 08/20/15  6:00 PM  Result Value Ref Range Status   Specimen Description URINE, CLEAN CATCH  Final   Special Requests NONE  Final   Culture NO GROWTH Performed at [facility]   Final   Report Status 08/22/2015 FINAL  Final  Culture, blood (routine x 2)     Status: None (Preliminary result)   Collection Time: 08/20/15 11:13 PM  Result Value Ref Range Status   Specimen Description BLOOD LEFT ANTECUBITAL  Final   Special Requests BOTTLES DRAWN AEROBIC AND ANAEROBIC 5CC EA  Final   Culture   Final    NO GROWTH 3 DAYS Performed at [facility]    Report Status PENDING  Incomplete  Culture, blood (routine x 2)     Status: None (Preliminary result)   Collection Time: 08/20/15 11:21 PM  Result  Value Ref Range Status   Specimen Description BLOOD BLOOD LEFT HAND  Final   Special Requests BOTTLES DRAWN AEROBIC AND ANAEROBIC 5CC EA  Final   Culture   Final    NO GROWTH 3 DAYS Performed at [facility]    Report Status PENDING  Incomplete  CSF culture     Status: None (Preliminary result)   Collection Time: 08/22/15 11:00 AM  Result Value Ref Range Status   Specimen Description CSF  Final   Special Requests NONE  Final   Gram Stain   Final    NO ORGANISMS SEEN WBC PRESENT, PREDOMINANTLY PMN CYTOSPUN Gram Stain Report Called to,Read Back By and Verified With: K HASKINS AT 1212 ON 05.20.2017 BY NBROOKS    Culture   Final    NO GROWTH 2 DAYS Performed at [facility]    Report Status PENDING  Incomplete  MRSA PCR Screening     Status: None   Collection Time: 08/22/15 12:50 PM  Result Value Ref Range Status   MRSA by PCR NEGATIVE NEGATIVE Final    Comment:        The GeneXpert MRSA Assay (FDA approved for NASAL specimens only), is one component of a comprehensive MRSA colonization surveillance program. It is not intended to diagnose MRSA infection nor to guide or monitor treatment  for MRSA infections.      Studies: No results found.  Scheduled Meds: . acyclovir  750 mg Intravenous Q8H  . amoxicillin-clavulanate  1 tablet Oral Q12H  . aspirin  81 mg Oral Daily  . doxycycline (VIBRAMYCIN) IV  100 mg Intravenous Q12H  . folic acid  1 mg Oral Daily  . gabapentin  300 mg Oral QHS  . insulin aspart  0-5 Units Subcutaneous QHS  . insulin aspart  0-9 Units Subcutaneous TID WC  . labetalol  100 mg Oral BID  . LORazepam  0-4 mg Intravenous Q12H  . multivitamin with minerals  1 tablet Oral Daily  . nicotine  21 mg Transdermal Daily  . pantoprazole  40 mg Oral Q1200  . sodium chloride flush  3 mL Intravenous Q12H  . thiamine  100 mg Oral Daily   Or  . thiamine  100 mg Intravenous Daily  . vitamin C  250 mg Oral Daily    Continuous Infusions: . sodium chloride 50 mL/hr at 08/24/15 1211     Time spent:  Derhonda Eastlick MD, PhD  [facility] Pager [phone]. If 7PM-7AM, please contact night-coverage at [email or web address], password [facility] 08/24/2015, 3:55 PM  LOS: 1 day

## 2015-08-24 NOTE — Care Management Note (Signed)
Case Management Note  Patient Details  Name: Nicole Mendez MRN: [id number] Date of Birth: May 26, 1975  Subjective/Objective:  40 y/o f admitted w/Sepsis-meningitis. From home.                  Action/Plan:d/c plan home.   Expected Discharge Date:                 Expected Discharge Plan:  Home/Self Care  In-House Referral:     Discharge planning Services  CM Consult  Post Acute Care Choice:    Choice offered to:     DME Arranged:    DME Agency:     HH Arranged:    HH Agency:     Status of Service:  In process, will continue to follow  Medicare Important Message Given:    Date Medicare IM Given:    Medicare IM give by:    Date Additional Medicare IM Given:    Additional Medicare Important Message give by:     If discussed at Long Length of Stay Meetings, dates discussed:    Additional Comments:  Lanier ClamMahabir, Hazaiah [place], RN 08/24/2015, 3:38 PM

## 2015-08-24 NOTE — Progress Notes (Addendum)
Pt found a tick crawling on her arm this morning.  It has been placed in a specimen cup and the cup has been placed on pt sink in room.

## 2015-08-25 ENCOUNTER — Telehealth: Payer: Self-pay

## 2015-08-25 LAB — CBC
HEMATOCRIT: 32 % — AB (ref 36.0–46.0)
HEMOGLOBIN: 10 g/dL — AB (ref 12.0–15.0)
MCH: 24.6 pg — AB (ref 26.0–34.0)
MCHC: 31.3 g/dL (ref 30.0–36.0)
MCV: 78.8 fL (ref 78.0–100.0)
Platelets: 297 10*3/uL (ref 150–400)
RBC: 4.06 MIL/uL (ref 3.87–5.11)
RDW: 15.9 % — ABNORMAL HIGH (ref 11.5–15.5)
WBC: 8.6 10*3/uL (ref 4.0–10.5)

## 2015-08-25 LAB — EHRLICHIA ANTIBODY PANEL
E CHAFFEENSIS AB, IGG: NEGATIVE
E CHAFFEENSIS AB, IGM: NEGATIVE
E. CHAFFEENSIS (HME) IGM TITER: NEGATIVE
E. CHAFFEENSIS IGG AB: NEGATIVE

## 2015-08-25 LAB — GLUCOSE, CAPILLARY
GLUCOSE-CAPILLARY: 83 mg/dL (ref 65–99)
Glucose-Capillary: 108 mg/dL — ABNORMAL HIGH (ref 65–99)

## 2015-08-25 LAB — BASIC METABOLIC PANEL
Anion gap: 9 (ref 5–15)
BUN: 9 mg/dL (ref 6–20)
CHLORIDE: 103 mmol/L (ref 101–111)
CO2: 25 mmol/L (ref 22–32)
CREATININE: 1.05 mg/dL — AB (ref 0.44–1.00)
Calcium: 8.5 mg/dL — ABNORMAL LOW (ref 8.9–10.3)
GFR calc Af Amer: >60 mL/min (ref >60)
GFR calc non Af Amer: >60 mL/min (ref >60)
GLUCOSE: 113 mg/dL — AB (ref 65–99)
Potassium: 3.9 mmol/L (ref 3.5–5.1)
Sodium: 137 mmol/L (ref 135–145)

## 2015-08-25 LAB — MAGNESIUM: Magnesium: 1.8 mg/dL (ref 1.7–2.4)

## 2015-08-25 LAB — CSF CULTURE: CULTURE: NO GROWTH

## 2015-08-25 LAB — CSF CULTURE W GRAM STAIN: Gram Stain: NONE SEEN

## 2015-08-25 MED ORDER — ACYCLOVIR 400 MG PO TABS: 400.0000 mg | ORAL_TABLET | Freq: Three times a day (TID) | ORAL | Status: DC

## 2015-08-25 MED ORDER — DOXYCYCLINE HYCLATE 100 MG PO CAPS: 100.0000 mg | ORAL_CAPSULE | Freq: Two times a day (BID) | ORAL | Status: DC

## 2015-08-25 NOTE — Progress Notes (Signed)
Pharmacy Antibiotic Note  Nicole Mendez is a 40 y.o. female admitted on 08/20/2015 with possible  viral meningitis.  Pharmacy has been consulted for acyclovir dosing. HSV DNA per PCR from 5/20 is negative, but per discussion with MD on 5/22, acyclovir is to continue.  Pt is also on Augmentin 875 mg PO BID for dental infection and doxycycline 100 mg PO BID for tick bite.   Plan: Acyclovir 750 mg IV q8h, using Adjusted BW of 75.5 kg.  Note that SCr has significantly increased (0.54 > 1.05), current dosing remains appropriate though Per MD note 5/22, possible discharge today if patient is afebrile  Height: 5\' 1"  (154.9 cm) Weight: 257 lb 8 oz (116.8 kg) IBW/kg (Calculated) : 47.8  Temp (24hrs), Avg:98.4 F (36.9 C), Min:98 F (36.7 C), Max:99.1 F (37.3 C)   Recent Labs Lab 08/20/15 2314 08/21/15 0133 08/22/15 0505 08/23/15 0547 08/24/15 0517 08/25/15 0531  WBC  --  2.6* 6.4 8.8 6.8 8.6  CREATININE  --  0.54 0.57 0.65 0.54 1.05*  LATICACIDVEN 0.8 0.5  --  0.7  --   --     Estimated Creatinine Clearance: 83.9 mL/min (by C-G formula based on Cr of 1.05).    No Known Allergies  Antimicrobials this admission: 5/18 rocephin >> x1 ED 5/17 amoxicillin >> 5/20 5/18 doxycycline >>  5/20 Augmentin >> 5/20 acyclovir >>  Dose adjustments this admission: n/a  Microbiology results: 5/18 BCx: NGTD  5/18 UCx: NGF  5/20 CSF:  No organisms seen, WBC present, predominantly PNM 5/20 MRSA PCR: negative 5/20 B. Burgforfi ab: pending 5/20 HSV DNA per PCR: neg 5/20 Ehrlichia ab: pending 5/21 [place] virus: pending  Thank you for allowing pharmacy to be a part of this patient's care.  Loralee PacasErin Kosanke, PharmD, BCPS Pager: [phone]  08/25/2015 9:22 AM

## 2015-08-25 NOTE — Discharge Summary (Signed)
Discharge Summary  Nicole Mendez:[id number] DOB: 1975/07/19  PCP: Carollee Leitz, NP  Admit date: 08/20/2015 Discharge date: 08/25/2015  Time spent: <42mins  Recommendations for Outpatient Follow-up:  1. F/u with PMD within a week  for hospital discharge follow up, repeat cbc/bmp at follow up  Discharge Diagnoses:  Active Hospital Problems   Diagnosis Date Noted  . Sepsis (HCC) 08/20/2015  . Fever 08/20/2015  . Headache 08/20/2015  . Hypokalemia 08/20/2015  . Abdominal pain 08/20/2015  . Alcohol abuse   . Tobacco abuse   . Psoriasis   . Tick bite 08/19/2015  . Benign essential HTN 08/20/2014  . Obstructive apnea 08/20/2014  . Type 2 diabetes mellitus (HCC) 08/20/2014  . Severe obesity (BMI >= 40) (HCC) 01/10/2008    Resolved Hospital Problems   Diagnosis Date Noted Date Resolved  No resolved problems to display.    Discharge Condition: stable  Diet recommendation: heart healthy/carb modified  Filed Weights   08/20/15 2344  Weight: 116.8 kg (257 lb 8 oz)    History of present illness:  Chief Complaint: Tick bite, fever, headache and abdominal pain  HPI: Nicole Mendez is a 40 y.o. female with medical history significant of tobacco abuse, alcohol abuse, psoriasis (receiving injection of Ustekinumab q36month), hypertension, hyperlipidemia, diabetes mellitus, arthritis, obesity, OSA not on CPAP, who presents with tick bite, fever, headache and abdominal pain.  Patient reports that she had tick bite over right shoulder 4 days ago. She did not develop rashes. Yesterday she developed fever with temperature 101 and headache. Headache is located in the top of her head, constant, 7 out of 10 in severity, nonradiating. Patient does not have neck stiffness, neck pain, photophobia, confusion or altered mental status. She has mild dry cough which she attributes to smoking. She does not have chest pain, runny nose or sore throat. She states that she was started with  amoxicillin for dental pain by her dentist yesterday.  She reports mild abdominal pain in the past 2 or 3 days. Her abdominal pain is located in the periumbilical area, intermittent, 4 out of 10 in severity, nonradiating. He has some mild nausea, but no vomiting or diarrhea. Patient denies symptoms of UTI. She does not have unilateral weakness, numbness. No vision change or hearing loss. She states that she drinks 4 times a week, 2 cups of Vodka each time.   ED Course: pt was found to have WBC 3.8, temperature 103.1, tachycardia, tachypnea, potassium 2.9, creatinine normal, negative chest x-ray, urinalysis with small moderate leukocytes. ED physician tried to do LP without success. Pt is placed on tele bed for obs.  Hospital Course:  Principal Problem:   Sepsis (HCC) Active Problems:   Severe obesity (BMI >= 40) (HCC)   Benign essential HTN   Obstructive apnea   Type 2 diabetes mellitus (HCC)   Tick bite   Fever   Headache   Psoriasis   Tobacco abuse   Alcohol abuse   Hypokalemia   Abdominal pain  Sepsis (HCC):  presented on admission, Patient meets criteria for sepsis with WBC 3.8, fever, tachycardia and tachypnea. The source of infection is not clear, possibly due to dental infection. Or tick born disease? Meningitis? chest x-ray is negative. Urinalysis has small moderate leukocytes, but patient is asymptomatic. Lactic acid wnl, culture no growth so far, Resolved   Headache/light sensitivity: headache from tick born disease? From dental infection? CT head no acute findings. Patient has headache and fever, concerning for meningitis, but patient  does not have any meningeal signs. The neck is supple, no confusion or altered mental status. ED physician tried LP without success. Patient was started empirically with Rocephin and doxycycline by EDP.  Due to continue spiking fever and persistent severe headache with reported light sensitivity, lumbar puncture was done on 5/20 by  radiology,Csf with 1wbc, normal protein and glucose. Gram stain no organism seen, Aseptic /viral meningitis? Csf hsv pcr negative, patient report h/o genital herpes, but none recently, started acyclovir on 5/20  patient has been getting anti interleukin therapy q83months for psoriasis, also has diagnosis of noninsulin dependent dm2, thus has underline immunosuppression, Clinically improving, will continue doxy and acyclovir therapy.  Headache resolved at discharge , she is discharged with oral doxycycline and oral acyclovir, pmd to monitor cbc/bmp.  tick born disease?  she had a tick pulled out from her right shoulder on admission on 5/18, another tick was found crawling on her arm on 5/22 lyme titer negative, RSMF titer negative, ehrlichia titer negative, no diffuse skin rash, continue doxycycline.  Clinically patient has tick born disease, with fever, significant headache, tendency of neuropenis, wbc 2.6, serology negative could be due to early disease, or from on immunosuppressive meds. Better, discharged on oral doxy to finish total of 14 days treatment.  Abdominal pain: on presentation. Etiology is not clear. Lft/lipase wnl. Potential differential diagnosis is alcoholic gastritis. On physical examination, patient does not have acute abdomen. -Start protonix -When necessary Zofran for nausea - resolved  Benign essential HTN: Blood pressure 159/16. h/o medication noncompliance per pmd records -Continue labetalol -IV hydralazine when necessary  DM-II: Last A1c 6.2 on 06/22/10, well controled. Patient is taking metformin at home -SSI  Psoriasis: Patient has been followed up by dermatologist, Dr. Roseanne Reno. She is getting Ustekinumab injection q3h month -f/u Dr. Roseanne Reno  Hypokalemia/hypomagnesemia : K=2.9 on admission. - replace, keep k>4, mag >2  Tobacco abuse and Alcohol abuse: -Did counseling about importance of quitting smoking -Nicotine patch -Did counseling about the  importance of quitting drinking -CIWA protocol, no withdrawal symptom in the hospital  Morbid obesity: Body mass index is 48.68 kg/(m^2).  OSA not on cpap, patient states she is going to try to get cpap  Code Status: Full code Family Communication: None at bed side.  Disposition Plan: home on 5/23    Consultants:  Admitting MD Dr Clyde Lundborg talked to infectious disease Dr Luciana Axe  Procedures:  Lumbar puncture attempted at bedside by EDP without success  Fluoro guided lumbar puncture on 5/20  Antibiotics:  Rocephin x1  Doxycycline from admission to cover tick born disease  Amoxicillin from admission to cover teeth infection to 5/19  augmentin from 5/20  Discharge Exam: BP 150/88 mmHg  Pulse 92  Temp(Src) 98 F (36.7 C) (Oral)  Resp 22  Ht 5\' 1"  (1.549 m)  Wt 116.8 kg (257 lb 8 oz)  BMI 48.68 kg/m2  SpO2 97%  LMP 07/24/2015    General: NAD. obese  Cardiovascular: RRR  Respiratory: CTABL  Abdomen: Soft/ND/NT, positive BS  Musculoskeletal: No Edema  Neuro: aaox3  Skin: right should small tick bite mark with small central scab without erythema or induration. No significant psoriatic skin lesion on exam, no rash.  G Discharge Instructions You were cared for by a hospitalist during your hospital stay. If you have any questions about your discharge medications or the care you received while you were in the hospital after you are discharged, you can call the unit and asked to speak with the hospitalist  on call if the hospitalist that took care of you is not available. Once you are discharged, your primary care physician will handle any further medical issues. Please note that NO REFILLS for any discharge medications will be authorized once you are discharged, as it is imperative that you return to your primary care physician (or establish a relationship with a primary care physician if you do not have one) for your aftercare needs so that they can reassess your need for  medications and monitor your lab values.      Discharge Instructions    Diet - low sodium heart healthy    Complete by:  As directed   Carb modified     Increase activity slowly    Complete by:  As directed             Medication List    STOP taking these medications        amoxicillin 500 MG capsule  Commonly known as:  AMOXIL      TAKE these medications        acyclovir 400 MG tablet  Commonly known as:  ZOVIRAX  Take 1 tablet (400 mg total) by mouth 3 (three) times daily.     aspirin 81 MG tablet  Take 81 mg by mouth daily.     aspirin-acetaminophen-caffeine 250-250-65 MG tablet  Commonly known as:  EXCEDRIN MIGRAINE  Take 1 tablet by mouth every 6 (six) hours as needed for headache.     cyclobenzaprine 10 MG tablet  Commonly known as:  FLEXERIL  Take 1 tablet (10 mg total) by mouth 3 (three) times daily as needed for muscle spasms.     doxycycline 100 MG capsule  Commonly known as:  VIBRAMYCIN  Take 1 capsule (100 mg total) by mouth 2 (two) times daily.     gabapentin 300 MG capsule  Commonly known as:  NEURONTIN  Take 1 capsule (300 mg total) by mouth at bedtime.     labetalol 100 MG tablet  Commonly known as:  NORMODYNE  Take 1 tablet (100 mg total) by mouth 2 (two) times daily.     metFORMIN 500 MG tablet  Commonly known as:  GLUCOPHAGE  Take 1 tablet (500 mg total) by mouth 2 (two) times daily with a meal.     STELARA 90 MG/ML Sosy injection  Generic drug:  ustekinumab  every 3 (three) months.     vitamin C 100 MG tablet  Take 100 mg by mouth daily.       No Known Allergies Follow-up Information    Follow up with Tommie Sams, DO In 1 week.   Specialty:  Family Medicine   Why:  hospital discharge follow up, repeat cbc/bmp at follow up   Contact information:   [address] Laurell Josephs [address] [place] [address] [phone]        The results of significant diagnostics from this hospitalization (including imaging, microbiology,  ancillary and laboratory) are listed below for reference.    Significant Diagnostic Studies: Dg Chest 2 View  08/20/2015  CLINICAL DATA:  40 year old female with cough EXAM: CHEST  2 VIEW COMPARISON:  None. FINDINGS: The heart size and mediastinal contours are within normal limits. Both lungs are clear. The visualized skeletal structures are unremarkable. IMPRESSION: No active cardiopulmonary disease. Electronically Signed   By: Elgie Collard M.D.   On: 08/20/2015 23:09   Ct Head Wo Contrast  08/21/2015  CLINICAL DATA:  Fever, headache, abdominal pain, tick bite, history  smoking, alcohol abuse, psoriasis, hypertension, diabetes mellitus, sleep apnea EXAM: CT HEAD WITHOUT CONTRAST TECHNIQUE: Contiguous axial images were obtained from the base of the skull through the vertex without intravenous contrast. COMPARISON:  None FINDINGS: Normal ventricular morphology. No midline shift or mass effect. Normal appearance of brain parenchyma. No intracranial hemorrhage, mass lesion or evidence acute infarction. No extra-axial fluid collections. 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 Sinuses and mastoid air cells otherwise unremarkable. Osseous structures normal appearance. IMPRESSION: No acute intracranial abnormalities. Electronically Signed   By: Ulyses Southward M.D.   On: 08/21/2015 18:40   Dg Fluoro Guide Lumbar Puncture  08/22/2015  CLINICAL DATA:  40 year old female with a concern for meningitis. EXAM: DIAGNOSTIC LUMBAR PUNCTURE UNDER FLUOROSCOPIC GUIDANCE FLUOROSCOPY TIME:  Fluoroscopy Time (in minutes and seconds): 6 seconds PROCEDURE: Informed consent was obtained from the patient prior to the procedure, including potential complications of headache, allergy, and pain. With the patient prone, the lower back was prepped with Betadine. 1% Lidocaine was used for local anesthesia. Lumbar puncture was performed at the L3-L4 level using a 22 gauge needle with return of clear CSF with an opening  pressure of 24 cm water. 9.5 ml of CSF were obtained for laboratory studies. The patient tolerated the procedure well and there were no apparent complications. IMPRESSION: Status post fluoroscopy guided lumbar puncture at L3-L4 with clear CSF and opening pressure of 24 cm water. Signed, Yvone Neu. Loreta Ave, DO [facility] Electronically Signed   By: Gilmer Mor D.O.   On: 08/22/2015 11:41    Microbiology: Recent Results (from the past 240 hour(s))  Urine culture     Status: None   Collection Time: 08/20/15  6:00 PM  Result Value Ref Range Status   Specimen Description URINE, CLEAN CATCH  Final   Special Requests NONE  Final   Culture NO GROWTH Performed at [facility]   Final   Report Status 08/22/2015 FINAL  Final  Culture, blood (routine x 2)     Status: None (Preliminary result)   Collection Time: 08/20/15 11:13 PM  Result Value Ref Range Status   Specimen Description BLOOD LEFT ANTECUBITAL  Final   Special Requests BOTTLES DRAWN AEROBIC AND ANAEROBIC 5CC EA  Final   Culture   Final    NO GROWTH 4 DAYS Performed at [facility]    Report Status PENDING  Incomplete  Culture, blood (routine x 2)     Status: None (Preliminary result)   Collection Time: 08/20/15 11:21 PM  Result Value Ref Range Status   Specimen Description BLOOD BLOOD LEFT HAND  Final   Special Requests BOTTLES DRAWN AEROBIC AND ANAEROBIC 5CC EA  Final   Culture   Final    NO GROWTH 4 DAYS Performed at [facility]    Report Status PENDING  Incomplete  CSF culture     Status: None   Collection Time: 08/22/15 11:00 AM  Result Value Ref Range Status   Specimen Description CSF  Final   Special Requests NONE  Final   Gram Stain   Final    NO ORGANISMS SEEN WBC PRESENT, PREDOMINANTLY PMN CYTOSPUN Gram Stain Report Called to,Read Back By and Verified With: K HASKINS AT 1212 ON 05.20.2017 BY NBROOKS    Culture   Final    NO GROWTH 3  DAYS Performed at [facility]    Report Status 08/25/2015 FINAL  Final  MRSA PCR Screening  Status: None   Collection Time: 08/22/15 12:50 PM  Result Value Ref Range Status   MRSA by PCR NEGATIVE NEGATIVE Final    Comment:        The GeneXpert MRSA Assay (FDA approved for NASAL specimens only), is one component of a comprehensive MRSA colonization surveillance program. It is not intended to diagnose MRSA infection nor to guide or monitor treatment for MRSA infections.      Labs: Basic Metabolic Panel:  Recent Labs Lab 08/21/15 0133 08/22/15 0505 08/23/15 0547 08/24/15 0517 08/25/15 0531  NA 135 133* 134* 136 137  K 3.3* 3.6 4.0 3.5 3.9  CL 107 101 102 103 103  CO2 22 23 26 26 25   GLUCOSE 92 82 91 82 113*  BUN 6 5* 7 6 9   CREATININE 0.54 0.57 0.65 0.54 1.05*  CALCIUM 7.3* 8.2* 7.9* 8.1* 8.5*  MG 1.5* 1.6* 2.0  --  1.8   Liver Function Tests:  Recent Labs Lab 08/20/15 1813 08/23/15 0547  AST 31 29  ALT 19 23  ALKPHOS 73 68  BILITOT 0.4 0.6  PROT 7.0 6.5  ALBUMIN 3.7 2.9*    Recent Labs Lab 08/20/15 2311  LIPASE 20   No results for input(s): AMMONIA in the last 168 hours. CBC:  Recent Labs Lab 08/20/15 1813 08/21/15 0133 08/22/15 0505 08/23/15 0547 08/24/15 0517 08/25/15 0531  WBC 3.8* 2.6* 6.4 8.8 6.8 8.6  NEUTROABS 2.9  --  3.9  --   --   --   HGB 11.0* 9.8* 10.8* 9.8* 9.3* 10.0*  HCT 35.9* 31.4* 34.7* 31.2* 29.5* 32.0*  MCV 80.7 80.1 79.0 78.6 78.2 78.8  PLT 337 281 290 254 252 297   Cardiac Enzymes: No results for input(s): CKTOTAL, CKMB, CKMBINDEX, TROPONINI in the last 168 hours. BNP: BNP (last 3 results) No results for input(s): BNP in the last 8760 hours.  ProBNP (last 3 results) No results for input(s): PROBNP in the last 8760 hours.  CBG:  Recent Labs Lab 08/24/15 1120 08/24/15 1713 08/24/15 2113 08/25/15 0738 08/25/15 1140  GLUCAP 128* 98 94 83 108*       Signed:  Ceyda Peterka MD, PhD  [facility] 08/25/2015, 9:43 PM

## 2015-08-25 NOTE — Telephone Encounter (Signed)
Unable to reach patient.  No answer.  No voicemail.  Attempt to follow up with transitional care management.  Will continue to follow as appropriate.   

## 2015-08-25 NOTE — Care Management Note (Signed)
Case Management Note  Patient Details  Name: Nicole Mendez MRN: [id number] Date of Birth: [birth year]  Subjective/Objective:                    Action/Plan:d/c home no needs or orders.   Expected Discharge Date:                Expected Discharge Plan:  Home/Self Care  In-House Referral:     Discharge planning Services  CM Consult  Post Acute Care Choice:    Choice offered to:     DME Arranged:    DME Agency:     HH Arranged:    HH Agency:     Status of Service:  Completed, signed off  Medicare Important Message Given:    Date Medicare IM Given:    Medicare IM give by:    Date Additional Medicare IM Given:    Additional Medicare Important Message give by:     If discussed at Long Length of Stay Meetings, dates discussed:    Additional Comments:  Lanier ClamMahabir, Kenyatte Gruber, RN 08/25/2015, 12:45 PM

## 2015-08-26 LAB — CULTURE, BLOOD (ROUTINE X 2)
CULTURE: NO GROWTH
Culture: NO GROWTH

## 2015-08-27 ENCOUNTER — Telehealth: Payer: Self-pay

## 2015-08-27 NOTE — Telephone Encounter (Signed)
Called patient to follow up with transitional care management since she was discharged from hospital to home.  I stated who I am and my purpose for calling and mentioned the importance of making a hospital follow up appointment within 1 week of discharge.  She stated," I am not interested in seeing Dr. Salena Saner because he told me there was nothing to worry about in regards to the tick bite when I saw him last Wednesday and evidently he was wrong.  He even gave me written instruction telling me there was nothing to worry about.  I was not pleased with the way he addressed me in the office, his attitude did not fair well with me and there was a lack of compassion."  I then apologized for the unpleasing visit and any miscommunication on behalf of [facility].  I offered her an appointment with the other female physician in our ofice that is currently taking new patients.  She declined the appointment and hung up the phone.  I will follow up with female physicians within our system to see if she can be seen by someone within [facility].

## 2015-08-27 NOTE — Telephone Encounter (Signed)
Called patient to make her aware of availability with a female provider, Dr. Dan HumphreysWalker for her hospital follow up.  She said she appreciated the follow up and accepted the appointment.  Appointment has been scheduled.  When asked how she was doing she stated she is ok, but would like for me to call her back tomorrow because it was not a good time.  I will call her back tomorrow to follow as appropriate with transitional care management.

## 2015-08-28 ENCOUNTER — Telehealth: Payer: Self-pay

## 2015-08-28 NOTE — Telephone Encounter (Signed)
Unable to reach patient.  Attempt to continue to follow up with transitional care management.  Called all numbers listed.  No answer on home, cell or mother's number.  Not at work number listed.  Appointment previously scheduled with PCP 09/01/15 at 1:00.  Repeat CBC/BMP at hospital follow up.

## 2015-09-01 ENCOUNTER — Ambulatory Visit: Payer: 59 | Admitting: Internal Medicine

## 2015-09-01 ENCOUNTER — Telehealth: Payer: Self-pay | Admitting: Nurse Practitioner

## 2015-09-01 DIAGNOSIS — Z0289 Encounter for other administrative examinations: Secondary | ICD-10-CM

## 2015-09-01 NOTE — Telephone Encounter (Signed)
FYI, Pt missed appt pt thought that her appt was at 1:30p and she also thought it was lab appt. I resch pt for 06/09. Appt is still on the sch. Let me know if she will be charged? Thank you!

## 2015-09-03 LAB — B. BURGDORFI ANTIBODIES, CSF
ALBUMIN INDEX RATIO: 4.7
Albumin CSF: 16.5 mg/dL (ref 10.4–43.2)
Albumin Serum: 3500 mg/dl — ABNORMAL LOW (ref 3700–5100)
B. Burgdorferi IgG Serum: 0.27 Index (ref 0.00–0.80)
B. Burgdorferi IgM Ab Index: 1.1
B. Burgdorferi IgM Serum: 0.1 Index (ref 0.00–0.80)
B. burgdorferi IgM CSF: 0.14 Index — ABNORMAL HIGH (ref 0.00–0.06)
CNS IgG Synthesis Rate: 10
IGG (LOC): 1.755
IGG INDEX: 0.98
IGG TOTAL CSF: 5.3 mg/dL — AB (ref 0.00–3.06)
IGG TOTAL SERUM: 1151 mg/dL (ref 540–1423)
IGM TOTAL SERUM: 61 mg/dL (ref 52–217)
IgM Total CSF: <1.6 mg/dL (ref 0.0–1.6)

## 2015-09-11 ENCOUNTER — Ambulatory Visit: Payer: 59 | Admitting: Internal Medicine

## 2015-09-16 LAB — ARBOVIRUS PANEL, ~~LOC~~ LAB

## 2015-11-12 ENCOUNTER — Ambulatory Visit: Payer: 59 | Admitting: Family Medicine

## 2015-11-12 ENCOUNTER — Ambulatory Visit: Payer: 59 | Admitting: Nurse Practitioner

## 2015-12-02 ENCOUNTER — Ambulatory Visit: Payer: 59 | Admitting: Family

## 2015-12-02 DIAGNOSIS — Z0289 Encounter for other administrative examinations: Secondary | ICD-10-CM

## 2016-01-23 ENCOUNTER — Encounter (HOSPITAL_COMMUNITY): Payer: Self-pay | Admitting: Emergency Medicine

## 2016-01-23 ENCOUNTER — Emergency Department (HOSPITAL_COMMUNITY)
Admission: EM | Admit: 2016-01-23 | Discharge: 2016-01-23 | Disposition: A | Discharge status: Home / Self Care | Payer: 59 | Attending: Emergency Medicine | Admitting: Emergency Medicine

## 2016-01-23 DIAGNOSIS — Z7984 Long term (current) use of oral hypoglycemic drugs: Secondary | ICD-10-CM | POA: Diagnosis not present

## 2016-01-23 DIAGNOSIS — S0502XA Injury of conjunctiva and corneal abrasion without foreign body, left eye, initial encounter: Secondary | ICD-10-CM | POA: Diagnosis not present

## 2016-01-23 DIAGNOSIS — S0592XA Unspecified injury of left eye and orbit, initial encounter: Secondary | ICD-10-CM | POA: Diagnosis present

## 2016-01-23 DIAGNOSIS — Z7982 Long term (current) use of aspirin: Secondary | ICD-10-CM | POA: Insufficient documentation

## 2016-01-23 DIAGNOSIS — S0501XA Injury of conjunctiva and corneal abrasion without foreign body, right eye, initial encounter: Secondary | ICD-10-CM

## 2016-01-23 DIAGNOSIS — Y929 Unspecified place or not applicable: Secondary | ICD-10-CM | POA: Insufficient documentation

## 2016-01-23 DIAGNOSIS — F17210 Nicotine dependence, cigarettes, uncomplicated: Secondary | ICD-10-CM | POA: Insufficient documentation

## 2016-01-23 DIAGNOSIS — I10 Essential (primary) hypertension: Secondary | ICD-10-CM | POA: Diagnosis not present

## 2016-01-23 DIAGNOSIS — Y999 Unspecified external cause status: Secondary | ICD-10-CM | POA: Insufficient documentation

## 2016-01-23 DIAGNOSIS — X58XXXA Exposure to other specified factors, initial encounter: Secondary | ICD-10-CM | POA: Insufficient documentation

## 2016-01-23 DIAGNOSIS — E119 Type 2 diabetes mellitus without complications: Secondary | ICD-10-CM | POA: Diagnosis not present

## 2016-01-23 DIAGNOSIS — Y939 Activity, unspecified: Secondary | ICD-10-CM | POA: Diagnosis not present

## 2016-01-23 MED ORDER — ERYTHROMYCIN 5 MG/GM OP OINT: TOPICAL_OINTMENT | OPHTHALMIC | 0 refills | Status: DC

## 2016-01-23 MED ORDER — TETRACAINE HCL 0.5 % OP SOLN
2.0000 [drp] | Freq: Once | OPHTHALMIC | Status: AC
  Administered 2016-01-23: 2 [drp] via OPHTHALMIC
  Filled 2016-01-23: qty 2

## 2016-01-23 MED ORDER — HYDROCODONE-ACETAMINOPHEN 5-325 MG PO TABS
2.0000 | ORAL_TABLET | Freq: Once | ORAL | Status: AC
  Administered 2016-01-23: 1 via ORAL
  Filled 2016-01-23: qty 2

## 2016-01-23 MED ORDER — FLUORESCEIN SODIUM 1 MG OP STRP
1.0000 | ORAL_STRIP | Freq: Once | OPHTHALMIC | Status: AC
  Administered 2016-01-23: 1 via OPHTHALMIC
  Filled 2016-01-23: qty 1

## 2016-01-23 MED ORDER — HYDROCODONE-ACETAMINOPHEN 5-325 MG PO TABS: 1.0000 | ORAL_TABLET | Freq: Four times a day (QID) | ORAL | 0 refills | Status: DC | PRN

## 2016-01-23 NOTE — ED Provider Notes (Signed)
TIME SEEN: 4:40 AM  CHIEF COMPLAINT: Left eye pain  HPI: Pt is a 40 y.o. female with history of hypertension, diabetes, hyperlipidemia who presents to the emergency department with left eye pain. States that she has prescription for contacts and wore them all day yesterday and took them out at 10:30 PM last night. Woke up this morning unable to open her eye without pain. Has photophobia and watery drainage. No purulent drainage. No facial swelling. No fever. Has never had any eye surgery. Does not use eyedrops. States she normally wears glasses.  No injury to the eye.  ROS: See HPI Constitutional: no fever  Eyes: no drainage  ENT: no runny nose   Cardiovascular:  no chest pain  Resp: no SOB  GI: no vomiting GU: no dysuria Integumentary: no rash  Allergy: no hives  Musculoskeletal: no leg swelling  Neurological: no slurred speech ROS otherwise negative  PAST MEDICAL HISTORY/PAST SURGICAL HISTORY:  Past Medical History:  Diagnosis Date  . Alcohol abuse   . Arthritis   . Diabetes mellitus without complication (HCC) 2000  . Hyperlipidemia   . Hypertension   . Morbid obesity (HCC)   . OSA (obstructive sleep apnea)    Not on CPAP  . Psoriasis   . Tobacco abuse     MEDICATIONS:  Prior to Admission medications   Medication Sig Start Date End Date Taking? Authorizing Provider  acyclovir (ZOVIRAX) 400 MG tablet Take 1 tablet (400 mg total) by mouth 3 (three) times daily. 08/25/15   Albertine GratesFang Xu, MD  Ascorbic Acid (VITAMIN C) 100 MG tablet Take 100 mg by mouth daily.    Historical Provider, MD  aspirin 81 MG tablet Take 81 mg by mouth daily.    Historical Provider, MD  aspirin-acetaminophen-caffeine (EXCEDRIN MIGRAINE) [id number] MG tablet Take 1 tablet by mouth every 6 (six) hours as needed for headache.    Historical Provider, MD  cyclobenzaprine (FLEXERIL) 10 MG tablet Take 1 tablet (10 mg total) by mouth 3 (three) times daily as needed for muscle spasms. Patient not taking: Reported on  08/20/2015 04/14/15   Carollee Leitzarrie M Doss, NP  doxycycline (VIBRAMYCIN) 100 MG capsule Take 1 capsule (100 mg total) by mouth 2 (two) times daily. 08/25/15   Albertine GratesFang Xu, MD  gabapentin (NEURONTIN) 300 MG capsule Take 1 capsule (300 mg total) by mouth at bedtime. 07/13/15   Carollee Leitzarrie M Doss, NP  labetalol (NORMODYNE) 100 MG tablet Take 1 tablet (100 mg total) by mouth 2 (two) times daily. 05/14/15   Carollee Leitzarrie M Doss, NP  metFORMIN (GLUCOPHAGE) 500 MG tablet Take 1 tablet (500 mg total) by mouth 2 (two) times daily with a meal. Patient taking differently: Take 500 mg by mouth daily with breakfast.  05/14/15   Carollee Leitzarrie M Doss, NP  ustekinumab Marcy Panning(STELARA) 90 MG/ML SOSY injection every 3 (three) months. 06/03/14   Historical Provider, MD    ALLERGIES:  No Known Allergies  SOCIAL HISTORY:  Social History  Substance Use Topics  . Smoking status: Current Every Day Smoker    Packs/day: 0.50    Types: Cigarettes  . Smokeless tobacco: Never Used  . Alcohol use 0.0 oz/week     Comment: occasionally    FAMILY HISTORY: Family History  Problem Relation Age of Onset  . Alcohol abuse Maternal Grandmother   . Cancer Maternal Grandmother     Ovarian  . Hypertension Maternal Grandmother   . Diabetes Maternal Grandmother   . Hypertension Mother   . Diabetes Mother  EXAM: BP 174/99 (BP Location: Right Arm)   Pulse 90   Resp 16   Ht 5\' 4"  (1.626 m)   Wt 260 lb (117.9 kg)   LMP 01/08/2016   SpO2 99%   BMI 44.63 kg/m   97.8 F  oral CONSTITUTIONAL: Alert and oriented and responds appropriately to questions. Well-appearing; well-nourished HEAD: Normocephalic EYES: Conjunctiva clear on the right side, conjunctiva injected on the left, pupils equal reactive with normal red reflex, extraocular movements intact, normal funduscopic exam without papilledema, patient has a large corneal abrasion covering most of her iris and pupil of the left eye without ulceration, large amount of watering from the eye but no purulent drainage,  pressure of the left eye 17 mmHg, no facial erythema warmth or redness; patient does seem to have swelling of the left eyelid and when she opens her eyes she has a large amount of watery drainage and the swelling completely dissipates, patient has photophobia, once her eye has been anesthetized with tetracaine she states her vision is normal, see nursing notes for visual acuity ENT: normal nose; no rhinorrhea; moist mucous membranes NECK: Supple, no meningismus, no LAD  CARD: RRR; S1 and S2 appreciated; no murmurs, no clicks, no rubs, no gallops RESP: Normal chest excursion without splinting or tachypnea; breath sounds clear and equal bilaterally; no wheezes, no rhonchi, no rales, no hypoxia or respiratory distress, speaking full sentences ABD/GI: Normal bowel sounds; non-distended; soft, non-tender, no rebound, no guarding, no peritoneal signs BACK:  The back appears normal and is non-tender to palpation, there is no CVA tenderness EXT: Normal ROM in all joints; non-tender to palpation; no edema; normal capillary refill; no cyanosis, no calf tenderness or swelling    SKIN: Normal color for age and race; warm; no rash NEURO: Moves all extremities equally, sensation to light touch intact diffusely, cranial nerves II through XII intact PSYCH: The patient's mood and manner are appropriate. Grooming and personal hygiene are appropriate.  MEDICAL DECISION MAKING: Patient here with large corneal abrasion. She has removed her contacts. No corneal ulceration. Pressure of the eye is normal. No facial cellulitis. Normal funduscopic exam. Will discharge with erythromycin ointment and pain medication. Will get outpatient ophthalmology follow-up information. Recommended she do not use her contacts until her symptoms have completely resolved. She has glasses to use at home. Have advised that she does not work or drive a car until her vision is back to normal. Discussed return precautions. She is comfortable with this  plan.  At this time, I do not feel there is any life-threatening condition present. I have reviewed and discussed all results (EKG, imaging, lab, urine as appropriate), exam findings with patient/family. I have reviewed nursing notes and appropriate previous records.  I feel the patient is safe to be discharged home without further emergent workup and can continue workup as an outpatient as needed. Discussed usual and customary return precautions. Patient/family verbalize understanding and are comfortable with this plan.  Outpatient follow-up has been provided. All questions have been answered.      Nicole Maw Ward, DO 01/23/16 [phone]

## 2016-01-23 NOTE — ED Triage Notes (Signed)
Pt in from home with c/o L sided eye/facial swelling since yesterday. Pt states she seldom wears contacts, but put them in yesterday morning. Pt went to bed, woke up this morning unable to open L eye, reports watery drainage from eye. Notable swelling to L side of face, neck. Airway clear, intact, denies sob.

## 2016-01-23 NOTE — Discharge Instructions (Signed)
Please do not wear contacts at least for the next week. Please use your glasses. Please follow-up with ophthalmology at the beginning of next week.

## 2016-02-02 ENCOUNTER — Ambulatory Visit (INDEPENDENT_AMBULATORY_CARE_PROVIDER_SITE_OTHER): Payer: 59 | Admitting: Family

## 2016-02-02 ENCOUNTER — Encounter: Payer: Self-pay | Admitting: Family

## 2016-02-02 VITALS — BP 126/80 | HR 98 | Temp 98.1°F | Wt 271.6 lb

## 2016-02-02 DIAGNOSIS — S46812A Strain of other muscles, fascia and tendons at shoulder and upper arm level, left arm, initial encounter: Secondary | ICD-10-CM

## 2016-02-02 DIAGNOSIS — Z1239 Encounter for other screening for malignant neoplasm of breast: Secondary | ICD-10-CM

## 2016-02-02 DIAGNOSIS — Z0000 Encounter for general adult medical examination without abnormal findings: Secondary | ICD-10-CM | POA: Insufficient documentation

## 2016-02-02 DIAGNOSIS — L408 Other psoriasis: Secondary | ICD-10-CM | POA: Diagnosis not present

## 2016-02-02 DIAGNOSIS — Z1231 Encounter for screening mammogram for malignant neoplasm of breast: Secondary | ICD-10-CM | POA: Diagnosis not present

## 2016-02-02 MED ORDER — CYCLOBENZAPRINE HCL 10 MG PO TABS: 10.0000 mg | ORAL_TABLET | Freq: Every evening | ORAL | 0 refills | Status: DC | PRN

## 2016-02-02 NOTE — Patient Instructions (Addendum)
You are overdue on health maintenance, please make a follow-up appointment for physical at checkout.  Suspect muscle strain- let me know if not better on flexeril.

## 2016-02-02 NOTE — Assessment & Plan Note (Signed)
Some improvement with capsaicin, heat. Suspect muscle strain, spasm. Patient and I agreed on conservative management at this time and Flexeril at bedtime. Return precautions given.

## 2016-02-02 NOTE — Progress Notes (Signed)
Subjective:    Patient ID: Nicole Mendez, female    DOB: 1975-08-19, 40 y.o.   MRN: [id number]  CC: Nicole Mendez is a 40 y.o. female who presents today for an acute visit.    HPI: Patient here for acute visit with chief complaint of left neck pain and left shoulder pain, unchanged after bumping into amoir when cleaning.Ache. Describes tingling sensation in shoulder which does not radiate. She also complains of "lump" of left breast she thinks she "hit her breast". Tried capsaicin, heating pad with some relief. Denies exertional chest pain or pressure, numbness or tingling radiating to left arm or jaw, palpitations, dizziness, frequent headaches, changes in vision, or shortness of breath.    H/o neck pain for 10 years. Had taken flexeril PRN for neck pain.   Psoriasis- follows with Derm. On stelara.     HISTORY:  Past Medical History:  Diagnosis Date  . Alcohol abuse   . Arthritis   . Diabetes mellitus without complication (HCC) 2000  . Hyperlipidemia   . Hypertension   . Morbid obesity (HCC)   . OSA (obstructive sleep apnea)    Not on CPAP  . Psoriasis   . Tobacco abuse    History reviewed. No pertinent surgical history. Family History  Problem Relation Age of Onset  . Alcohol abuse Maternal Grandmother   . Cancer Maternal Grandmother     Ovarian  . Hypertension Maternal Grandmother   . Diabetes Maternal Grandmother   . Hypertension Mother   . Diabetes Mother     Allergies: Review of patient's allergies indicates no known allergies. Current Outpatient Prescriptions on File Prior to Visit  Medication Sig Dispense Refill  . acyclovir (ZOVIRAX) 400 MG tablet Take 1 tablet (400 mg total) by mouth 3 (three) times daily. 6 tablet 0  . Ascorbic Acid (VITAMIN C) 100 MG tablet Take 100 mg by mouth daily.    Marland Kitchen aspirin 81 MG tablet Take 81 mg by mouth daily.    Marland Kitchen aspirin-acetaminophen-caffeine (EXCEDRIN MIGRAINE) 250-250-65 MG tablet Take 1 tablet by mouth every 6  (six) hours as needed for headache.    . labetalol (NORMODYNE) 100 MG tablet Take 1 tablet (100 mg total) by mouth 2 (two) times daily. 180 tablet 1  . metFORMIN (GLUCOPHAGE) 500 MG tablet Take 1 tablet (500 mg total) by mouth 2 (two) times daily with a meal. (Patient taking differently: Take 500 mg by mouth daily with breakfast. ) 180 tablet 1  . ustekinumab (STELARA) 90 MG/ML SOSY injection every 3 (three) months.     No current facility-administered medications on file prior to visit.     Social History  Substance Use Topics  . Smoking status: Former Smoker    Packs/day: 0.50    Types: Cigarettes  . Smokeless tobacco: Never Used     Comment: stopped 11/2015  . Alcohol use 0.0 oz/week     Comment: occasionally    Review of Systems  Constitutional: Negative for chills and fever.  Respiratory: Negative for cough.   Cardiovascular: Negative for chest pain and palpitations.  Gastrointestinal: Negative for nausea and vomiting.  Musculoskeletal: Positive for neck pain. Negative for myalgias.      Objective:    BP 126/80   Pulse 98   Temp 98.1 F (36.7 C) (Oral)   Wt 271 lb 9.6 oz (123.2 kg)   LMP 01/08/2016   SpO2 97%   BMI 46.62 kg/m    Physical Exam  Constitutional: She appears well-developed  and well-nourished.  Eyes: Conjunctivae are normal.  Neck: Normal range of motion. Neck supple. Muscular tenderness present. No spinous process tenderness present. No neck rigidity. No edema, no erythema and normal range of motion present.  Cardiovascular: Normal rate, regular rhythm, normal heart sounds and normal pulses.   Pulmonary/Chest: Effort normal and breath sounds normal. She has no wheezes. She has no rhonchi. She has no rales. Right breast exhibits no inverted nipple, no mass, no nipple discharge, no skin change and no tenderness. Left breast exhibits no inverted nipple, no mass, no nipple discharge, no skin change and no tenderness. Breasts are symmetrical.    CBE  performed.   Musculoskeletal:       Left shoulder: She exhibits normal range of motion and no tenderness.  Negative spurling's test. Spasm noted over left trapezius.   Left Shoulder:   No asymmetry of shoulders when comparing right and left.No pain with palpation over glenohumeral joint lines, [place] joint, AC joint, or bicipital groove. No pain with internal and external rotation. No pain with resisted lateral extension .   Negative active painful arc sign.   Strength and sensation normal BUE's.   Neurological: She is alert.  Skin: Skin is warm and dry.  Psychiatric: She has a normal mood and affect. Her speech is normal and behavior is normal. Thought content normal.  Vitals reviewed.      Assessment & Plan:   Problem List Items Addressed This Visit      Musculoskeletal and Integument   PSORIASIS    Stable. ON stelara. Follows with dermatology.      Trapezius strain - Primary    Some improvement with capsaicin, heat. Suspect muscle strain, spasm. Patient and I agreed on conservative management at this time and Flexeril at bedtime. Return precautions given.      Relevant Medications   cyclobenzaprine (FLEXERIL) 10 MG tablet     Other   Screening for breast cancer    I did not appreciate any masses during reast exam. Patient is due for mammogram at this time. We jointly thought prudent to go ahead and schedule this.      Relevant Orders   MM DIGITAL SCREENING BILATERAL    Other Visit Diagnoses   None.       I have discontinued Nicole Mendez's gabapentin, doxycycline, erythromycin, and HYDROcodone-acetaminophen. I have also changed her cyclobenzaprine. Additionally, I am having her maintain her aspirin, vitamin C, ustekinumab, metFORMIN, labetalol, aspirin-acetaminophen-caffeine, and acyclovir.   Meds ordered this encounter  Medications  . cyclobenzaprine (FLEXERIL) 10 MG tablet    Sig: Take 1 tablet (10 mg total) by mouth at bedtime as needed for muscle spasms.      Dispense:  20 tablet    Refill:  0    Order Specific Question:   Supervising Provider    Answer:   Sherlene ShamsULLO, TERESA L [[id number]]    Return precautions given.   Risks, benefits, and alternatives of the medications and treatment plan prescribed today were discussed, and patient expressed understanding.   Education regarding symptom management and diagnosis given to patient on AVS.  Continue to follow with Rennie PlowmanMargaret Arnett, FNP for routine health maintenance.   Nicole MareLatisha Y Mendez and I agreed with plan.   Rennie PlowmanMargaret Arnett, FNP

## 2016-02-02 NOTE — Assessment & Plan Note (Signed)
Stable. ON stelara. Follows with dermatology.

## 2016-02-02 NOTE — Assessment & Plan Note (Signed)
I did not appreciate any masses during reast exam. Patient is due for mammogram at this time. We jointly thought prudent to go ahead and schedule this.

## 2016-02-03 ENCOUNTER — Telehealth: Payer: Self-pay | Admitting: Family

## 2016-02-03 NOTE — Telephone Encounter (Signed)
Pt called, she saw Arnett yesterday about her neck and shoulder. She is still not feeling better. cyclobenzaprine (FLEXERIL) 10 MG tablet is making her too sleepy. Please call her at [phone].

## 2016-02-04 NOTE — Telephone Encounter (Signed)
Medication has already been refilled.

## 2016-02-05 ENCOUNTER — Telehealth: Payer: Self-pay | Admitting: *Deleted

## 2016-02-05 DIAGNOSIS — M62838 Other muscle spasm: Secondary | ICD-10-CM

## 2016-02-05 MED ORDER — IBUPROFEN 600 MG PO TABS: 600.0000 mg | ORAL_TABLET | Freq: Three times a day (TID) | ORAL | 0 refills | Status: DC | PRN

## 2016-02-05 NOTE — Telephone Encounter (Signed)
Please advise 

## 2016-02-05 NOTE — Telephone Encounter (Signed)
Call patient-   So sorry to hear that.  Muscle relaxant are best at night. During day, best med is NSAID. I sent over rx for ibuprofen.  Must take with food as can cause GI upset.  Please encourage heat and gentle stretching.  Muscle spasm can take to heal.   Hang in there.

## 2016-02-05 NOTE — Telephone Encounter (Signed)
Pt. Was seen on 02-02-16, she was seen for neck pain, pt requested to have something else prescribed for neck pain,due to continued pain. Pt contact [phone]

## 2016-02-05 NOTE — Telephone Encounter (Signed)
Patient has been informed.

## 2016-02-17 ENCOUNTER — Encounter: Payer: Self-pay | Admitting: Family

## 2016-03-30 ENCOUNTER — Ambulatory Visit: Payer: 59 | Attending: Family

## 2016-04-20 ENCOUNTER — Encounter: Payer: 59 | Admitting: Family

## 2016-08-15 ENCOUNTER — Ambulatory Visit (INDEPENDENT_AMBULATORY_CARE_PROVIDER_SITE_OTHER): Payer: 59 | Admitting: Family

## 2016-08-15 ENCOUNTER — Encounter: Payer: Self-pay | Admitting: Family

## 2016-08-15 VITALS — BP 156/94 | HR 112 | Temp 98.3°F | Ht 64.0 in | Wt 301.6 lb

## 2016-08-15 DIAGNOSIS — I10 Essential (primary) hypertension: Secondary | ICD-10-CM

## 2016-08-15 MED ORDER — AMLODIPINE BESYLATE 5 MG PO TABS: 5.0000 mg | ORAL_TABLET | Freq: Every day | ORAL | 3 refills | Status: DC

## 2016-08-15 MED ORDER — LISINOPRIL 5 MG PO TABS: 5.0000 mg | ORAL_TABLET | Freq: Every day | ORAL | 3 refills | Status: DC

## 2016-08-15 NOTE — Assessment & Plan Note (Signed)
Elevated. Suspect LE swelling from norvasc as bilateral. Will decrease norvasc and start lisinopril ( h/o DM). CMP in 5 days.

## 2016-08-15 NOTE — Progress Notes (Signed)
Subjective:    Patient ID: Nicole Mendez, female    DOB: 04-Feb-1976, 41 y.o.   MRN: [id number]  CC: Nicole Mendez is a 41 y.o. female who presents today for an acute visit.    HPI: CC: feet swelling x 2 weeks, waxing and waning.  Improves with elevation.  No SOB, CP.   No h/o CHF, DVT.  HTN- recently went back on norvasc - as had been on it for years and taken off labetalol for elevated BP. Has also been on lisinopril in the past  Due for cpe     HISTORY:  Past Medical History:  Diagnosis Date  . Alcohol abuse   . Arthritis   . Diabetes mellitus without complication (HCC) 2000  . Hyperlipidemia   . Hypertension   . Morbid obesity (HCC)   . OSA (obstructive sleep apnea)    Not on CPAP  . Psoriasis   . Tobacco abuse    No past surgical history on file. Family History  Problem Relation Age of Onset  . Alcohol abuse Maternal Grandmother   . Cancer Maternal Grandmother        Ovarian  . Hypertension Maternal Grandmother   . Diabetes Maternal Grandmother   . Hypertension Mother   . Diabetes Mother     Allergies: Patient has no known allergies. Current Outpatient Prescriptions on File Prior to Visit  Medication Sig Dispense Refill  . acyclovir (ZOVIRAX) 400 MG tablet Take 1 tablet (400 mg total) by mouth 3 (three) times daily. 6 tablet 0  . Ascorbic Acid (VITAMIN C) 100 MG tablet Take 100 mg by mouth daily.    Marland Kitchen. aspirin 81 MG tablet Take 81 mg by mouth daily.    Marland Kitchen. aspirin-acetaminophen-caffeine (EXCEDRIN MIGRAINE) 250-250-65 MG tablet Take 1 tablet by mouth every 6 (six) hours as needed for headache.    . cyclobenzaprine (FLEXERIL) 10 MG tablet Take 1 tablet (10 mg total) by mouth at bedtime as needed for muscle spasms. 20 tablet 0  . ibuprofen (ADVIL,MOTRIN) 600 MG tablet Take 1 tablet (600 mg total) by mouth every 8 (eight) hours as needed. 30 tablet 0  . metFORMIN (GLUCOPHAGE) 500 MG tablet Take 1 tablet (500 mg total) by mouth 2 (two) times daily with  a meal. (Patient taking differently: Take 500 mg by mouth daily with breakfast. ) 180 tablet 1  . ustekinumab (STELARA) 90 MG/ML SOSY injection every 3 (three) months.     No current facility-administered medications on file prior to visit.     Social History  Substance Use Topics  . Smoking status: Former Smoker    Packs/day: 0.50    Types: Cigarettes  . Smokeless tobacco: Never Used     Comment: stopped 11/2015  . Alcohol use 0.0 oz/week     Comment: occasionally    Review of Systems  Constitutional: Negative for chills and fever.  Eyes: Negative for visual disturbance.  Respiratory: Negative for cough.   Cardiovascular: Negative for chest pain and palpitations.  Gastrointestinal: Negative for nausea and vomiting.  Neurological: Negative for headaches.      Objective:    BP (!) 156/94   Pulse (!) 112   Temp 98.3 F (36.8 C) (Oral)   Ht 5\' 4"  (1.626 m)   Wt (!) 301 lb 9.6 oz (136.8 kg)   SpO2 97%   BMI 51.77 kg/m    Physical Exam  Constitutional: She appears well-developed and well-nourished.  Eyes: Conjunctivae are normal.  Cardiovascular: Normal rate,  regular rhythm, normal heart sounds and normal pulses.   Trace bilateral LE edema. No palpable cords or masses. No erythema or increased warmth. No asymmetry in calf size when compared bilaterally LE hair growth symmetric and present. No discoloration of varicosities noted. LE warm and palpable pedal pulses.    Pulmonary/Chest: Effort normal and breath sounds normal. She has no wheezes. She has no rhonchi. She has no rales.  Neurological: She is alert.  Skin: Skin is warm and dry.  Psychiatric: She has a normal mood and affect. Her speech is normal and behavior is normal. Thought content normal.  Vitals reviewed.      Assessment & Plan:   Problem List Items Addressed This Visit      Cardiovascular and Mediastinum   Benign essential HTN - Primary    Elevated. Suspect LE swelling from norvasc as bilateral.  Will decrease norvasc and start lisinopril ( h/o DM). CMP in 5 days.       Relevant Medications   amLODipine (NORVASC) 5 MG tablet   lisinopril (PRINIVIL,ZESTRIL) 5 MG tablet   Other Relevant Orders   CBC with Differential/Platelet   Comprehensive metabolic panel   Hemoglobin A1c   Lipid panel   TSH   VITAMIN D 25 Hydroxy (Vit-D Deficiency, Fractures)   Microalbumin / creatinine urine ratio        I have discontinued Nicole Mendez labetalol. I am also having her start on amLODipine and lisinopril. Additionally, I am having her maintain her aspirin, vitamin C, ustekinumab, metFORMIN, aspirin-acetaminophen-caffeine, acyclovir, cyclobenzaprine, and ibuprofen.   Meds ordered this encounter  Medications  . amLODipine (NORVASC) 5 MG tablet    Sig: Take 1 tablet (5 mg total) by mouth daily.    Dispense:  90 tablet    Refill:  3    Order Specific Question:   Supervising Provider    Answer:   Duncan Dull L [[id number]]  . lisinopril (PRINIVIL,ZESTRIL) 5 MG tablet    Sig: Take 1 tablet (5 mg total) by mouth daily.    Dispense:  90 tablet    Refill:  3    Order Specific Question:   Supervising Provider    Answer:   Sherlene Shams [[id number]]    Return precautions given.   Risks, benefits, and alternatives of the medications and treatment plan prescribed today were discussed, and patient expressed understanding.   Education regarding symptom management and diagnosis given to patient on AVS.  Continue to follow with Allegra Grana, FNP for routine health maintenance.   Nicole Mare and I agreed with plan.   Rennie Plowman, FNP

## 2016-08-15 NOTE — Progress Notes (Signed)
Pre visit review using our clinic review tool, if applicable. No additional management support is needed unless otherwise documented below in the visit note. 

## 2016-08-15 NOTE — Patient Instructions (Addendum)
Decrease norvasc to 5mg  ( new rx sent)  Decrease salt in diet  Start lisinopril.   Monitor BP - goal < 135/85.   Fasting labs on Friday morning  Return for physical  Next week.   Managing Your Hypertension Hypertension is commonly called high blood pressure. This is when the force of your blood pressing against the walls of your arteries is too strong. Arteries are blood vessels that carry blood from your heart throughout your body. Hypertension forces the heart to work harder to pump blood, and may cause the arteries to become narrow or stiff. Having untreated or uncontrolled hypertension can cause heart attack, stroke, kidney disease, and other problems. What are blood pressure readings? A blood pressure reading consists of a higher number over a lower number. Ideally, your blood pressure should be below 120/80. The first ("top") number is called the systolic pressure. It is a measure of the pressure in your arteries as your heart beats. The second ("bottom") number is called the diastolic pressure. It is a measure of the pressure in your arteries as the heart relaxes. What does my blood pressure reading mean? Blood pressure is classified into four stages. Based on your blood pressure reading, your health care provider may use the following stages to determine what type of treatment you need, if any. Systolic pressure and diastolic pressure are measured in a unit called mm Hg. Normal   Systolic pressure: below 120.  Diastolic pressure: below 80. Elevated   Systolic pressure: 120-129.  Diastolic pressure: below 80. Hypertension stage 1     Diastolic pressure: 80-89. Hypertension stage 2   Systolic pressure: 140 or above.  Diastolic pressure: 90 or above. What health risks are associated with hypertension? Managing your hypertension is an important responsibility. Uncontrolled hypertension can lead to:  A heart attack.  A stroke.  A weakened blood vessel  (aneurysm).  Heart failure.  Kidney damage.  Eye damage.  Metabolic syndrome.  Memory and concentration problems. What changes can I make to manage my hypertension? Eating and drinking   Eat a diet that is high in fiber and potassium, and low in salt (sodium), added sugar, and fat. An example eating plan is called the DASH (Dietary Approaches to Stop Hypertension) diet. To eat this way:  Eat plenty of fresh fruits and vegetables. Try to fill half of your plate at each meal with fruits and vegetables.  Eat whole grains, such as whole wheat pasta, brown rice, or whole grain bread. Fill about one quarter of your plate with whole grains.  Eat low-fat diary products.  Avoid fatty cuts of meat, processed or cured meats, and poultry with skin. Fill about one quarter of your plate with lean proteins such as fish, chicken without skin, beans, eggs, and tofu.  Avoid premade and processed foods. These tend to be higher in sodium, added sugar, and fat.     Lifestyle   Work with your health care provider to maintain a healthy body weight, or to lose weight. Ask what an ideal weight is for you.  Get at least 30 minutes of exercise that causes your heart to beat faster (aerobic exercise) most days of the week. Activities may include walking, swimming, or biking.       Monitoring   Monitor your blood pressure at home as told by your health care provider. Your personal target blood pressure may vary depending on your medical conditions, your age, and other factors.  Have your blood pressure checked regularly, as  often as told by your health care provider. Working with your health care provider   Review all the medicines you take with your health care provider because there may be side effects or interactions.  Talk with your health care provider about your diet, exercise habits, and other lifestyle factors that may be contributing to hypertension.  Visit your health care provider  regularly. Your health care provider can help you create and adjust your plan for managing hypertension. Will I need medicine to control my blood pressure? Your health care provider may prescribe medicine if lifestyle changes are not enough to get your blood pressure under control, and if:  Your systolic blood pressure is 130 or higher.  Your diastolic blood pressure is 80 or higher. Take medicines only as told by your health care provider. Follow the directions carefully. Blood pressure medicines must be taken as prescribed. The medicine does not work as well when you skip doses. Skipping doses also puts you at risk for problems. Contact a health care provider if:  You think you are having a reaction to medicines you have taken.  You have repeated (recurrent) headaches.  You feel dizzy.  You have swelling in your ankles.  You have trouble with your vision. Get help right away if:  You develop a severe headache or confusion.  You have unusual weakness or numbness, or you feel faint.  You have severe pain in your chest or abdomen.  You vomit repeatedly.  You have trouble breathing. Summary  Hypertension is when the force of blood pumping through your arteries is too strong. If this condition is not controlled, it may put you at risk for serious complications.  Your personal target blood pressure may vary depending on your medical conditions, your age, and other factors. For most people, a normal blood pressure is less than 120/80.  Hypertension is managed by lifestyle changes, medicines, or both. Lifestyle changes include weight loss, eating a healthy, low-sodium diet, exercising more, and limiting alcohol. This information is not intended to replace advice given to you by your health care provider. Make sure you discuss any questions you have with your health care provider. Document Released: 12/14/2011 Document Revised: 02/17/2016 Document Reviewed: 02/17/2016 Elsevier  Interactive Patient Education  2017 [employer].

## 2016-08-16 ENCOUNTER — Telehealth: Payer: Self-pay | Admitting: *Deleted

## 2016-08-16 NOTE — Telephone Encounter (Signed)
Patient has been notified

## 2016-08-16 NOTE — Telephone Encounter (Signed)
Patient requested to know if her Rx from 05/14 was sent over to [facility] Pt contact [phone]

## 2016-08-16 NOTE — Telephone Encounter (Signed)
Patient has been refilled. 

## 2016-08-19 ENCOUNTER — Other Ambulatory Visit (INDEPENDENT_AMBULATORY_CARE_PROVIDER_SITE_OTHER): Payer: 59

## 2016-08-19 DIAGNOSIS — I10 Essential (primary) hypertension: Secondary | ICD-10-CM | POA: Diagnosis not present

## 2016-08-19 NOTE — Addendum Note (Signed)
Addended by: Warden FillersWRIGHT, Jeven Topper S on: 08/19/2016 08:15 AM   Modules accepted: Orders

## 2016-08-20 LAB — COMPREHENSIVE METABOLIC PANEL
ALBUMIN: 4 g/dL (ref 3.5–5.5)
ALT: 10 IU/L (ref 0–32)
AST: 14 IU/L (ref 0–40)
Albumin/Globulin Ratio: 1.3 (ref 1.2–2.2)
Alkaline Phosphatase: 94 IU/L (ref 39–117)
BUN/Creatinine Ratio: 18 (ref 9–23)
BUN: 10 mg/dL (ref 6–24)
Bilirubin Total: 0.2 mg/dL (ref 0.0–1.2)
CALCIUM: 8.6 mg/dL — AB (ref 8.7–10.2)
CO2: 25 mmol/L (ref 18–29)
CREATININE: 0.56 mg/dL — AB (ref 0.57–1.00)
Chloride: 102 mmol/L (ref 96–106)
GFR calc Af Amer: 133 mL/min/{1.73_m2} (ref >59)
GFR, EST NON AFRICAN AMERICAN: 115 mL/min/{1.73_m2} (ref >59)
GLOBULIN, TOTAL: 3.1 g/dL (ref 1.5–4.5)
GLUCOSE: 93 mg/dL (ref 65–99)
Potassium: 4.1 mmol/L (ref 3.5–5.2)
SODIUM: 140 mmol/L (ref 134–144)
Total Protein: 7.1 g/dL (ref 6.0–8.5)

## 2016-08-20 LAB — CBC WITH DIFFERENTIAL/PLATELET
BASOS: 1 %
Basophils Absolute: 0.1 10*3/uL (ref 0.0–0.2)
EOS (ABSOLUTE): 0.2 10*3/uL (ref 0.0–0.4)
Eos: 3 %
HEMOGLOBIN: 10.7 g/dL — AB (ref 11.1–15.9)
Hematocrit: 34.3 % (ref 34.0–46.6)
IMMATURE GRANS (ABS): 0 10*3/uL (ref 0.0–0.1)
IMMATURE GRANULOCYTES: 0 %
LYMPHS: 39 %
Lymphocytes Absolute: 2.5 10*3/uL (ref 0.7–3.1)
MCH: 24.3 pg — ABNORMAL LOW (ref 26.6–33.0)
MCHC: 31.2 g/dL — ABNORMAL LOW (ref 31.5–35.7)
MCV: 78 fL — AB (ref 79–97)
MONOCYTES: 7 %
Monocytes Absolute: 0.5 10*3/uL (ref 0.1–0.9)
NEUTROS ABS: 3.1 10*3/uL (ref 1.4–7.0)
NEUTROS PCT: 50 %
PLATELETS: 533 10*3/uL — AB (ref 150–379)
RBC: 4.4 x10E6/uL (ref 3.77–5.28)
RDW: 15.7 % — ABNORMAL HIGH (ref 12.3–15.4)
WBC: 6.3 10*3/uL (ref 3.4–10.8)

## 2016-08-20 LAB — LIPID PANEL
CHOLESTEROL TOTAL: 177 mg/dL (ref 100–199)
Chol/HDL Ratio: 3.3 ratio (ref 0.0–4.4)
HDL: 53 mg/dL (ref >39)
LDL Calculated: 109 mg/dL — ABNORMAL HIGH (ref 0–99)
Triglycerides: 74 mg/dL (ref 0–149)
VLDL CHOLESTEROL CAL: 15 mg/dL (ref 5–40)

## 2016-08-20 LAB — HEMOGLOBIN A1C
Est. average glucose Bld gHb Est-mCnc: 137 mg/dL
HEMOGLOBIN A1C: 6.4 % — AB (ref 4.8–5.6)

## 2016-08-20 LAB — MICROALBUMIN / CREATININE URINE RATIO
Creatinine, Urine: 43 mg/dL
MICROALB/CREAT RATIO: 13.5 mg/g{creat} (ref 0.0–30.0)
MICROALBUM., U, RANDOM: 5.8 ug/mL

## 2016-08-20 LAB — VITAMIN D 25 HYDROXY (VIT D DEFICIENCY, FRACTURES): VIT D 25 HYDROXY: 20.5 ng/mL — AB (ref 30.0–100.0)

## 2016-08-20 LAB — TSH: TSH: 2.29 u[IU]/mL (ref 0.450–4.500)

## 2016-08-22 ENCOUNTER — Ambulatory Visit: Payer: 59 | Admitting: Obstetrics & Gynecology

## 2016-08-22 ENCOUNTER — Other Ambulatory Visit: Payer: Self-pay | Admitting: Family

## 2016-08-22 DIAGNOSIS — D649 Anemia, unspecified: Secondary | ICD-10-CM | POA: Insufficient documentation

## 2016-08-22 DIAGNOSIS — E785 Hyperlipidemia, unspecified: Secondary | ICD-10-CM

## 2016-08-22 MED ORDER — PRAVASTATIN SODIUM 40 MG PO TABS: 40.0000 mg | ORAL_TABLET | Freq: Every day | ORAL | 2 refills | Status: DC

## 2016-08-23 ENCOUNTER — Encounter: Payer: Self-pay | Admitting: Family

## 2016-08-23 ENCOUNTER — Ambulatory Visit (INDEPENDENT_AMBULATORY_CARE_PROVIDER_SITE_OTHER): Payer: 59 | Admitting: Family

## 2016-08-23 VITALS — BP 156/84 | HR 94 | Temp 98.2°F | Resp 16 | Ht 64.75 in | Wt 300.5 lb

## 2016-08-23 DIAGNOSIS — E119 Type 2 diabetes mellitus without complications: Secondary | ICD-10-CM | POA: Diagnosis not present

## 2016-08-23 DIAGNOSIS — D649 Anemia, unspecified: Secondary | ICD-10-CM | POA: Diagnosis not present

## 2016-08-23 DIAGNOSIS — I10 Essential (primary) hypertension: Secondary | ICD-10-CM | POA: Diagnosis not present

## 2016-08-23 DIAGNOSIS — E785 Hyperlipidemia, unspecified: Secondary | ICD-10-CM | POA: Diagnosis not present

## 2016-08-23 DIAGNOSIS — Z0000 Encounter for general adult medical examination without abnormal findings: Secondary | ICD-10-CM

## 2016-08-23 MED ORDER — LISINOPRIL 10 MG PO TABS: 10.0000 mg | ORAL_TABLET | Freq: Every day | ORAL | 1 refills | Status: DC

## 2016-08-23 MED ORDER — METFORMIN HCL 500 MG PO TABS: 500.0000 mg | ORAL_TABLET | Freq: Two times a day (BID) | ORAL | 1 refills | Status: DC

## 2016-08-23 NOTE — Assessment & Plan Note (Addendum)
Will start back on metformin.  declined nutrition consult at this time. We'll follow up in 3 months.

## 2016-08-23 NOTE — Addendum Note (Signed)
Addended by: Penne LashWIGGINS, Tacarra Justo N on: 08/23/2016 01:54 PM   Modules accepted: Orders

## 2016-08-23 NOTE — Assessment & Plan Note (Signed)
Elevated. Pleased to see LE edema resolved with decrease in norvasc. Increase lisinopril, f/u 3 months, sooner if not < 135/85.

## 2016-08-23 NOTE — Assessment & Plan Note (Signed)
Agreed to start cholesterol medication. We'll follow

## 2016-08-23 NOTE — Progress Notes (Signed)
Subjective:    Patient ID: Nicole Mendez, female    DOB: [birth year], 41 y.o.   MRN: [id number]  CC: Nicole Mendez is a 41 y.o. female who presents today for physical exam.    HPI: Leg swelling- 2 weeks ago, decreased since decreasing norvasc. no prior echo. No sob,cp. No h/o chf.  HTN- compliant with medication.  Denies exertional chest pain or pressure, numbness or tingling radiating to left arm or jaw, palpitations, dizziness, frequent headaches, changes in vision, or shortness of breath.   DM- a1c increased; Not taking metformin. Know what she needs to eat.   HLD- hasn't start cholesterol medication  Anemia- Heavy menstrual cycles. No dizziness, syncope.       Colorectal Cancer Screening: no early family history Breast Cancer Screening: Mammogram due; will order with [facility] Cervical Cancer Screening: UTD; follows with [facility] Bone Health screening/DEXA for 65+: No increased fracture risk. Defer screening at this time. Lung Cancer Screening: Doesn't have 30 year pack year history and age > 55 years.  Immunizations       Tetanus - utd        Pneumococcal - done Labs: Screening labs today. Exercise: No regular exercise.  Alcohol use: ocasionally Smoking/tobacco use: Nonsmoker.  Regular dental exams: UTD Wears seat belt: Yes. Skin: no new skin lesions    HISTORY:  Past Medical History:  Diagnosis Date  . Alcohol abuse   . Arthritis   . Diabetes mellitus without complication (HCC) 2000  . Hyperlipidemia   . Hypertension   . Morbid obesity (HCC)   . OSA (obstructive sleep apnea)    Not on CPAP  . Psoriasis   . Tobacco abuse     No past surgical history on file. Family History  Problem Relation Age of Onset  . Alcohol abuse Maternal Grandmother   . Cancer Maternal Grandmother        Ovarian  . Hypertension Maternal Grandmother   . Diabetes Maternal Grandmother   . Hypertension Mother   . Diabetes Mother   . Colon cancer Other 80        ALLERGIES: Patient has no known allergies.  Current Outpatient Prescriptions on File Prior to Visit  Medication Sig Dispense Refill  . acyclovir (ZOVIRAX) 400 MG tablet Take 1 tablet (400 mg total) by mouth 3 (three) times daily. 6 tablet 0  . amLODipine (NORVASC) 5 MG tablet Take 1 tablet (5 mg total) by mouth daily. 90 tablet 3  . Ascorbic Acid (VITAMIN C) 100 MG tablet Take 100 mg by mouth daily.    Marland Kitchen. aspirin 81 MG tablet Take 81 mg by mouth daily.    Marland Kitchen. aspirin-acetaminophen-caffeine (EXCEDRIN MIGRAINE) 250-250-65 MG tablet Take 1 tablet by mouth every 6 (six) hours as needed for headache.    . cyclobenzaprine (FLEXERIL) 10 MG tablet Take 1 tablet (10 mg total) by mouth at bedtime as needed for muscle spasms. 20 tablet 0  . ibuprofen (ADVIL,MOTRIN) 600 MG tablet Take 1 tablet (600 mg total) by mouth every 8 (eight) hours as needed. 30 tablet 0  . pravastatin (PRAVACHOL) 40 MG tablet Take 1 tablet (40 mg total) by mouth daily. 90 tablet 2  . ustekinumab (STELARA) 90 MG/ML SOSY injection every 3 (three) months.     No current facility-administered medications on file prior to visit.     Social History  Substance Use Topics  . Smoking status: Former Smoker    Packs/day: 0.50    Types: Cigarettes  .  Smokeless tobacco: Never Used     Comment: stopped 11/2015  . Alcohol use 0.0 oz/week     Comment: occasionally    Review of Systems  Constitutional: Negative for chills, fever and unexpected weight change.  HENT: Negative for congestion.   Respiratory: Negative for cough and shortness of breath.   Cardiovascular: Positive for leg swelling. Negative for chest pain and palpitations.  Gastrointestinal: Negative for nausea and vomiting.  Musculoskeletal: Negative for arthralgias and myalgias.  Skin: Negative for rash.  Neurological: Negative for headaches.  Hematological: Negative for adenopathy.  Psychiatric/Behavioral: Negative for confusion.      Objective:    BP (!) 156/84    Pulse 94   Temp 98.2 F (36.8 C) (Oral)   Resp 16   Ht 5' 4.75" (1.645 m)   Wt (!) 300 lb 8 oz (136.3 kg)   LMP 08/05/2016 (Exact Date)   SpO2 98%   BMI 50.39 kg/m   BP Readings from Last 3 Encounters:  08/23/16 (!) 156/84  08/15/16 (!) 156/94  02/02/16 126/80   Wt Readings from Last 3 Encounters:  08/23/16 (!) 300 lb 8 oz (136.3 kg)  08/15/16 (!) 301 lb 9.6 oz (136.8 kg)  02/02/16 271 lb 9.6 oz (123.2 kg)    Physical Exam  Constitutional: She appears well-developed and well-nourished.  Eyes: Conjunctivae are normal.  Neck: No thyroid mass and no thyromegaly present.  Cardiovascular: Normal rate, regular rhythm, normal heart sounds and normal pulses.   Pulmonary/Chest: Effort normal and breath sounds normal. She has no wheezes. She has no rhonchi. She has no rales.  Lymphadenopathy:       Head (right side): No submental, no submandibular, no tonsillar, no preauricular, no posterior auricular and no occipital adenopathy present.       Head (left side): No submental, no submandibular, no tonsillar, no preauricular, no posterior auricular and no occipital adenopathy present.    She has no cervical adenopathy.  Neurological: She is alert.  Skin: Skin is warm and dry.  Psychiatric: She has a normal mood and affect. Her speech is normal and behavior is normal. Thought content normal.  Vitals reviewed.      Assessment & Plan:   Problem List Items Addressed This Visit      Cardiovascular and Mediastinum   Benign essential HTN    Elevated. Pleased to see LE edema resolved with decrease in norvasc. Increase lisinopril, f/u 3 months, sooner if not < 135/85.       Relevant Medications   lisinopril (PRINIVIL,ZESTRIL) 10 MG tablet     Endocrine   Type 2 diabetes mellitus (HCC)    Will start back on metformin.  declined nutrition consult at this time. We'll follow up in 3 months.      Relevant Medications   lisinopril (PRINIVIL,ZESTRIL) 10 MG tablet   metFORMIN (GLUCOPHAGE)  500 MG tablet     Other   Hyperlipidemia    Agreed to start cholesterol medication. We'll follow      Relevant Medications   lisinopril (PRINIVIL,ZESTRIL) 10 MG tablet   Routine physical examination - Primary    Follows with [facility] for mammogram and Pap smear.declines clinical breast exam or pelvic exam today. Immunizations up-to-date. Screening labs ordered. Encouraged to start walking program      Anemia    Suspect IDA. Pending labs.           I have changed Nicole Mendez's lisinopril. I am also having her maintain her aspirin, vitamin C,  ustekinumab, aspirin-acetaminophen-caffeine, acyclovir, cyclobenzaprine, ibuprofen, amLODipine, pravastatin, and metFORMIN.   Meds ordered this encounter  Medications  . lisinopril (PRINIVIL,ZESTRIL) 10 MG tablet    Sig: Take 1 tablet (10 mg total) by mouth daily.    Dispense:  90 tablet    Refill:  1    Order Specific Question:   Supervising Provider    Answer:   Duncan Dull L [[id number]]  . metFORMIN (GLUCOPHAGE) 500 MG tablet    Sig: Take 1 tablet (500 mg total) by mouth 2 (two) times daily with a meal.    Dispense:  180 tablet    Refill:  1    Order Specific Question:   Supervising Provider    Answer:   Sherlene Shams [[id number]]    Return precautions given.   Risks, benefits, and alternatives of the medications and treatment plan prescribed today were discussed, and patient expressed understanding.   Education regarding symptom management and diagnosis given to patient on AVS.   Continue to follow with Allegra Grana, FNP for routine health maintenance.   Nicole Mare and I agreed with plan.   Rennie Plowman, FNP

## 2016-08-23 NOTE — Patient Instructions (Addendum)
Labs  Increase lisinopril to 10mg  once per day.  If blood pressure not at goal , < 135/85, let me know. Otherwise my follow up in 3 months.   Mammogram, pap [facility].   This is  Dr. Melina Schoolsullo's  example of a  "Low GI"  Diet:  It will allow you to lose 4 to 8  lbs  per month if you follow it carefully.  Your goal with exercise is a minimum of 30 minutes of aerobic exercise 5 days per week (Walking does not count once it becomes easy!)    All of the foods can be found at grocery stores and in bulk at [employer].  The Atkins protein bars and shakes are available in more varieties at Target, WalMart and [employer]'s [employer].     7 AM Breakfast:  Choose from the following:  Low carbohydrate Protein  Shakes (I recommend the  Premier Protein chocolate shakes,  EAS AdvantEdge "Carb Control" shakes  Or the Atkins shakes all are under 3 net carbs)     a scrambled egg/bacon/cheese burrito made with [employer]'s "carb balance" whole wheat tortilla  (about 10 net carbs )  Medical laboratory scientific officerJimmy Deans sells microwaveable frittata (basically a quiche without the pastry crust) that is eaten cold and very convenient way to get your eggs.  8 carbs)  If you make your own protein shakes, avoid bananas and pineapple,  And use low carb greek yogurt or original /unsweetened almond or soy milk    Avoid cereal and bananas, oatmeal and cream of wheat and grits. They are loaded with carbohydrates!   10 AM: high protein snack:  Protein bar by Atkins (the snack size, under 200 cal, usually < 6 net carbs).    A stick of cheese:  Around 1 carb,  100 cal     Dannon Light n Fit [place] Yogurt  (80 cal, 8 carbs)  Other so called "protein bars" and Greek yogurts tend to be loaded with carbohydrates.  Remember, in food advertising, the word "energy" is synonymous for " carbohydrate."  Lunch:   A Sandwich using the bread choices listed, Can use any  Eggs,  lunchmeat, grilled meat or canned tuna), avocado, regular mayo/mustard  and cheese.  A Salad  using blue cheese, ranch,  Goddess or vinagrette,  Avoid taco shells, croutons or "confetti" and no "candied nuts" but regular nuts OK.   No pretzels, nabs  or chips.  Pickles and miniature sweet peppers are a good low carb alternative that provide a "crunch"  The bread is the only source of carbohydrate in a sandwich and  can be decreased by trying some of the attached alternatives to traditional loaf bread   Avoid "Low fat dressings, as well as Reyne DumasCatalina and [place] [employer] dressings They are loaded with sugar!   3 PM/ Mid day  Snack:  Consider  1 ounce of  almonds, walnuts, pistachios, pecans, peanuts,  Macadamia nuts or a nut medley.  Avoid "granola and granola bars "  Mixed nuts are ok in moderation as long as there are no raisins,  cranberries or dried fruit.   KIND bars are OK if you get the low glycemic index variety   Try the prosciutto/mozzarella cheese sticks by Fiorruci  In deli /backery section   High protein      6 PM  Dinner:     Meat/fowl/fish with a green salad, and either broccoli, cauliflower, green beans, spinach, brussel sprouts or  Lima beans. DO NOT BREAD  THE PROTEIN!!      There is a low carb pasta by Dreamfield's that is acceptable and tastes great: only 5 digestible carbs/serving.( All grocery stores but BJs carry it ) Several ready made meals are available low carb:   Try Michel Angelo's chicken piccata or chicken or eggplant parm over low carb pasta.(Lowes and BJs)   Clifton Custard Sanchez's "Carnitas" (pulled pork, no sauce,  0 carbs) or his beef pot roast to make a dinner burrito (at [employer]J's)  Pesto over low carb pasta (bj's sells a good quality pesto in the center refrigerated section of the deli   Try satueeing  Roosvelt Harps with mushroooms as a good side   [employer] Giant makes a mashed cauliflower that tastes like mashed potatoes  Whole wheat pasta is still full of digestible carbs and  Not as low in glycemic index as Dreamfield's.   Brown rice is still rice,  So skip the rice  and noodles if you eat [place] or [place] (or at least limit to 1/2 cup)  9 PM snack :   Breyer's "low carb" fudgsicle or  ice cream bar (Carb Smart line), or  Weight Watcher's ice cream bar , or another "no sugar added" ice cream;  a serving of fresh berries/cherries with whipped cream   Cheese or DANNON'S LlGHT N FIT GREEK YOGURT  8 ounces of [place] unsweetened almond/cococunut milk    Treat yourself to a parfait made with whipped cream blueberiies, walnuts and vanilla greek yogurt  Avoid bananas, pineapple, grapes  and watermelon on a regular basis because they are high in sugar.  THINK OF THEM AS DESSERT  Remember that snack Substitutions should be less than 10 NET carbs per serving and meals < 20 carbs. Remember to subtract fiber grams to get the "net carbs."  @TULLOBREADPACKAGE @

## 2016-08-24 LAB — IRON AND TIBC
Iron Saturation: 8 % — CL (ref 15–55)
Iron: 34 ug/dL (ref 27–159)
TIBC: 416 ug/dL (ref 250–450)
UIBC: 382 ug/dL (ref 131–425)

## 2016-08-24 LAB — FERRITIN: FERRITIN: 14 ng/mL — AB (ref 15–150)

## 2016-08-24 NOTE — Assessment & Plan Note (Signed)
Suspect IDA. Pending labs.

## 2016-08-24 NOTE — Assessment & Plan Note (Signed)
Follows with [facility] for mammogram and Pap smear.declines clinical breast exam or pelvic exam today. Immunizations up-to-date. Screening labs ordered. Encouraged to start walking program

## 2016-09-26 ENCOUNTER — Ambulatory Visit: Payer: 59 | Admitting: Obstetrics & Gynecology

## 2016-09-30 ENCOUNTER — Ambulatory Visit (INDEPENDENT_AMBULATORY_CARE_PROVIDER_SITE_OTHER): Payer: 59 | Admitting: Certified Nurse Midwife

## 2016-09-30 ENCOUNTER — Encounter: Payer: Self-pay | Admitting: Certified Nurse Midwife

## 2016-09-30 VITALS — BP 148/88 | HR 95 | Ht 64.0 in | Wt 304.0 lb

## 2016-09-30 DIAGNOSIS — Z113 Encounter for screening for infections with a predominantly sexual mode of transmission: Secondary | ICD-10-CM | POA: Diagnosis not present

## 2016-09-30 DIAGNOSIS — N9089 Other specified noninflammatory disorders of vulva and perineum: Secondary | ICD-10-CM | POA: Diagnosis not present

## 2016-09-30 DIAGNOSIS — N898 Other specified noninflammatory disorders of vagina: Secondary | ICD-10-CM | POA: Diagnosis not present

## 2016-09-30 MED ORDER — FLUCONAZOLE 150 MG PO TABS: ORAL_TABLET | ORAL | 0 refills | Status: DC

## 2016-09-30 MED ORDER — CLOTRIMAZOLE-BETAMETHASONE 1-0.05 % EX CREA: 1.0000 "application " | TOPICAL_CREAM | Freq: Two times a day (BID) | CUTANEOUS | 0 refills | Status: DC

## 2016-09-30 NOTE — Progress Notes (Signed)
Obstetrics & Gynecology Office Visit   Chief Complaint:  Chief Complaint  Patient presents with  . Vaginitis    Discharge w/odor x few days    History of Present Illness: 41 year old morbidly obese diabetic Black female who presents with complaint of vulvar irritation and vaginal discharge with odor x 3 days. LMP 09/30/2016 (just started spotting today). She has not taken antibiotics recently. No new sexual partners. No new soaps or perineal hygiene products. Used a topicaln cream in vagina and on laia last night Past medical history also significant for hypertension, hyperlipidemia, and psoriasis   Review of Systems:  Review of Systems  Constitutional: Negative for chills and fever.  Genitourinary: Negative for dysuria.       See HPI     Past Medical History:  Past Medical History:  Diagnosis Date  . Alcohol abuse   . Arthritis   . Diabetes mellitus without complication (HCC) 2000  . Hyperlipidemia   . Hypertension   . Morbid obesity (HCC)   . OSA (obstructive sleep apnea)    Not on CPAP  . Psoriasis   . Tobacco abuse     Past Surgical History:  No past surgery    Family History:  Family History  Problem Relation Age of Onset  . Alcohol abuse Maternal Grandmother   . Cancer Maternal Grandmother        Ovarian  . Hypertension Maternal Grandmother   . Diabetes Maternal Grandmother   . Hypertension Mother   . Diabetes Mother   . Colon cancer Other 30    Social History:  Social History   Social History  . Marital status: Single    Spouse name: N/A  . Number of children: 0  . Years of education: N/A   Occupational History  . Not on file.   Social History Main Topics  . Smoking status: Former Smoker    Packs/day: 0.50    Types: Cigarettes  . Smokeless tobacco: Never Used     Comment: stopped 11/2015  . Alcohol use 0.0 oz/week     Comment: occasionally  . Drug use: No  . Sexual activity: Yes    Partners: Male    Birth control/ protection: None     Comment: 1 partner   Other Topics Concern  . 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    Associates Degree   No children    No caffeine    Allergies:  No Known Allergies  Medications: Prior to Admission medications   Medication Sig Start Date End Date Taking? Authorizing Provider  acyclovir (ZOVIRAX) 400 MG tablet Take 1 tablet (400 mg total) by mouth 3 (three) times daily. 08/25/15   Albertine Grates, MD  amLODipine (NORVASC) 5 MG tablet Take 1 tablet (5 mg total) by mouth daily. 08/15/16   Allegra Grana, FNP  Ascorbic Acid (VITAMIN C) 100 MG tablet Take 100 mg by mouth daily.    [provider]  aspirin 81 MG tablet Take 81 mg by mouth daily.    [provider]  aspirin-acetaminophen-caffeine (EXCEDRIN MIGRAINE) 650 052 6825 MG tablet Take 1 tablet by mouth every 6 (six) hours as needed for headache.    [provider]  clotrimazole-betamethasone (LOTRISONE) cream Apply 1 application topically 2 (two) times daily. 09/30/16   Farrel Conners, CNM  cyclobenzaprine (FLEXERIL) 10 MG tablet Take 1 tablet (10 mg total) by mouth  at bedtime as needed for muscle spasms. 02/02/16   Allegra GranaArnett, Margaret G, FNP  fluconazole (DIFLUCAN) 150 MG tablet Take one tablet today and repeat one tab in 3 days 09/30/16   Farrel ConnersGutierrez, Vimal Derego, CNM  ibuprofen (ADVIL,MOTRIN) 600 MG tablet Take 1 tablet (600 mg total) by mouth every 8 (eight) hours as needed. 02/05/16   Allegra GranaArnett, Margaret G, FNP  lisinopril (PRINIVIL,ZESTRIL) 10 MG tablet Take 1 tablet (10 mg total) by mouth daily. 08/23/16   Allegra GranaArnett, Margaret G, FNP  metFORMIN (GLUCOPHAGE) 500 MG tablet Take 1 tablet (500 mg total) by mouth 2 (two) times daily with a meal. 08/23/16   Allegra GranaArnett, Margaret G, FNP  pravastatin (PRAVACHOL) 40 MG tablet Take 1 tablet (40 mg total) by mouth daily. 08/22/16   Allegra GranaArnett, Margaret G, FNP  ustekinumab (STELARA) 90 MG/ML SOSY injection every 3 (three)  months. 06/03/14   [provider]    Physical Exam Vitals: BP (!) 148/88 (BP Location: Left Arm, Patient Position: Sitting, Cuff Size: Large)   Pulse 95   Ht 5\' 4"  (1.626 m)   Wt (!) 304 lb (137.9 kg)   LMP 09/09/2016 (Exact Date)   BMI 52.18 kg/m   Physical Exam  Constitutional: She is oriented to person, place, and time. She appears well-developed and well-nourished.  Genitourinary:  Genitourinary Comments: Vulva: well demarcated thickened hypopigmentation of most of labia majora and small area on left inner thigh. Vagina: white homogenous discharge Cervix: no lesions   Neurological: She is alert and oriented to person, place, and time.  Psychiatric: She has a normal mood and affect. Her behavior is normal. Thought content normal.   Wet prep negative for hyphae, Trich or clue cellsbegun treatment with an antifungal   Assessment: 41 y.o. 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 Lotrisone cream BID prn Patient also desired STD testing Problem List Items Addressed This Visit    None    Visit Diagnoses    Screening for STD (sexually transmitted disease)    -  Primary   Relevant Orders   RPR (Completed)   HIV antibody (Completed)   GC/Chlamydia Probe Amp     RTO prn.

## 2016-10-01 LAB — RPR: RPR: NONREACTIVE

## 2016-10-01 LAB — HIV ANTIBODY (ROUTINE TESTING W REFLEX): HIV Screen 4th Generation wRfx: NONREACTIVE

## 2016-10-02 ENCOUNTER — Encounter: Payer: Self-pay | Admitting: Certified Nurse Midwife

## 2016-10-02 LAB — GC/CHLAMYDIA PROBE AMP
CHLAMYDIA, DNA PROBE: NEGATIVE
NEISSERIA GONORRHOEAE BY PCR: NEGATIVE

## 2016-10-02 LAB — POCT WET PREP (WET MOUNT): TRICHOMONAS WET PREP HPF POC: ABSENT

## 2016-10-10 ENCOUNTER — Other Ambulatory Visit: Payer: Self-pay | Admitting: Family

## 2016-10-17 ENCOUNTER — Other Ambulatory Visit: Payer: Self-pay

## 2016-10-17 ENCOUNTER — Encounter: Payer: Self-pay | Admitting: Obstetrics & Gynecology

## 2016-10-17 ENCOUNTER — Ambulatory Visit (INDEPENDENT_AMBULATORY_CARE_PROVIDER_SITE_OTHER): Payer: 59 | Admitting: Obstetrics & Gynecology

## 2016-10-17 DIAGNOSIS — I10 Essential (primary) hypertension: Secondary | ICD-10-CM

## 2016-10-17 DIAGNOSIS — E119 Type 2 diabetes mellitus without complications: Secondary | ICD-10-CM | POA: Diagnosis not present

## 2016-10-17 DIAGNOSIS — G4733 Obstructive sleep apnea (adult) (pediatric): Secondary | ICD-10-CM | POA: Diagnosis not present

## 2016-10-17 MED ORDER — ACYCLOVIR 400 MG PO TABS: 400.0000 mg | ORAL_TABLET | Freq: Three times a day (TID) | ORAL | 0 refills | Status: DC

## 2016-10-17 MED ORDER — LIRAGLUTIDE -WEIGHT MANAGEMENT 18 MG/3ML ~~LOC~~ SOPN: 3.0000 mg | PEN_INJECTOR | Freq: Every day | SUBCUTANEOUS | 2 refills | Status: DC

## 2016-10-17 NOTE — Patient Instructions (Addendum)
Liraglutide injection (Weight Management) What is this medicine? LIRAGLUTIDE (LIR a GLOO tide) is used with a reduced calorie diet and exercise to help you lose weight. This medicine may be used for other purposes; ask your health care provider or pharmacist if you have questions. COMMON BRAND NAME(S): Saxenda What should I tell my health care provider before I take this medicine? They need to know if you have any of these conditions: -endocrine tumors (MEN 2) or if someone in your family had these tumors -gallbladder disease -high cholesterol -history of alcohol abuse problem -history of pancreatitis -kidney disease or if you are on dialysis -liver disease -previous swelling of the tongue, face, or lips with difficulty breathing, difficulty swallowing, hoarseness, or tightening of the throat -stomach problems -suicidal thoughts, plans, or attempt; a previous suicide attempt by you or a family member -thyroid cancer or if someone in your family had thyroid cancer -an unusual or allergic reaction to liraglutide, other medicines, foods, dyes, or preservatives -pregnant or trying to get pregnant -breast-feeding How should I use this medicine? This medicine is for injection under the skin of your upper leg, stomach area, or upper arm.Use exactly as directed. Take your medicine at regular intervals. Do not take it more often than directed. It is important that you put your used needles and syringes in a special sharps container. Do not put them in a trash can. If you do not have a sharps container, call your pharmacist or healthcare provider to get one. A special MedGuide will be given to you by the pharmacist with each prescription and refill. Be sure to read this information carefully each time. Talk to your pediatrician regarding the use of this medicine in children. Special care may be needed. Overdosage: If you think you have taken too much of this medicine contact a poison control center  or emergency room at once. NOTE: This medicine is only for you. Do not share this medicine with others. What if I miss a dose? If you miss a dose, take it as soon as you can. If it is almost time for your next dose, take only that dose. Do not take double or extra doses. If you miss your dose for 3 days or more, call your doctor or health care professional to talk about how to restart this medicine. What may interact with this medicine? -insulin and other medicines for diabetes This list may not describe all possible interactions. Give your health care provider a list of all the medicines, herbs, non-prescription drugs, or dietary supplements you use. Also tell them if you smoke, drink alcohol, or use illegal drugs. Some items may interact with your medicine. What should I watch for while using this medicine? Visit your doctor or health care professional for regular checks on your progress. This medicine is intended to be used in addition to a healthy diet and appropriate exercise. The best results are achieved this way. Do not increase or in any way change your dose without consulting your doctor or health care professional. Drink plenty of fluids while taking this medicine. Check with your doctor or health care professional if you get an attack of severe diarrhea, nausea, and vomiting. The loss of too much body fluid can make it dangerous for you to take this medicine. This medicine may affect blood sugar levels. If you have diabetes, check with your doctor or health care professional before you change your diet or the dose of your diabetic medicine. Patients and their families should  watch out for worsening depression or thoughts of suicide. Also watch out for sudden changes in feelings such as feeling anxious, agitated, panicky, irritable, hostile, aggressive, impulsive, severely restless, overly excited and hyperactive, or not being able to sleep. If this happens, especially at the beginning of  treatment or after a change in dose, call your health care professional. What side effects may I notice from receiving this medicine? Side effects that you should report to your doctor or health care professional as soon as possible: -allergic reactions like skin rash, itching or hives, swelling of the face, lips, or tongue -breathing problems -diarrhea that continues or is severe -lump or swelling on the neck -severe nausea -signs and symptoms of infection like fever or chills; cough; sore throat; pain or trouble passing urine -signs and symptoms of low blood sugar such as feeling anxious, confusion, dizziness, increased hunger, unusually weak or tired, sweating, shakiness, cold, irritable, headache, blurred vision, fast heartbeat, loss of consciousness -signs and symptoms of kidney injury like trouble passing urine or change in the amount of urine -trouble swallowing -unusual stomach upset or pain -vomiting Side effects that usually do not require medical attention (report to your doctor or health care professional if they continue or are bothersome): -constipation -decreased appetite -diarrhea -fatigue -headache -nausea -pain, redness, or irritation at site where injected -stomach upset -stuffy or runny nose This list may not describe all possible side effects. Call your doctor for medical advice about side effects. You may report side effects to FDA at 1-800-FDA-1088. Where should I keep my medicine? Keep out of the reach of children. Store unopened pen in a refrigerator between 2 and 8 degrees C (36 and 46 degrees F). Do not freeze or use if the medicine has been frozen. Protect from light and excessive heat. After you first use the pen, it can be stored at room temperature between 15 and 30 degrees C (59 and 86 degrees F) or in a refrigerator. Throw away your used pen after 30 days or after the expiration date, whichever comes first. Do not store your pen with the needle attached.  If the needle is left on, medicine may leak from the pen. NOTE: This sheet is a summary. It may not cover all possible information. If you have questions about this medicine, talk to your doctor, pharmacist, or health care provider.  2018 Elsevier/Gold Standard (2016-04-07 14:41:37)

## 2016-10-17 NOTE — Telephone Encounter (Signed)
Medication has been refilled.

## 2016-10-17 NOTE — Progress Notes (Signed)
  HPI:  Patient is a 41 y.o. G0P0000 presenting for evaluation of abnormal weight gain.  The patient has gained 18 pounds over the past 6 mos, and now her BMI is >50.  Had HTN concerns last visit and has seen PCP to start therapy for that...  She feels she has gained weight due to problems with her nutrition and physical activity.  She has these associated symptoms: none.  She has tried nutritionist consultation, prescription appetite suppressants: Phentermine, self-directed dieting and very low calorie diet in the past with some success. PMHx: She  has a past medical history of Alcohol abuse; Arthritis; Diabetes mellitus without complication (HCC) (2000); Hyperlipidemia; Hypertension; Morbid obesity (HCC); OSA (obstructive sleep apnea); Psoriasis; and Tobacco abuse. Also,  has no past surgical history on file., family history includes Alcohol abuse in her maternal grandmother; Cancer in her maternal grandmother; Colon cancer (age of onset: 7180) in her other; Diabetes in her maternal grandmother and mother; Hypertension in her maternal grandmother and mother.,  reports that she has quit smoking. Her smoking use included Cigarettes. She smoked 0.50 packs per day. She has never used smokeless tobacco. She reports that she drinks alcohol. She reports that she does not use drugs.  She has a current medication list which includes the following prescription(s): acyclovir, amlodipine, vitamin c, aspirin, aspirin-acetaminophen-caffeine, clotrimazole-betamethasone, cyclobenzaprine, fluconazole, ibuprofen, liraglutide -weight management, lisinopril, metformin, pravastatin, and ustekinumab. Also, has No Known Allergies.  Review of Systems  Constitutional: Negative for chills, fever and malaise/fatigue.  HENT: Negative for congestion, sinus pain and sore throat.   Eyes: Negative for blurred vision and pain.  Respiratory: Negative for cough and wheezing.   Cardiovascular: Negative for chest pain and leg swelling.    Gastrointestinal: Negative for abdominal pain, constipation, diarrhea, heartburn, nausea and vomiting.  Genitourinary: Negative for dysuria, frequency, hematuria and urgency.  Musculoskeletal: Negative for back pain, joint pain, myalgias and neck pain.  Skin: Negative for itching and rash.  Neurological: Negative for dizziness, tremors and weakness.  Endo/Heme/Allergies: Does not bruise/bleed easily.  Psychiatric/Behavioral: Negative for depression. The patient is not nervous/anxious and does not have insomnia.     Objective: BP 138/90   Ht 5\' 4"  (1.626 m)   Wt 300 lb (136.1 kg)   BMI 51.49 kg/m  Physical Exam  Constitutional: She is oriented to person, place, and time. She appears well-developed and well-nourished. No distress.  Musculoskeletal: Normal range of motion.  Neurological: She is alert and oriented to person, place, and time.  Skin: Skin is warm and dry.  Psychiatric: She has a normal mood and affect.  Vitals reviewed.   ASSESSMENT:  morbid obesity  Plan: Will assist patient in incorporating positive experiences into her life to promote a positive mental attitude.  Education given regarding appropriate lifestyle changes for weight loss, including regular physical activity, healthy coping strategies, caloric restriction, and healthy eating patterns.  Patient is started on prescription appetite suppressants: SAXENDA and self-directed dieting.    The risks and benefits as well as side effects of medication is discussed.  The pros and cons of suppressing appetite and boosting metabolism is counseled.  Risks of tolerance and addiction discussed.  Use of medicine will be short term.  Pt to call with any negative side effects and agrees to keep follow up appointments.  Annamarie MajorPaul Tyechia Allmendinger, MD, Merlinda FrederickFACOG [facility], [facility] 10/17/2016  2:57 PM

## 2016-10-19 ENCOUNTER — Ambulatory Visit (INDEPENDENT_AMBULATORY_CARE_PROVIDER_SITE_OTHER): Payer: 59 | Admitting: Family

## 2016-10-19 ENCOUNTER — Encounter: Payer: Self-pay | Admitting: Family

## 2016-10-19 ENCOUNTER — Ambulatory Visit (INDEPENDENT_AMBULATORY_CARE_PROVIDER_SITE_OTHER): Payer: 59

## 2016-10-19 VITALS — BP 145/80 | HR 90 | Temp 98.3°F | Ht 64.0 in | Wt 304.2 lb

## 2016-10-19 DIAGNOSIS — I10 Essential (primary) hypertension: Secondary | ICD-10-CM

## 2016-10-19 DIAGNOSIS — G4733 Obstructive sleep apnea (adult) (pediatric): Secondary | ICD-10-CM

## 2016-10-19 DIAGNOSIS — M5412 Radiculopathy, cervical region: Secondary | ICD-10-CM

## 2016-10-19 MED ORDER — GABAPENTIN 100 MG PO CAPS: 300.0000 mg | ORAL_CAPSULE | Freq: Every day | ORAL | 3 refills | Status: DC

## 2016-10-19 MED ORDER — LIDOCAINE 5 % EX PTCH: 1.0000 | MEDICATED_PATCH | CUTANEOUS | 1 refills | Status: DC

## 2016-10-19 MED ORDER — PREDNISONE 10 MG PO TABS: ORAL_TABLET | ORAL | 0 refills | Status: DC

## 2016-10-19 NOTE — Progress Notes (Signed)
Pre visit review using our clinic review tool, if applicable. No additional management support is needed unless otherwise documented below in the visit note. 

## 2016-10-19 NOTE — Assessment & Plan Note (Signed)
Chronic. Radicular symptoms support nerve impingement. Suspect exacerbation due to line of work.  Pending xrays, PT, trial of gabapentin. Follow up 1-2 months.

## 2016-10-19 NOTE — Assessment & Plan Note (Signed)
Elevated. Came off third shift. Improved while in room. Will take medication when gets home. Will follow

## 2016-10-19 NOTE — Assessment & Plan Note (Signed)
Reordered equipment since patient does not have. Discussed risks of untreated osa.

## 2016-10-19 NOTE — Patient Instructions (Signed)
Let's treat conservatively as we discussed.   Trial of gabapentin 300mg  at bedtime  PT  Xray today  If conservative treatment doesn't yield results, we will  consult to Sports Medicine/Orthopedics for further evaluation, and imaging.   If there is no improvement in your symptoms, or if there is any worsening of symptoms, or if you have any additional concerns, please return for re-evaluation; or, if we are closed, consider going to the Emergency Room for evaluation if symptoms urgent.   Cervical Radiculopathy Cervical radiculopathy happens when a nerve in the neck (cervical nerve) is pinched or bruised. This condition can develop because of an injury or as part of the normal aging process. Pressure on the cervical nerves can cause pain or numbness that runs from the neck all the way down into the arm and fingers. Usually, this condition gets better with rest. Treatment may be needed if the condition does not improve. What are the causes? This condition may be caused by:  Injury.  Slipped (herniated) disk.  Muscle tightness in the neck because of overuse.  Arthritis.  Breakdown or degeneration in the bones and joints of the spine (spondylosis) due to aging.  Bone spurs that may develop near the cervical nerves.  What are the signs or symptoms? Symptoms of this condition include:  Pain that runs from the neck to the arm and hand. The pain can be severe or irritating. It may be worse when the neck is moved.  Numbness or weakness in the affected arm and hand.  How is this diagnosed? This condition may be diagnosed based on symptoms, medical history, and a physical exam. You may also have tests, including:  X-rays.  CT scan.  MRI.  Electromyogram (EMG).  Nerve conduction tests.  How is this treated? In many cases, treatment is not needed for this condition. With rest, the condition usually gets better over time. If treatment is needed, options may include:  Wearing a  soft neck collar for short periods of time.  Physical therapy to strengthen your neck muscles.  Medicines, such as NSAIDs, oral corticosteroids, or spinal injections.  Surgery. This may be needed if other treatments do not help. Various types of surgery may be done depending on the cause of your problems.  Follow these instructions at home: Managing pain  Take over-the-counter and prescription medicines only as told by your health care provider.  If directed, apply ice to the affected area. ? Put ice in a plastic bag. ? Place a towel between your skin and the bag. ? Leave the ice on for 20 minutes, 2-3 times per day.  If ice does not help, you can try using heat. Take a warm shower or warm bath, or use a heat pack as told by your health care provider.  Try a gentle neck and shoulder massage to help relieve symptoms. Activity  Rest as needed. Follow instructions from your health care provider about any restrictions on activities.  Do stretching and strengthening exercises as told by your health care provider or physical therapist. General instructions  If you were given a soft collar, wear it as told by your health care provider.  Use a flat pillow when you sleep.  Keep all follow-up visits as told by your health care provider. This is important. Contact a health care provider if:  Your condition does not improve with treatment. Get help right away if:  Your pain gets much worse and cannot be controlled with medicines.  You have weakness  or numbness in your hand, arm, face, or leg.  You have a high fever.  You have a stiff, rigid neck.  You lose control of your bowels or your bladder (have incontinence).  You have trouble with walking, balance, or speaking. This information is not intended to replace advice given to you by your health care provider. Make sure you discuss any questions you have with your health care provider. Document Released: 12/14/2000 Document  Revised: 08/27/2015 Document Reviewed: 05/15/2014 Elsevier Interactive Patient Education  [employer]2018 [employer]vier [employer]

## 2016-10-19 NOTE — Progress Notes (Signed)
Subjective:    Patient ID: Nicole Mendez, female    DOB: 30-May-1975, 41 y.o.   MRN: [id number]  CC: Nicole Mendez is a 41 y.o. female who presents today for an acute visit.    HPI: Chief complaint chronic neck and right shoulder pain for years, worsening recently.  Endorses left arm numbness from neck to left shoulder, intermittent for a couple of years. Can 'rub' pain out.  On good days, not sure if doing anything differently. Tried aleve, ibuprofen, arthritis rubs with some relief. Believes tried prednisone , flexeril in the past with temporary relief. Drowsy on flexeril and would prefer not to take.  Had tried gabapentin however doesn't recall if worked.    No weakness, injury.   Works as Programme researcher, broadcasting/film/videoCNA and lab tech. Some aggrevation with moving patient.    HTN- hasn't taken her blood pressure medication yet this morning. Denies exertional chest pain or pressure, numbness or tingling radiating to left arm or jaw, palpitations, dizziness, frequent headaches, changes in vision, or shortness of breath.    OSA- needs equipment. Endorses fatigue        HISTORY:  Past Medical History:  Diagnosis Date  . Alcohol abuse   . Arthritis   . Diabetes mellitus without complication (HCC) 2000  . Hyperlipidemia   . Hypertension   . Morbid obesity (HCC)   . OSA (obstructive sleep apnea)    Not on CPAP  . Psoriasis   . Tobacco abuse    No past surgical history on file. Family History  Problem Relation Age of Onset  . Alcohol abuse Maternal Grandmother   . Cancer Maternal Grandmother        Ovarian  . Hypertension Maternal Grandmother   . Diabetes Maternal Grandmother   . Hypertension Mother   . Diabetes Mother   . Colon cancer Other 80    Allergies: Patient has no known allergies. Current Outpatient Prescriptions on File Prior to Visit  Medication Sig Dispense Refill  . acyclovir (ZOVIRAX) 400 MG tablet Take 1 tablet (400 mg total) by mouth 3 (three) times daily. 6  tablet 0  . amLODipine (NORVASC) 5 MG tablet Take 1 tablet (5 mg total) by mouth daily. 90 tablet 3  . Ascorbic Acid (VITAMIN C) 100 MG tablet Take 100 mg by mouth daily.    Marland Kitchen. aspirin 81 MG tablet Take 81 mg by mouth daily.    Marland Kitchen. aspirin-acetaminophen-caffeine (EXCEDRIN MIGRAINE) 250-250-65 MG tablet Take 1 tablet by mouth every 6 (six) hours as needed for headache.    . clotrimazole-betamethasone (LOTRISONE) cream Apply 1 application topically 2 (two) times daily. 30 g 0  . fluconazole (DIFLUCAN) 150 MG tablet Take one tablet today and repeat one tab in 3 days 2 tablet 0  . ibuprofen (ADVIL,MOTRIN) 600 MG tablet Take 1 tablet (600 mg total) by mouth every 8 (eight) hours as needed. 30 tablet 0  . Liraglutide -Weight Management (SAXENDA) 18 MG/3ML SOPN Inject 3 mg into the skin daily. 5 pen 2  . lisinopril (PRINIVIL,ZESTRIL) 10 MG tablet Take 1 tablet (10 mg total) by mouth daily. 90 tablet 1  . metFORMIN (GLUCOPHAGE) 500 MG tablet Take 1 tablet (500 mg total) by mouth 2 (two) times daily with a meal. 180 tablet 1  . pravastatin (PRAVACHOL) 40 MG tablet Take 1 tablet (40 mg total) by mouth daily. 90 tablet 2  . ustekinumab (STELARA) 90 MG/ML SOSY injection every 3 (three) months.     No current facility-administered medications  on file prior to visit.     Social History  Substance Use Topics  . Smoking status: Former Smoker    Packs/day: 0.50    Types: Cigarettes  . Smokeless tobacco: Never Used     Comment: stopped 11/2015  . Alcohol use 0.0 oz/week     Comment: occasionally    Review of Systems  Constitutional: Negative for chills and fever.  Respiratory: Negative for cough.   Cardiovascular: Negative for chest pain and palpitations.  Gastrointestinal: Negative for nausea and vomiting.  Musculoskeletal: Positive for neck pain.  Neurological: Positive for numbness.      Objective:    BP (!) 145/80   Pulse 90   Temp 98.3 F (36.8 C) (Oral)   Ht 5\' 4"  (1.626 m)   Wt (!) 304  lb 3.2 oz (138 kg)   SpO2 97%   BMI 52.22 kg/m    Physical Exam  Constitutional: She appears well-developed and well-nourished.  Eyes: Conjunctivae are normal.  Neck: Normal range of motion. Neck supple. Spinous process tenderness and muscular tenderness present. No neck rigidity. No edema, no erythema and normal range of motion present.  Negative spurling's.    Cardiovascular: Normal rate, regular rhythm, normal heart sounds and normal pulses.   Pulmonary/Chest: Effort normal and breath sounds normal. She has no wheezes. She has no rhonchi. She has no rales.  Musculoskeletal:       Left shoulder: She exhibits normal range of motion, no tenderness, no bony tenderness and no effusion.  Left Shoulder:   No asymmetry of shoulders when comparing right and left.No pain with palpation over glenohumeral joint lines, [place] joint, AC joint, or bicipital groove. No pain with internal and external rotation. No pain with resisted lateral extension .   Negative active painful arc sign. Negative passive arc ( Neer's). Negative drop arm.   Strength and sensation normal BUE's.   Neurological: She is alert.  Skin: Skin is warm and dry.  Psychiatric: She has a normal mood and affect. Her speech is normal and behavior is normal. Thought content normal.  Vitals reviewed.      Assessment & Plan:   Problem List Items Addressed This Visit      Cardiovascular and Mediastinum   Benign essential HTN    Elevated. Came off third shift. Improved while in room. Will take medication when gets home. Will follow        Respiratory   Obstructive apnea   Relevant Orders   Ambulatory referral to Sleep Studies   Ambulatory referral to Physical Therapy     Nervous and Auditory   Cervical radiculopathy at C6 - Primary    Chronic. Radicular symptoms support nerve impingement. Suspect exacerbation due to line of work.  Pending xrays, PT, trial of gabapentin. Follow up 1-2 months.       Relevant Medications     predniSONE (DELTASONE) 10 MG tablet   lidocaine (LIDODERM) 5 %   gabapentin (NEURONTIN) 100 MG capsule   Other Relevant Orders   DG Cervical Spine Complete        I have discontinued Ms. Corallo's cyclobenzaprine. I am also having her start on predniSONE, lidocaine, and gabapentin. Additionally, I am having her maintain her aspirin, vitamin C, ustekinumab, aspirin-acetaminophen-caffeine, ibuprofen, amLODipine, pravastatin, lisinopril, metFORMIN, fluconazole, clotrimazole-betamethasone, acyclovir, and Liraglutide -Weight Management.   Meds ordered this encounter  Medications  . predniSONE (DELTASONE) 10 MG tablet    Sig: Take 40 mg by mouth on day 1, then taper 10 mg  daily until gone    Dispense:  10 tablet    Refill:  0    Order Specific Question:   Supervising Provider    Answer:   Duncan Dull L [[id number]]  . lidocaine (LIDODERM) 5 %    Sig: Place 1 patch onto the skin daily. Remove & Discard patch within 12 hours.    Dispense:  30 patch    Refill:  1    Order Specific Question:   Supervising Provider    Answer:   Duncan Dull L [[id number]]  . gabapentin (NEURONTIN) 100 MG capsule    Sig: Take 3 capsules (300 mg total) by mouth at bedtime.    Dispense:  90 capsule    Refill:  3    Order Specific Question:   Supervising Provider    Answer:   Sherlene Shams [[id number]]    Return precautions given.   Risks, benefits, and alternatives of the medications and treatment plan prescribed today were discussed, and patient expressed understanding.   Education regarding symptom management and diagnosis given to patient on AVS.  Continue to follow with Allegra Grana, FNP for routine health maintenance.   Nicole Mare and I agreed with plan.   Rennie Plowman, FNP

## 2016-10-24 ENCOUNTER — Other Ambulatory Visit: Payer: Self-pay | Admitting: Family

## 2016-10-24 DIAGNOSIS — M542 Cervicalgia: Principal | ICD-10-CM

## 2016-10-24 DIAGNOSIS — G8929 Other chronic pain: Secondary | ICD-10-CM

## 2016-11-08 ENCOUNTER — Ambulatory Visit: Payer: 59 | Admitting: Physical Therapy

## 2016-11-10 ENCOUNTER — Ambulatory Visit: Payer: 59 | Admitting: Physical Therapy

## 2016-11-14 ENCOUNTER — Ambulatory Visit: Payer: 59 | Admitting: Obstetrics & Gynecology

## 2016-11-15 ENCOUNTER — Encounter: Payer: 59 | Admitting: Physical Therapy

## 2016-11-17 ENCOUNTER — Encounter: Payer: 59 | Admitting: Physical Therapy

## 2016-11-18 ENCOUNTER — Other Ambulatory Visit: Payer: Self-pay | Admitting: Obstetrics and Gynecology

## 2016-11-18 ENCOUNTER — Other Ambulatory Visit: Payer: Self-pay | Admitting: Nurse Practitioner

## 2016-11-21 ENCOUNTER — Other Ambulatory Visit: Payer: Self-pay

## 2016-11-21 DIAGNOSIS — I10 Essential (primary) hypertension: Secondary | ICD-10-CM

## 2016-11-21 DIAGNOSIS — E785 Hyperlipidemia, unspecified: Secondary | ICD-10-CM

## 2016-11-21 MED ORDER — PRAVASTATIN SODIUM 40 MG PO TABS: 40.0000 mg | ORAL_TABLET | Freq: Every day | ORAL | 1 refills | Status: DC

## 2016-11-21 MED ORDER — AMLODIPINE BESYLATE 5 MG PO TABS: 5.0000 mg | ORAL_TABLET | Freq: Every day | ORAL | 1 refills | Status: DC

## 2016-11-22 ENCOUNTER — Ambulatory Visit: Payer: 59 | Admitting: Family

## 2016-12-06 ENCOUNTER — Telehealth: Payer: Self-pay

## 2016-12-06 NOTE — Telephone Encounter (Signed)
Pt rx'd weight loss medication, needs rx for needles so ins will pay for them  Otherwise, they are $50-$60.  [phone]

## 2016-12-07 ENCOUNTER — Other Ambulatory Visit: Payer: Self-pay | Admitting: Obstetrics & Gynecology

## 2016-12-07 MED ORDER — LIRAGLUTIDE -WEIGHT MANAGEMENT 18 MG/3ML ~~LOC~~ SOPN: 3.0000 mg | PEN_INJECTOR | Freq: Every day | SUBCUTANEOUS | 1 refills | Status: DC

## 2016-12-07 NOTE — Telephone Encounter (Signed)
Rx done, unclear as to size of needle to Rx even after reviewing everything we have on Saxenda so will provide Rx for pharmacist to hopefully assist.  Could not eRx this type of Rx so fax or pick up. Thx.

## 2016-12-07 NOTE — Telephone Encounter (Signed)
The Saxenda cost 250 dollars for a 30 day rx she wants to see if you can write it for 60 days?  So she doesn't have to pay as much,

## 2016-12-09 ENCOUNTER — Telehealth: Payer: Self-pay | Admitting: *Deleted

## 2016-12-09 DIAGNOSIS — M25511 Pain in right shoulder: Principal | ICD-10-CM

## 2016-12-09 DIAGNOSIS — G8929 Other chronic pain: Secondary | ICD-10-CM

## 2016-12-09 NOTE — Telephone Encounter (Signed)
Ref placed to orthopedics if she cannot see pt Otherwise she can continue medication therapy which I prescribed at time of visit

## 2016-12-09 NOTE — Telephone Encounter (Signed)
Is there a plan that you would suggest I tell the patient.

## 2016-12-09 NOTE — Telephone Encounter (Signed)
Pt stated that she has shoulder pain, she was referred to physical therapy . Pt could not afford physical therapy services.  She requested a call to discuss further care  Pt contact [phone]

## 2016-12-09 NOTE — Telephone Encounter (Signed)
Pt called back returning your call. Thank you! °

## 2016-12-09 NOTE — Telephone Encounter (Signed)
Unable to leave VM due to VM being too full.

## 2016-12-12 NOTE — Telephone Encounter (Signed)
Patient has been informed.  She had no questions, comments, or concerns. 

## 2016-12-15 ENCOUNTER — Ambulatory Visit: Payer: 59 | Admitting: Obstetrics & Gynecology

## 2016-12-16 ENCOUNTER — Telehealth: Payer: Self-pay | Admitting: Family

## 2016-12-16 NOTE — Telephone Encounter (Signed)
Tried to reach patient by Phone for [facility] no answer and no voicemail will continue to call.

## 2016-12-22 ENCOUNTER — Ambulatory Visit: Payer: 59 | Attending: Otolaryngology

## 2016-12-22 DIAGNOSIS — G4733 Obstructive sleep apnea (adult) (pediatric): Secondary | ICD-10-CM | POA: Diagnosis not present

## 2016-12-27 ENCOUNTER — Encounter: Payer: Self-pay | Admitting: Family

## 2016-12-27 ENCOUNTER — Ambulatory Visit (INDEPENDENT_AMBULATORY_CARE_PROVIDER_SITE_OTHER): Payer: 59 | Admitting: Family

## 2016-12-27 VITALS — BP 150/88 | HR 101 | Temp 98.4°F | Ht 64.0 in | Wt 312.8 lb

## 2016-12-27 DIAGNOSIS — Z23 Encounter for immunization: Secondary | ICD-10-CM

## 2016-12-27 DIAGNOSIS — E119 Type 2 diabetes mellitus without complications: Secondary | ICD-10-CM | POA: Diagnosis not present

## 2016-12-27 DIAGNOSIS — I10 Essential (primary) hypertension: Secondary | ICD-10-CM

## 2016-12-27 MED ORDER — LISINOPRIL 10 MG PO TABS
10.0000 mg | ORAL_TABLET | Freq: Every day | ORAL | 1 refills | Status: DC
Start: 2016-12-27 — End: 2017-03-20

## 2016-12-27 MED ORDER — LISINOPRIL 10 MG PO TABS: 10.0000 mg | ORAL_TABLET | Freq: Every day | ORAL | 1 refills | Status: DC

## 2016-12-27 NOTE — Patient Instructions (Addendum)
Monitor blood pressure,  Goal is less than 130/80; if persistently higher, please make sooner follow up appointment so we can recheck you blood pressure and manage medications  Start lisinopril TODAY and let me know if we are at goal.   Stop norvasc due to swelling in legs- let me know if doesn't improve   Labs today   Managing Your Hypertension Hypertension is commonly called high blood pressure. This is when the force of your blood pressing against the walls of your arteries is too strong. Arteries are blood vessels that carry blood from your heart throughout your body. Hypertension forces the heart to work harder to pump blood, and may cause the arteries to become narrow or stiff. Having untreated or uncontrolled hypertension can cause heart attack, stroke, kidney disease, and other problems. What are blood pressure readings? A blood pressure reading consists of a higher number over a lower number. Ideally, your blood pressure should be below 120/80. The first ("top") number is called the systolic pressure. It is a measure of the pressure in your arteries as your heart beats. The second ("bottom") number is called the diastolic pressure. It is a measure of the pressure in your arteries as the heart relaxes. What does my blood pressure reading mean? Blood pressure is classified into four stages. Based on your blood pressure reading, your health care provider may use the following stages to determine what type of treatment you need, if any. Systolic pressure and diastolic pressure are measured in a unit called mm Hg. Normal  Systolic pressure: below 120.  Diastolic pressure: below 80. Elevated  Systolic pressure: 120-129.  Diastolic pressure: below 80. Hypertension stage 1  Systolic pressure: 130-139.  Diastolic pressure: 80-89. Hypertension stage 2  Systolic pressure: 140 or above.  Diastolic pressure: 90 or above. What health risks are associated with hypertension? Managing  your hypertension is an important responsibility. Uncontrolled hypertension can lead to:  A heart attack.  A stroke.  A weakened blood vessel (aneurysm).  Heart failure.  Kidney damage.  Eye damage.  Metabolic syndrome.  Memory and concentration problems.  What changes can I make to manage my hypertension? Hypertension can be managed by making lifestyle changes and possibly by taking medicines. Your health care provider will help you make a plan to bring your blood pressure within a normal range. Eating and drinking  Eat a diet that is high in fiber and potassium, and low in salt (sodium), added sugar, and fat. An example eating plan is called the DASH (Dietary Approaches to Stop Hypertension) diet. To eat this way: ? Eat plenty of fresh fruits and vegetables. Try to fill half of your plate at each meal with fruits and vegetables. ? Eat whole grains, such as whole wheat pasta, brown rice, or whole grain bread. Fill about one quarter of your plate with whole grains. ? Eat low-fat diary products. ? Avoid fatty cuts of meat, processed or cured meats, and poultry with skin. Fill about one quarter of your plate with lean proteins such as fish, chicken without skin, beans, eggs, and tofu. ? Avoid premade and processed foods. These tend to be higher in sodium, added sugar, and fat.  Reduce your daily sodium intake. Most people with hypertension should eat less than 1,500 mg of sodium a day.  Limit alcohol intake to no more than 1 drink a day for nonpregnant women and 2 drinks a day for men. One drink equals 12 oz of beer, 5 oz of wine, or 1  oz of hard liquor. Lifestyle  Work with your health care provider to maintain a healthy body weight, or to lose weight. Ask what an ideal weight is for you.  Get at least 30 minutes of exercise that causes your heart to beat faster (aerobic exercise) most days of the week. Activities may include walking, swimming, or biking.  Include exercise to  strengthen your muscles (resistance exercise), such as weight lifting, as part of your weekly exercise routine. Try to do these types of exercises for 30 minutes at least 3 days a week.  Do not use any products that contain nicotine or tobacco, such as cigarettes and e-cigarettes. If you need help quitting, ask your health care provider.  Control any long-term (chronic) conditions you have, such as high cholesterol or diabetes. Monitoring  Monitor your blood pressure at home as told by your health care provider. Your personal target blood pressure may vary depending on your medical conditions, your age, and other factors.  Have your blood pressure checked regularly, as often as told by your health care provider. Working with your health care provider  Review all the medicines you take with your health care provider because there may be side effects or interactions.  Talk with your health care provider about your diet, exercise habits, and other lifestyle factors that may be contributing to hypertension.  Visit your health care provider regularly. Your health care provider can help you create and adjust your plan for managing hypertension. Will I need medicine to control my blood pressure? Your health care provider may prescribe medicine if lifestyle changes are not enough to get your blood pressure under control, and if:  Your systolic blood pressure is 130 or higher.  Your diastolic blood pressure is 80 or higher.  Take medicines only as told by your health care provider. Follow the directions carefully. Blood pressure medicines must be taken as prescribed. The medicine does not work as well when you skip doses. Skipping doses also puts you at risk for problems. Contact a health care provider if:  You think you are having a reaction to medicines you have taken.  You have repeated (recurrent) headaches.  You feel dizzy.  You have swelling in your ankles.  You have trouble with your  vision. Get help right away if:  You develop a severe headache or confusion.  You have unusual weakness or numbness, or you feel faint.  You have severe pain in your chest or abdomen.  You vomit repeatedly.  You have trouble breathing. Summary  Hypertension is when the force of blood pumping through your arteries is too strong. If this condition is not controlled, it may put you at risk for serious complications.  Your personal target blood pressure may vary depending on your medical conditions, your age, and other factors. For most people, a normal blood pressure is less than 120/80.  Hypertension is managed by lifestyle changes, medicines, or both. Lifestyle changes include weight loss, eating a healthy, low-sodium diet, exercising more, and limiting alcohol. This information is not intended to replace advice given to you by your health care provider. Make sure you discuss any questions you have with your health care provider. Document Released: 12/14/2011 Document Revised: 02/17/2016 Document Reviewed: 02/17/2016 Elsevier Interactive Patient Education  [employer].

## 2016-12-27 NOTE — Progress Notes (Signed)
Subjective:    Patient ID: Nicole Mendez, female    DOB: 1975/09/15, 40 y.o.   MRN: [id number]  CC: Nicole Mendez is a 41 y.o. female who presents today for follow up.   HPI: HTN- compliant with amlodipine. Never got lisinopril through  Mail order.   Denies exertional chest pain or pressure, numbness or tingling radiating to left arm or jaw, palpitations, dizziness, frequent headaches, changes in vision, or shortness of breath.    DM - on metformin. Doesn't check blood sugars.   Complains of swelling in feet, ankles x 2 weeks, comes and goes.  Gets worse at end of day. Elevates helps. Thinks amlodipine is causing it.   No SOB. No recent travel, immobilization.       HISTORY:  Past Medical History:  Diagnosis Date  . Alcohol abuse   . Arthritis   . Diabetes mellitus without complication (HCC) 2000  . Hyperlipidemia   . Hypertension   . Morbid obesity (HCC)   . OSA (obstructive sleep apnea)    Not on CPAP  . Psoriasis   . Tobacco abuse    No past surgical history on file. Family History  Problem Relation Age of Onset  . Alcohol abuse Maternal Grandmother   . Cancer Maternal Grandmother        Ovarian  . Hypertension Maternal Grandmother   . Diabetes Maternal Grandmother   . Hypertension Mother   . Diabetes Mother   . Colon cancer Other 80    Allergies: Patient has no known allergies. Current Outpatient Prescriptions on File Prior to Visit  Medication Sig Dispense Refill  . acyclovir (ZOVIRAX) 400 MG tablet Take 1 tablet (400 mg total) by mouth 3 (three) times daily. 6 tablet 0  . aspirin 81 MG tablet Take 81 mg by mouth daily.    Marland Kitchen aspirin-acetaminophen-caffeine (EXCEDRIN MIGRAINE) 250-250-65 MG tablet Take 1 tablet by mouth every 6 (six) hours as needed for headache.    . clotrimazole-betamethasone (LOTRISONE) cream Apply 1 application topically 2 (two) times daily. 30 g 0  . gabapentin (NEURONTIN) 100 MG capsule Take 3 capsules (300 mg total) by  mouth at bedtime. 90 capsule 3  . ibuprofen (ADVIL,MOTRIN) 600 MG tablet Take 1 tablet (600 mg total) by mouth every 8 (eight) hours as needed. 30 tablet 0  . metFORMIN (GLUCOPHAGE) 500 MG tablet Take 1 tablet (500 mg total) by mouth 2 (two) times daily with a meal. 180 tablet 1  . metFORMIN (GLUCOPHAGE) 500 MG tablet TAKE 1 TABLET BY MOUTH TWO  TIMES DAILY WITH MEALS 180 tablet 1  . pravastatin (PRAVACHOL) 40 MG tablet Take 1 tablet (40 mg total) by mouth daily. 90 tablet 1  . ustekinumab (STELARA) 90 MG/ML SOSY injection every 3 (three) months.    . valACYclovir (VALTREX) 500 MG tablet TAKE 1 TABLET BY MOUTH ONCE DAILY 90 tablet 0   No current facility-administered medications on file prior to visit.     Social History  Substance Use Topics  . Smoking status: Former Smoker    Packs/day: 0.50    Types: Cigarettes  . Smokeless tobacco: Never Used     Comment: stopped 11/2015  . Alcohol use 0.0 oz/week     Comment: occasionally    Review of Systems  Constitutional: Negative for chills and fever.  Respiratory: Negative for cough and shortness of breath.   Cardiovascular: Positive for leg swelling. Negative for chest pain and palpitations.  Gastrointestinal: Negative for nausea and vomiting.  Objective:    BP (!) 150/88   Pulse (!) 101   Temp 98.4 F (36.9 C) (Oral)   Ht  (1.626 m)   Wt (!) 312 lb 12.8 oz (141.9 kg)   SpO2 93%   BMI 53.69 kg/m  BP Readings from Last 3 Encounters:  12/27/16 (!) 150/88  10/19/16 (!) 145/80  10/17/16 138/90   Wt Readings from Last 3 Encounters:  12/27/16 (!) 312 lb 12.8 oz (141.9 kg)  10/19/16 (!) 304 lb 3.2 oz (138 kg)  10/17/16 300 lb (136.1 kg)    Physical Exam  Constitutional: She appears well-developed and well-nourished.  Eyes: Conjunctivae are normal.  Cardiovascular: Normal rate, regular rhythm, normal heart sounds and normal pulses.   Trace BLE edema. No palpable cords or masses. No erythema or increased warmth. No  asymmetry in calf size when compared bilaterally LE hair growth symmetric and present. No discoloration of varicosities noted. LE warm and palpable pedal pulses.   Pulmonary/Chest: Effort normal and breath sounds normal. She has no wheezes. She has no rhonchi. She has no rales.  Neurological: She is alert.  Skin: Skin is warm and dry.  Psychiatric: She has a normal mood and affect. Her speech is normal and behavior is normal. Thought content normal.  Vitals reviewed.      Assessment & Plan:   Problem List Items Addressed This Visit      Cardiovascular and Mediastinum   Benign essential HTN - Primary    Elevated today however patient has not been taking lisinopril. Re-prescribed for her today. We'll stop Norvasc as suspect this contributing to LE swelling. Patient declines any need for a stat ultrasound forswelling of bilateral legs. I think appropriate her Well's criteria for DVT is low risk. Will follow. Patient will keep BP log.       Relevant Medications   lisinopril (PRINIVIL,ZESTRIL) 10 MG tablet   lisinopril (PRINIVIL,ZESTRIL) 10 MG tablet   Other Relevant Orders   Basic Metabolic Panel (BMET) (Completed)     Endocrine   Type 2 diabetes mellitus (HCC)    Compliant with regimen. Pending labs.      Relevant Medications   lisinopril (PRINIVIL,ZESTRIL) 10 MG tablet   lisinopril (PRINIVIL,ZESTRIL) 10 MG tablet   Other Relevant Orders   Hemoglobin A1c (Completed)   Microalbumin / creatinine urine ratio    Other Visit Diagnoses    Need for immunization against influenza       Relevant Orders   Flu Vaccine QUAD 36+ mos IM (Completed)       I have discontinued Ms. Farkas's vitamin C, fluconazole, predniSONE, lidocaine, amLODipine, and Liraglutide -Weight Management. I am also having her maintain her aspirin, ustekinumab, aspirin-acetaminophen-caffeine, ibuprofen, metFORMIN, clotrimazole-betamethasone, acyclovir, gabapentin, metFORMIN, valACYclovir, pravastatin,  lisinopril, and lisinopril.   Meds ordered this encounter  Medications  . lisinopril (PRINIVIL,ZESTRIL) 10 MG tablet    Sig: Take 1 tablet (10 mg total) by mouth daily.    Dispense:  30 tablet    Refill:  1    Order Specific Question:   Supervising Provider    Answer:   Duncan Dull L [[id number]]  . lisinopril (PRINIVIL,ZESTRIL) 10 MG tablet    Sig: Take 1 tablet (10 mg total) by mouth daily.    Dispense:  90 tablet    Refill:  1    Order Specific Question:   Supervising Provider    Answer:   Sherlene Shams [[id number]]    Return precautions given.   Risks, benefits, and  alternatives of the medications and treatment plan prescribed today were discussed, and patient expressed understanding.   Education regarding symptom management and diagnosis given to patient on AVS.  Continue to follow with Allegra Grana, FNP for routine health maintenance.   Nicole Mendez and I agreed with plan.   Rennie Plowman, FNP

## 2016-12-28 LAB — BASIC METABOLIC PANEL
BUN/Creatinine Ratio: 11 (ref 9–23)
BUN: 6 mg/dL (ref 6–24)
CALCIUM: 8.6 mg/dL — AB (ref 8.7–10.2)
CO2: 24 mmol/L (ref 20–29)
CREATININE: 0.54 mg/dL — AB (ref 0.57–1.00)
Chloride: 99 mmol/L (ref 96–106)
GFR calc Af Amer: 135 mL/min/{1.73_m2} (ref >59)
GFR, EST NON AFRICAN AMERICAN: 117 mL/min/{1.73_m2} (ref >59)
Glucose: 108 mg/dL — ABNORMAL HIGH (ref 65–99)
POTASSIUM: 4.1 mmol/L (ref 3.5–5.2)
Sodium: 139 mmol/L (ref 134–144)

## 2016-12-28 LAB — MICROALBUMIN / CREATININE URINE RATIO
CREATININE, UR: 70.4 mg/dL
Microalb/Creat Ratio: 11.4 mg/g creat (ref 0.0–30.0)
Microalbumin, Urine: 8 ug/mL

## 2016-12-28 LAB — HEMOGLOBIN A1C
ESTIMATED AVERAGE GLUCOSE: 137 mg/dL
Hgb A1c MFr Bld: 6.4 % — ABNORMAL HIGH (ref 4.8–5.6)

## 2016-12-28 NOTE — Assessment & Plan Note (Addendum)
Elevated today however patient has not been taking lisinopril. Re-prescribed for her today. We'll stop Norvasc as suspect this contributing to LE swelling. Patient declines any need for a stat ultrasound for swelling of bilateral legs. I think appropriate as her Well's criteria for DVT is low risk. Will follow. Patient will keep BP log.

## 2016-12-28 NOTE — Assessment & Plan Note (Signed)
Compliant with regimen. Pending labs.

## 2017-02-27 ENCOUNTER — Ambulatory Visit: Payer: 59 | Admitting: Internal Medicine

## 2017-03-20 ENCOUNTER — Encounter: Payer: Self-pay | Admitting: Internal Medicine

## 2017-03-20 ENCOUNTER — Ambulatory Visit: Payer: 59 | Admitting: Internal Medicine

## 2017-03-20 VITALS — BP 180/86 | HR 105 | Temp 98.2°F | Wt 316.1 lb

## 2017-03-20 DIAGNOSIS — M25512 Pain in left shoulder: Secondary | ICD-10-CM | POA: Diagnosis not present

## 2017-03-20 DIAGNOSIS — M5412 Radiculopathy, cervical region: Secondary | ICD-10-CM | POA: Insufficient documentation

## 2017-03-20 DIAGNOSIS — I10 Essential (primary) hypertension: Secondary | ICD-10-CM | POA: Diagnosis not present

## 2017-03-20 MED ORDER — CYCLOBENZAPRINE HCL 5 MG PO TABS: 5.0000 mg | ORAL_TABLET | Freq: Every day | ORAL | 1 refills | Status: DC | PRN

## 2017-03-20 MED ORDER — MELOXICAM 15 MG PO TABS: 15.0000 mg | ORAL_TABLET | Freq: Every day | ORAL | 0 refills | Status: DC | PRN

## 2017-03-20 MED ORDER — LISINOPRIL 40 MG PO TABS: 40.0000 mg | ORAL_TABLET | Freq: Every day | ORAL | 0 refills | Status: DC

## 2017-03-20 NOTE — Patient Instructions (Addendum)
Please follow up in 2-3 weeks sooner if needed  We will order MRI neck and Xray left shoulder [facility]  We will increase Lisinoprol 40 mg daily  Take Tylenol max 3000 mg in 1 day Take Mobic as needed for pain    Shoulder Pain Many things can cause shoulder pain, including:  An injury to the area.  Overuse of the shoulder.  Arthritis.  The source of the pain can be:  Inflammation.  An injury to the shoulder joint.  An injury to a tendon, ligament, or bone.  Follow these instructions at home: Take these actions to help with your pain:  Squeeze a soft ball or a foam pad as much as possible. This helps to keep the shoulder from swelling. It also helps to strengthen the arm.  Take over-the-counter and prescription medicines only as told by your health care provider.  If directed, apply ice to the area: ? Put ice in a plastic bag. ? Place a towel between your skin and the bag. ? Leave the ice on for 20 minutes, 2-3 times per day. Stop applying ice if it does not help with the pain.  If you were given a shoulder sling or immobilizer: ? Wear it as told. ? Remove it to shower or bathe. ? Move your arm as little as possible, but keep your hand moving to prevent swelling.  Contact a health care provider if:  Your pain gets worse.  Your pain is not relieved with medicines.  New pain develops in your arm, hand, or fingers. Get help right away if:  Your arm, hand, or fingers: ? Tingle. ? Become numb. ? Become swollen. ? Become painful. ? Turn white or blue. This information is not intended to replace advice given to you by your health care provider. Make sure you discuss any questions you have with your health care provider. Document Released: 12/29/2004 Document Revised: 11/15/2015 Document Reviewed: 07/14/2014 Elsevier Interactive Patient Education  2017 Elsevier Inc.  Cervical Radiculopathy Cervical radiculopathy means that a nerve in the neck is pinched or bruised.  This can cause pain or loss of feeling (numbness) that runs from your neck to your arm and fingers. Follow these instructions at home: Managing pain  Take over-the-counter and prescription medicines only as told by your doctor.  If directed, put ice on the injured or painful area. ? Put ice in a plastic bag. ? Place a towel between your skin and the bag. ? Leave the ice on for 20 minutes, 2-3 times per day.  If ice does not help, you can try using heat. Take a warm shower or warm bath, or use a heat pack as told by your doctor.  You may try a gentle neck and shoulder massage. Activity  Rest as needed. Follow instructions from your doctor about any activities to avoid.  Do exercises as told by your doctor or physical therapist. General instructions  If you were given a soft collar, wear it as told by your doctor.  Use a flat pillow when you sleep.  Keep all follow-up visits as told by your doctor. This is important. Contact a doctor if:  Your condition does not improve with treatment. Get help right away if:  Your pain gets worse and is not controlled with medicine.  You lose feeling or feel weak in your hand, arm, face, or leg.  You have a fever.  You have a stiff neck.  You cannot control when you poop or pee (have incontinence).  You have trouble with walking, balance, or talking. This information is not intended to replace advice given to you by your health care provider. Make sure you discuss any questions you have with your health care provider. Document Released: 03/10/2011 Document Revised: 08/27/2015 Document Reviewed: 05/15/2014 Elsevier Interactive Patient Education  [employer]2018 Elsevier Inc.

## 2017-03-20 NOTE — Progress Notes (Signed)
Chief Complaint  Patient presents with  . Shoulder Pain   C/o neck and left shoulder pain chronic. 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 She is taking Excedrin and Tylenol arthritis 2 pills bid. Pain radiates to left shoulder, left arm with numbness and tingling. She has spasms and at times pain is 10/10. She thinks PT will be expensive and she works 2 jobs in am and pm so schedule is problem.  Pain is worsening x last 2 months.  She reports narcotics and muscle relaxers help but make sure sleepy.    HTN uncontrolled on Lisinopril 10 mg    Review of Systems  Constitutional: Negative for weight loss.  Respiratory: Negative for shortness of breath.   Cardiovascular: Negative for chest pain.  Musculoskeletal: Positive for back pain and joint pain.  Skin: Negative for rash.  Neurological: Positive for tingling and sensory change.   Past Medical History:  Diagnosis Date  . Alcohol abuse   . Arthritis   . Cervical radiculopathy   . Diabetes mellitus without complication (HCC) 2000  . Hyperlipidemia   . Hypertension   . Morbid obesity (HCC)   . OSA (obstructive sleep apnea)    Not on CPAP  . Psoriasis   . Tobacco abuse    No past surgical history on file. Family History  Problem Relation Age of Onset  . Alcohol abuse Maternal Grandmother   . Cancer Maternal Grandmother        Ovarian  . Hypertension Maternal Grandmother   . Diabetes Maternal Grandmother   . Hypertension Mother   . Diabetes Mother   . Colon cancer Other 66   Social History   Socioeconomic History  . Marital status: Single    Spouse name: Not on file  . Number of children: 0  . Years of education: Not on file  . Highest education level: Not on file  Social Needs  . Financial resource strain: Not on file  . Food insecurity - worry: Not on file  . Food insecurity - inability: Not on file  . Transportation needs - medical: Not on file  . Transportation needs - non-medical: Not on  file  Occupational History  . Not on file  Tobacco Use  . Smoking status: Former Smoker    Packs/day: 0.50    Types: Cigarettes  . Smokeless tobacco: Never Used  . Tobacco comment: stopped 11/2015  Substance and Sexual Activity  . Alcohol use: Yes    Alcohol/week: 0.0 oz    Comment: occasionally  . Drug use: No  . Sexual activity: Yes    Partners: Male    Birth control/protection: None    Comment: 1 partner  Other Topics Concern  . 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    Associates Degree   No children    No caffeine   Current Meds  Medication Sig  . acetaminophen (TYLENOL) 650 MG CR tablet Take 650 mg by mouth every 8 (eight) hours as needed for pain.  Marland Kitchen acyclovir (ZOVIRAX) 400 MG tablet Take 1 tablet (400 mg total) by mouth 3 (three) times daily.  Marland Kitchen aspirin 81 MG tablet Take 81 mg by mouth daily.  Marland Kitchen aspirin-acetaminophen-caffeine (EXCEDRIN MIGRAINE) 250-250-65 MG tablet Take 1 tablet by mouth every 6 (six) hours as needed for headache.  . clotrimazole-betamethasone (LOTRISONE) cream Apply 1 application topically 2 (two) times daily.  Marland Kitchen  gabapentin (NEURONTIN) 100 MG capsule Take 3 capsules (300 mg total) by mouth at bedtime.  . metFORMIN (GLUCOPHAGE) 500 MG tablet Take 1 tablet (500 mg total) by mouth 2 (two) times daily with a meal.  . metFORMIN (GLUCOPHAGE) 500 MG tablet TAKE 1 TABLET BY MOUTH TWO  TIMES DAILY WITH MEALS  . pravastatin (PRAVACHOL) 40 MG tablet Take 1 tablet (40 mg total) by mouth daily.  . ustekinumab (STELARA) 90 MG/ML SOSY injection every 3 (three) months.  . valACYclovir (VALTREX) 500 MG tablet TAKE 1 TABLET BY MOUTH ONCE DAILY  . [DISCONTINUED] lisinopril (PRINIVIL,ZESTRIL) 10 MG tablet Take 1 tablet (10 mg total) by mouth daily.  . [DISCONTINUED] lisinopril (PRINIVIL,ZESTRIL) 10 MG tablet Take 1 tablet (10 mg total) by mouth daily.   No Known Allergies Recent Results (from the  past 2160 hour(s))  Basic Metabolic Panel (BMET)     Status: Abnormal   Collection Time: 12/27/16  2:46 PM  Result Value Ref Range   Glucose 108 (H) 65 - 99 mg/dL    Comment: Specimen received in contact with cells. No visible hemolysis present. However GLUC may be decreased and K increased. Clinical correlation indicated.    BUN 6 6 - 24 mg/dL   Creatinine, Ser 0.980.54 (L) 0.57 - 1.00 mg/dL   GFR calc non Af Amer 117 >59 mL/min/1.73   GFR calc Af Amer 135 >59 mL/min/1.73   BUN/Creatinine Ratio 11 9 - 23   Sodium 139 134 - 144 mmol/L   Potassium 4.1 3.5 - 5.2 mmol/L    Comment: Specimen received in contact with cells. No visible hemolysis present. However GLUC may be decreased and K increased. Clinical correlation indicated.    Chloride 99 96 - 106 mmol/L   CO2 24 20 - 29 mmol/L   Calcium 8.6 (L) 8.7 - 10.2 mg/dL  Hemoglobin J1BA1c     Status: Abnormal   Collection Time: 12/27/16  2:46 PM  Result Value Ref Range   Hgb A1c MFr Bld 6.4 (H) 4.8 - 5.6 %    Comment:          Prediabetes: 5.7 - 6.4          Diabetes: >6.4          Glycemic control for adults with diabetes: <7.0    Est. average glucose Bld gHb Est-mCnc 137 mg/dL  Microalbumin / creatinine urine ratio     Status: None   Collection Time: 12/27/16  2:46 PM  Result Value Ref Range   Creatinine, Urine 70.4 Not Estab. mg/dL   Albumin, Urine 8.0 Not Estab. ug/mL   Microalb/Creat Ratio 11.4 0.0 - 30.0 mg/g creat    Comment:                      Normal:                0.0 -  30.0                      Albuminuria:          31.0 - 300.0                      Clinical albuminuria:       >300.0    Objective  Body mass index is 54.26 kg/m. Wt Readings from Last 3 Encounters:  03/20/17 (!) 316 lb 2 oz (143.4 kg)  12/27/16 (!) 312 lb 12.8 oz (141.9 kg)  10/19/16 (!) 304 lb 3.2 oz (138 kg)   Temp Readings from Last 3 Encounters:  03/20/17 98.2 F (36.8 C) (Oral)  12/27/16 98.4 F (36.9 C) (Oral)  10/19/16 98.3 F (36.8 C)  (Oral)   BP Readings from Last 3 Encounters:  03/20/17 (!) 180/86  12/27/16 (!) 150/88  10/19/16 (!) 145/80   Pulse Readings from Last 3 Encounters:  03/20/17 (!) 105  12/27/16 (!) 101  10/19/16 90   O2 95% today room air  Physical Exam  Constitutional: She is oriented to person, place, and time and well-developed, well-nourished, and in no distress.  HENT:  Head: Normocephalic and atraumatic.  Mouth/Throat: Oropharynx is clear and moist and mucous membranes are normal.  Eyes: Conjunctivae are normal. Pupils are equal, round, and reactive to light.  Neck: Spinous process tenderness present.    Pain with ROM neck esp extension and looking left lateral.   Cardiovascular: Regular rhythm and normal heart sounds. Tachycardia present.  No murmur heard. Pulmonary/Chest: Effort normal and breath sounds normal.  Musculoskeletal:       Cervical back: She exhibits decreased range of motion, tenderness, bony tenderness and spasm.       Back:  Neurological: She is alert and oriented to person, place, and time. Gait normal. Gait normal.  Skin: Skin is warm, dry and intact.  Psychiatric: Mood, memory, affect and judgment normal.  Tearful on exam 2/2 pain   Nursing note and vitals reviewed.   Assessment   1. Cervical radiculopathy  2. Left shoulder pain  3. HTN uncontrolled  Plan  1.  Reviewed Xray 10/2016 see HPI Max dose Tylenol 3000/24 hrs, Rx Mobic Rx Flexeril 5 mg qd prn not while working/driving  Disc PT in future if MRI neg  MRI w/o cervical neck  May need to see ortho spine in [place] Dr. Yevette Edwardsumonski  2.  Xray left shoulder see above 3. Increase Lisinopril 10 to 40 mg qd   Provider: Dr. French Anaracy McLean-Scocuzza-Internal Medicine

## 2017-03-30 ENCOUNTER — Ambulatory Visit: Admission: RE | Admit: 2017-03-30 | Payer: 59 | Source: Ambulatory Visit

## 2017-03-30 ENCOUNTER — Ambulatory Visit: Payer: 59 | Admitting: Family

## 2017-04-17 ENCOUNTER — Other Ambulatory Visit: Payer: Self-pay | Admitting: Family

## 2017-04-17 DIAGNOSIS — I10 Essential (primary) hypertension: Secondary | ICD-10-CM

## 2017-04-17 MED ORDER — LISINOPRIL 40 MG PO TABS: 40.0000 mg | ORAL_TABLET | Freq: Every day | ORAL | 0 refills | Status: DC

## 2017-04-17 NOTE — Telephone Encounter (Signed)
Medication has been refilled.

## 2017-04-17 NOTE — Telephone Encounter (Signed)
Copied from CRM [id number]. Topic: General - Other >> Apr 17, 2017  9:37 AM Cecelia ByarsGreen, Deyani Hegarty L, RMA wrote: Reason for CRM: Medication refill request for lisinopril 40 mg to be sent to [facility] [address]

## 2017-05-22 ENCOUNTER — Ambulatory Visit: Payer: 59 | Admitting: Family

## 2017-06-01 ENCOUNTER — Other Ambulatory Visit: Payer: Self-pay | Admitting: Family

## 2017-06-01 DIAGNOSIS — E785 Hyperlipidemia, unspecified: Secondary | ICD-10-CM

## 2017-06-01 DIAGNOSIS — I10 Essential (primary) hypertension: Secondary | ICD-10-CM

## 2017-06-06 ENCOUNTER — Telehealth: Payer: Self-pay | Admitting: Family

## 2017-06-06 NOTE — Telephone Encounter (Signed)
Please mail letter-   Hope you are well.   In reviewing your chart, it appears your are due for annual mammogram.  Please let us know if you would like for me to order. You may call the office to let us know, and we will order for you.   Once the order in the system, you may schedule at your preferred location.   Typically women have been using one of the sites below however you may go where you have been in the past. I would ensure that when you do get a mammogram that it is 3D ( as opposed to 2D which was prior technology). Evidence suggests that 3D is superior.   Please note that NOT all insurance companies cover 3D, and you may have to pay a higher copay. You may call your insurance company to further clarify your benefits.   Options for Mammogram in [place]:    [facility]  [address]  [place], [place]  [phone]   [facility] [address] [place], [place] [phone]   Let us know if you have questions.   My best,   Jerriah Ines, NP   

## 2017-06-11 IMAGING — CR DG CHEST 2V
2 series · 2 of 2 positions shown · non-contrast
Comparison: None.

CLINICAL DATA: 40-year-old female with cough

EXAM:
CHEST  2 VIEW

[w chest pa]
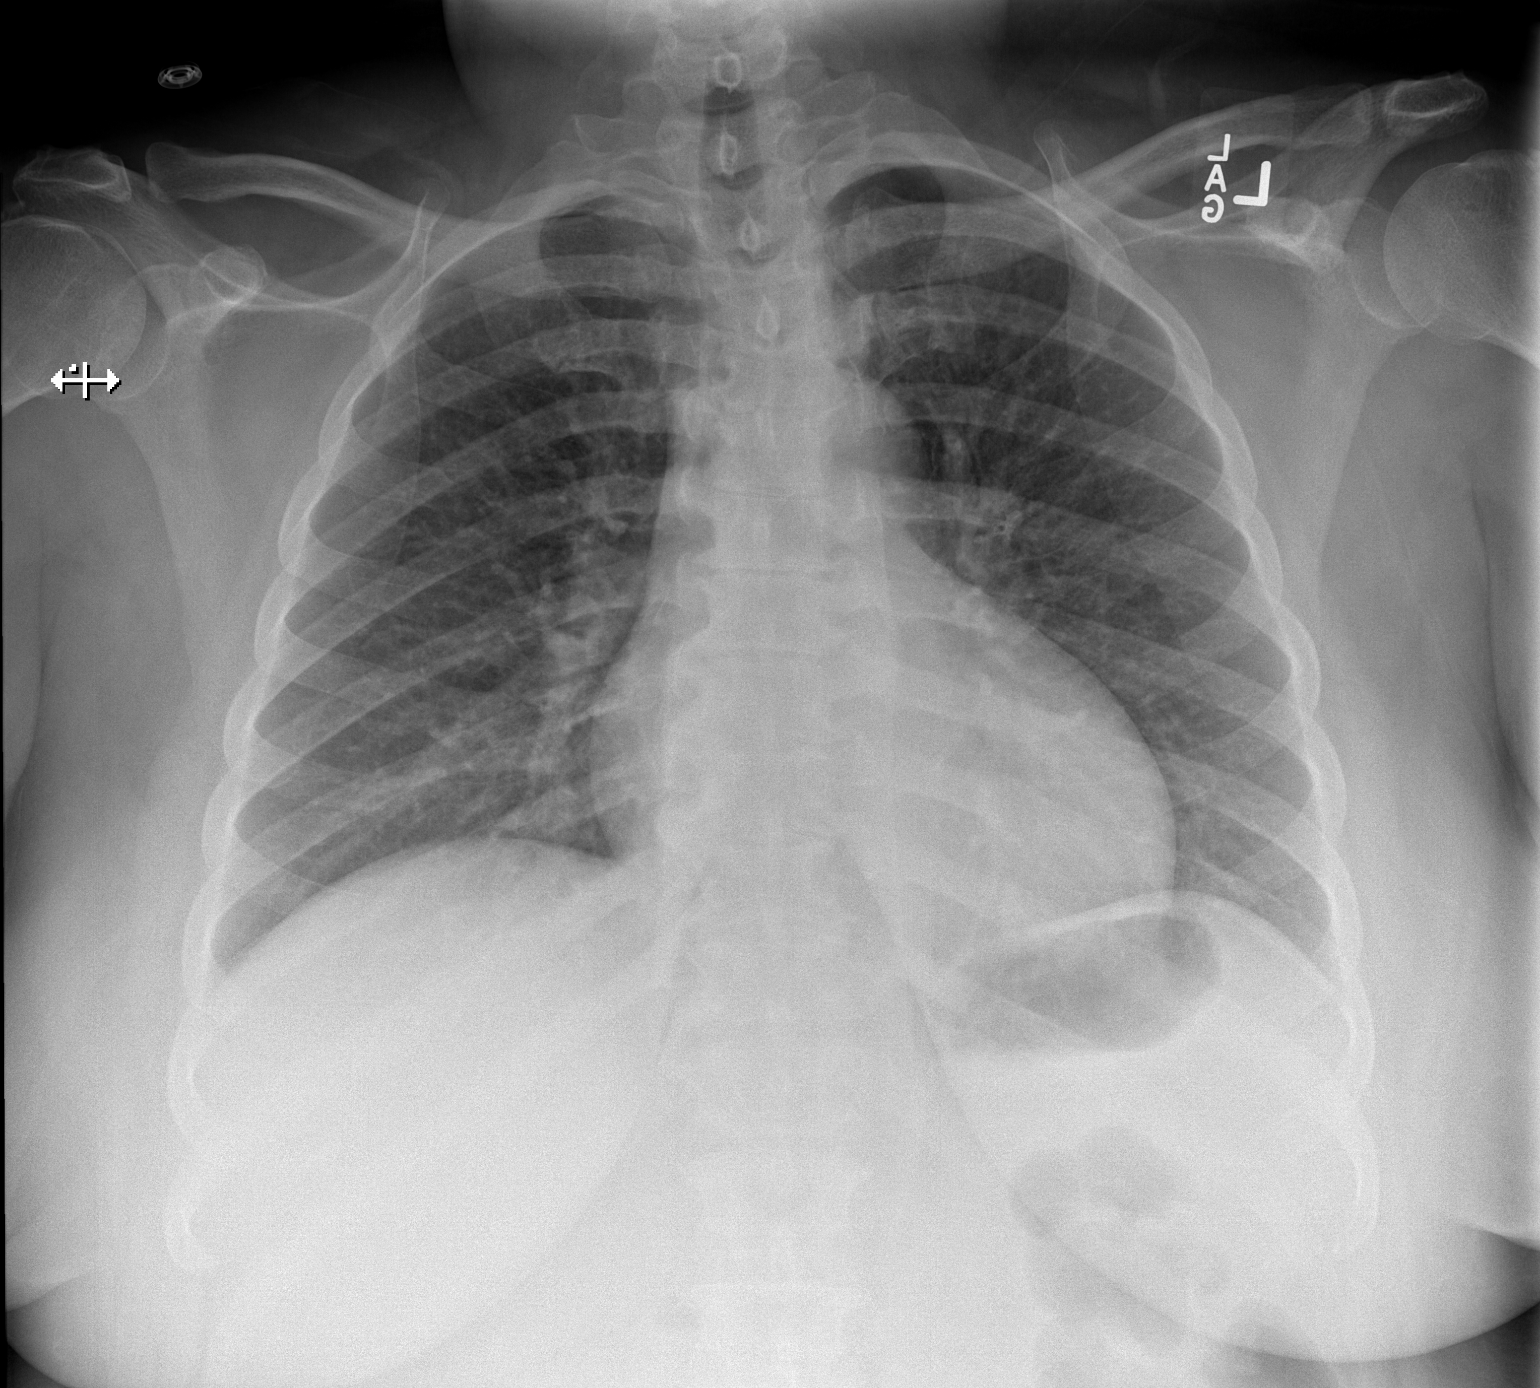

[w chest lat]
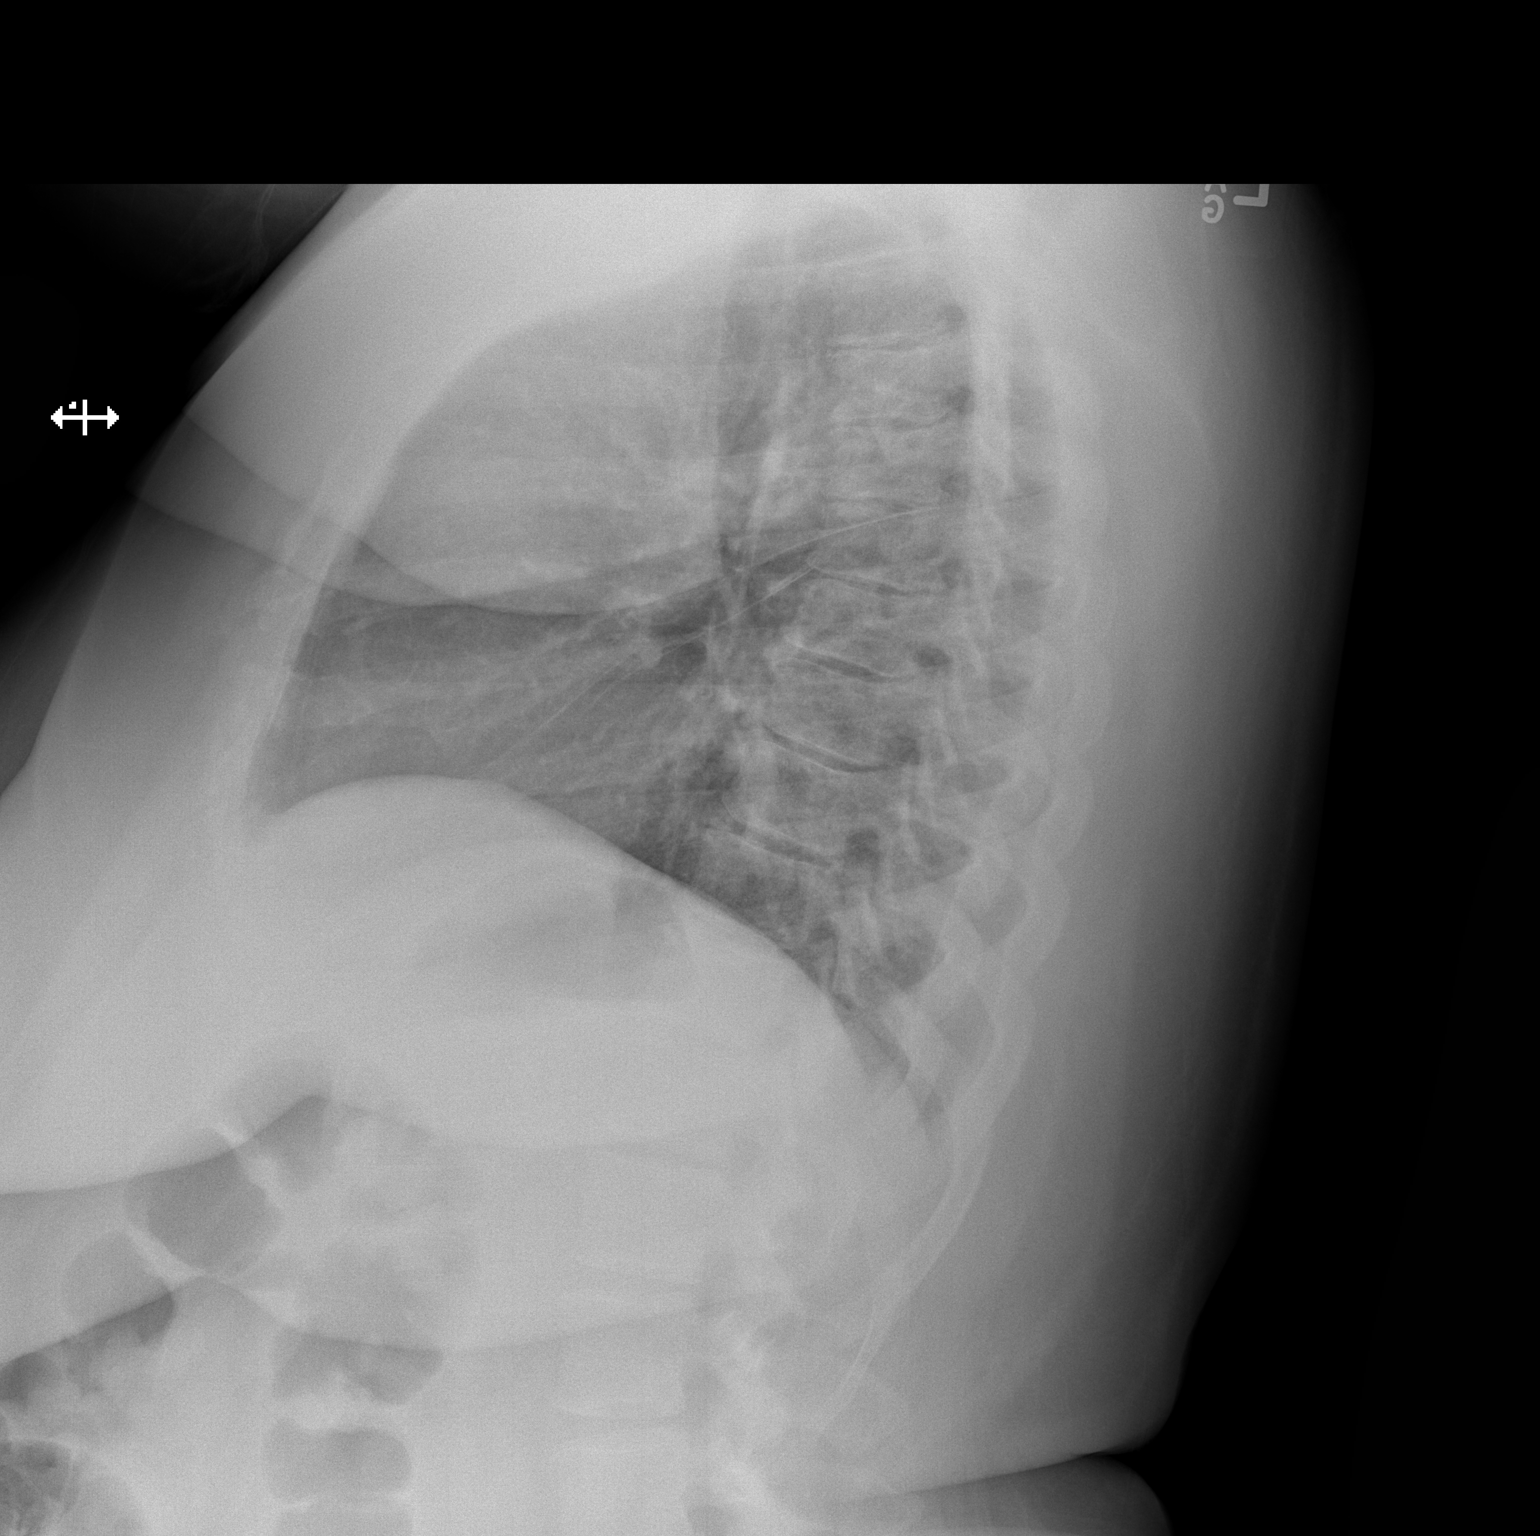

[2 of 2 positions shown; findings below may reference images not displayed]

FINDINGS: The heart size and mediastinal contours are within normal limits.
Both lungs are clear. The visualized skeletal structures are
unremarkable.
IMPRESSION: No active cardiopulmonary disease.

## 2017-06-12 IMAGING — CT CT HEAD W/O CM
1 series · 16 of 30 positions shown, 20 images · non-contrast
Comparison: None

CLINICAL DATA: Fever, headache, abdominal pain, tick bite, history
smoking, alcohol abuse, psoriasis, hypertension, diabetes mellitus,
sleep apnea

EXAM:
CT HEAD WITHOUT CONTRAST
TECHNIQUE: Contiguous axial images were obtained from the base of the skull
through the vertex without intravenous contrast.

[Series 2: headseq 4.8 h45s · axial · 0.44mm/px · z∈[+1237,+1391]mm · 16 of 36 slices shown, 20 images]
[im 2/36  brain]
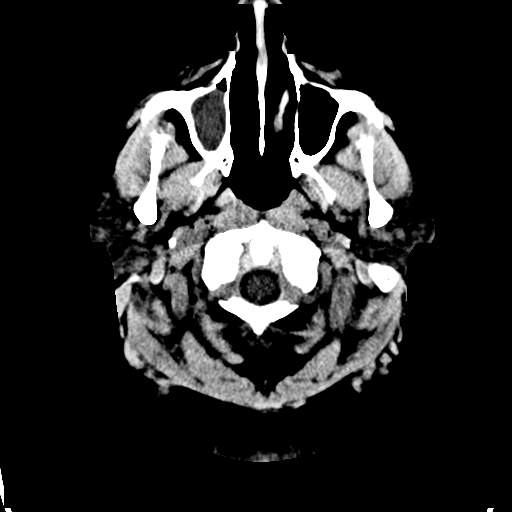
[im 2/36  bone]
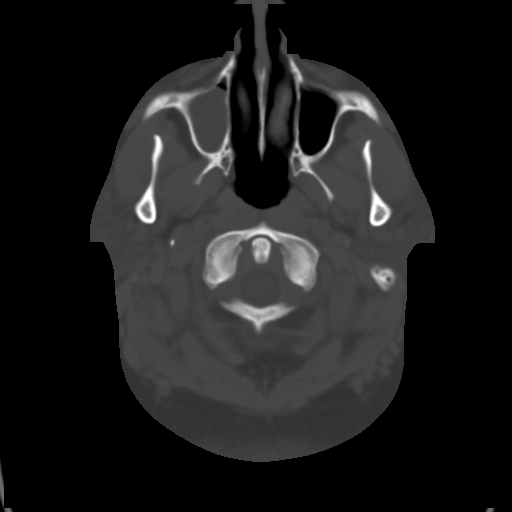
[im 4/36  brain]
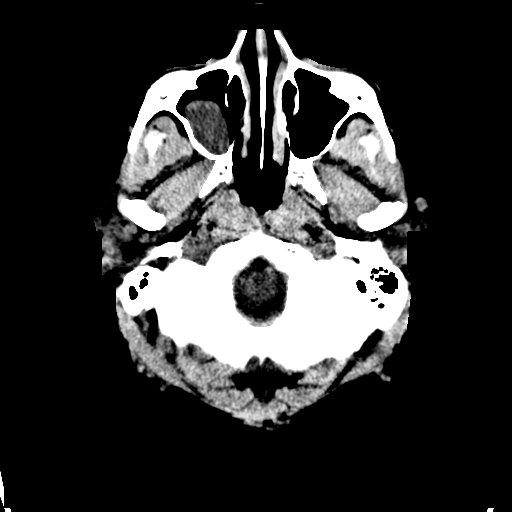
[im 7/36  brain]
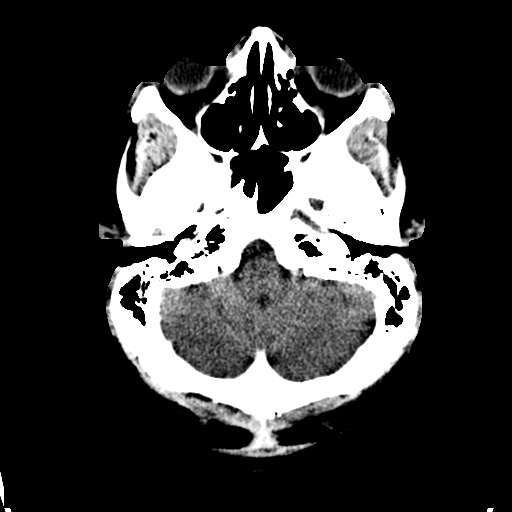
[im 9/36  brain]
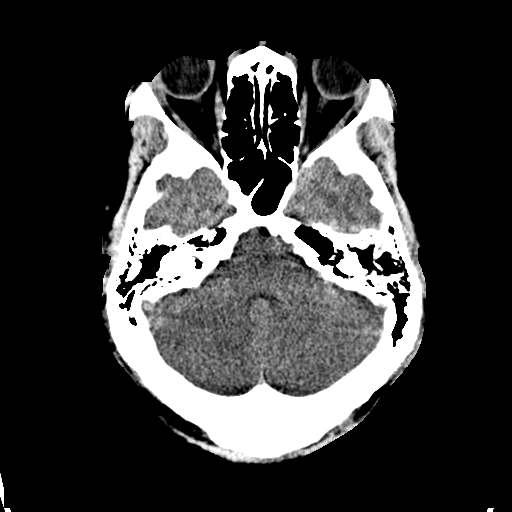
[im 10/36  brain]
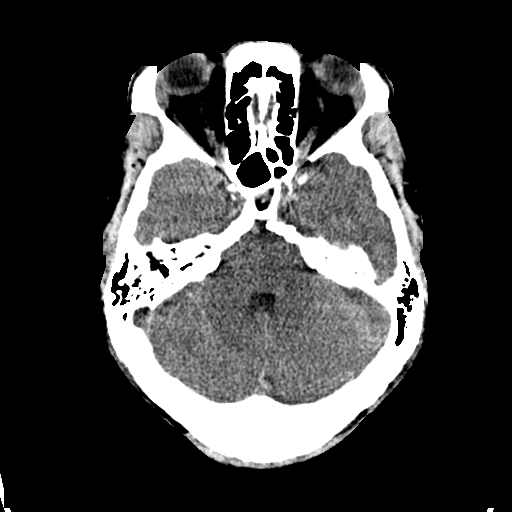
[im 10/36  bone]
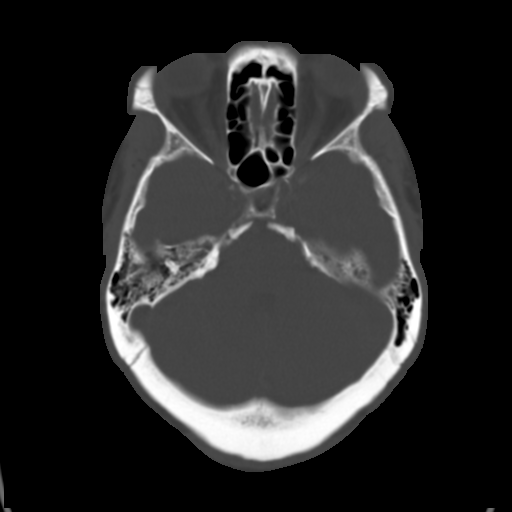
[im 13/36  brain]
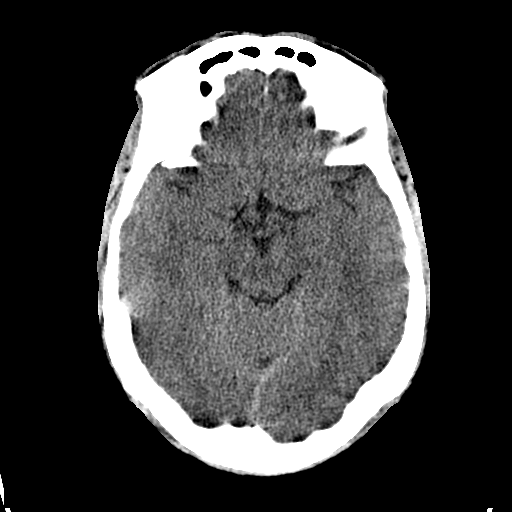
[im 15/36  brain]
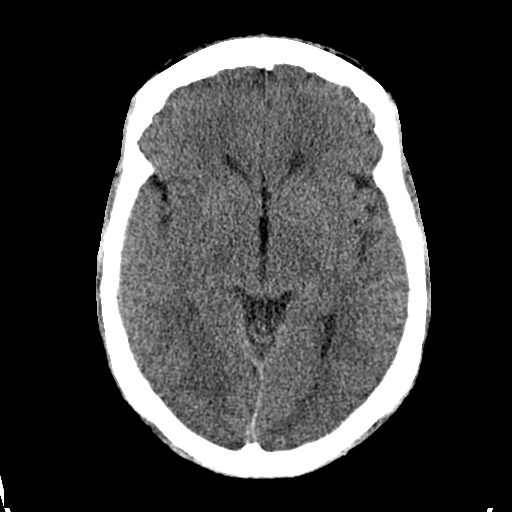
[im 17/36  brain]
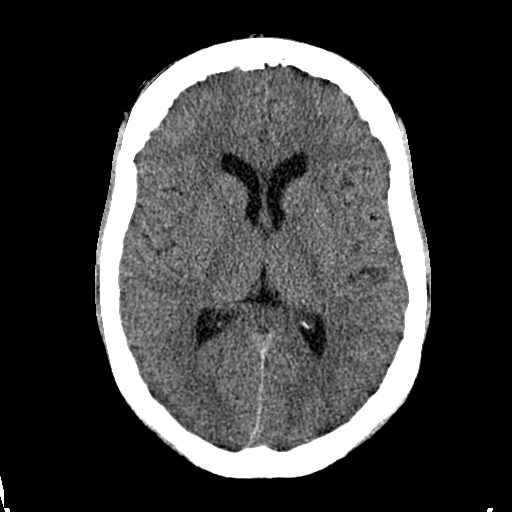
[im 19/36  brain]
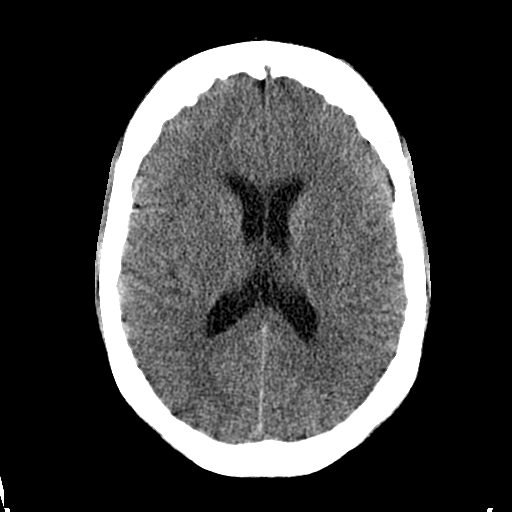
[im 19/36  bone]
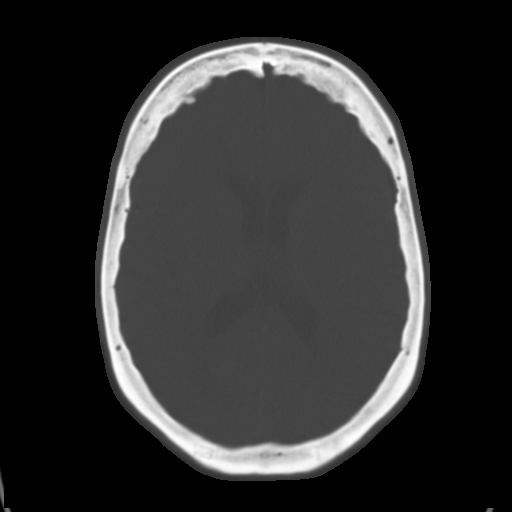
[im 21/36  brain]
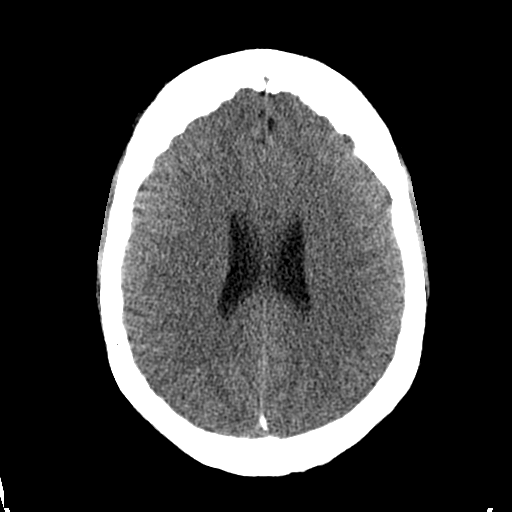
[im 23/36  brain]
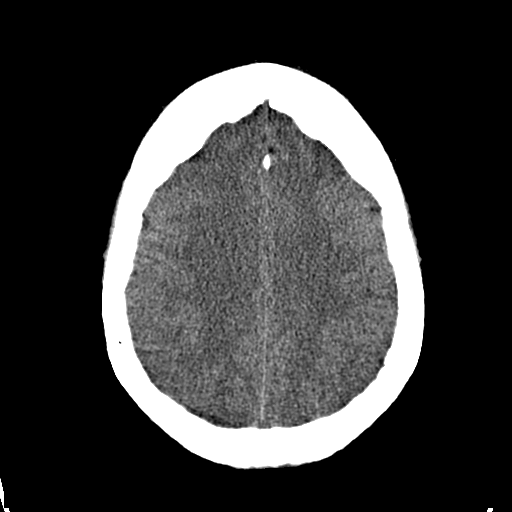
[im 26/36  brain]
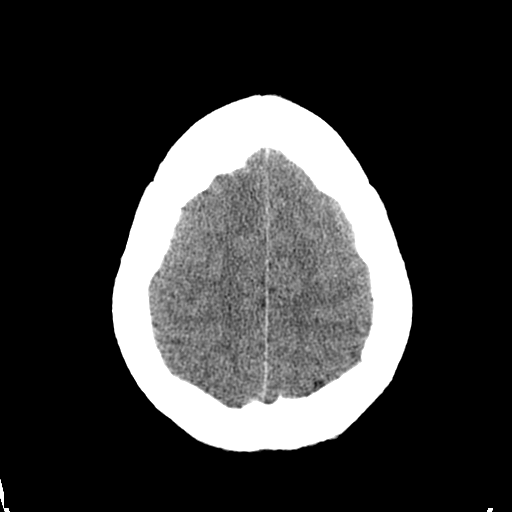
[im 27/36  brain]
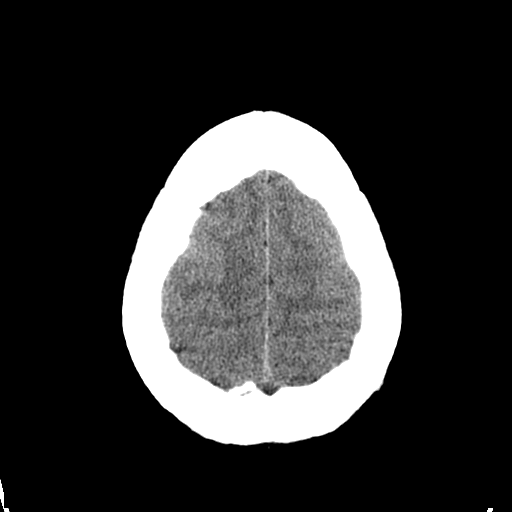
[im 27/36  bone]
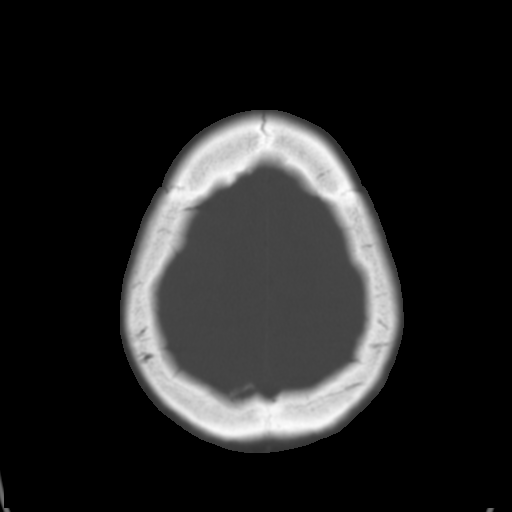
[im 29/36  brain]
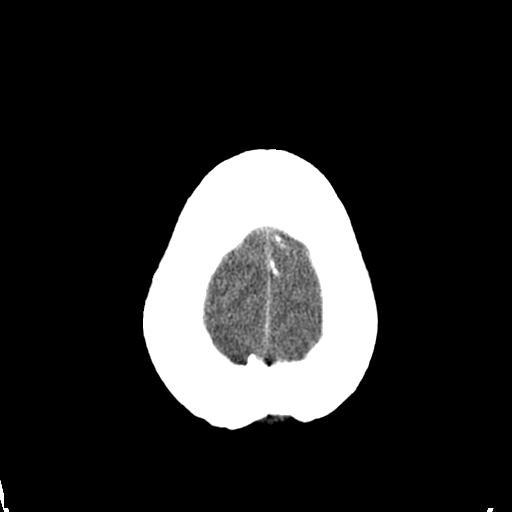
[im 32/36  brain]
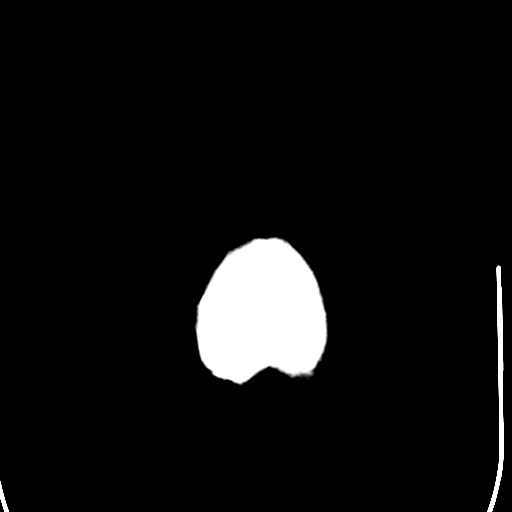
[im 34/36  brain]
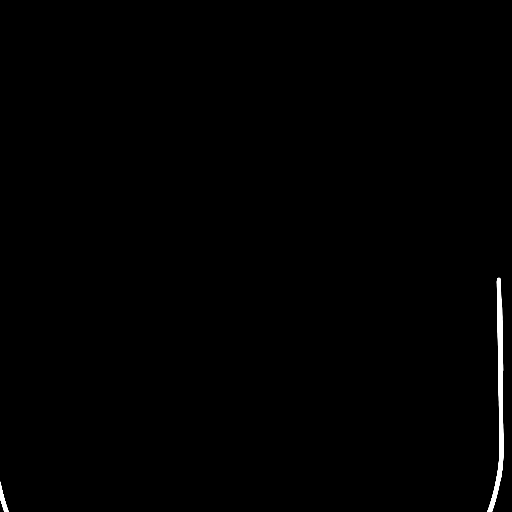

[16 of 30 positions shown; findings below may reference images not displayed]

FINDINGS: Normal ventricular morphology.

No midline shift or mass effect.

Normal appearance of brain parenchyma.

No intracranial hemorrhage, mass lesion or evidence acute
infarction.

No extra-axial fluid collections.

Large probable mucosal retention cyst RIGHT maxillary sinus.

Sinuses and mastoid air cells otherwise unremarkable.

Osseous structures normal appearance.
IMPRESSION: No acute intracranial abnormalities.

## 2017-06-12 NOTE — Telephone Encounter (Signed)
Mychart message sent.

## 2017-08-16 ENCOUNTER — Ambulatory Visit: Payer: Self-pay | Admitting: Obstetrics and Gynecology

## 2017-08-21 ENCOUNTER — Other Ambulatory Visit: Payer: Self-pay | Admitting: Family

## 2017-08-21 DIAGNOSIS — E1165 Type 2 diabetes mellitus with hyperglycemia: Secondary | ICD-10-CM

## 2017-08-22 NOTE — Telephone Encounter (Signed)
Last filled 89/17/18 Next office visit 08/24/17 Previously filled by Claris Che

## 2017-08-23 NOTE — Telephone Encounter (Signed)
Sent metformin to both mail order and [facility]  Not sure if this was correct but there were two pended meds so wasn't sure if that was on purpose

## 2017-08-24 ENCOUNTER — Ambulatory Visit (INDEPENDENT_AMBULATORY_CARE_PROVIDER_SITE_OTHER): Payer: Managed Care, Other (non HMO) | Admitting: Internal Medicine

## 2017-08-24 ENCOUNTER — Ambulatory Visit (INDEPENDENT_AMBULATORY_CARE_PROVIDER_SITE_OTHER): Payer: Managed Care, Other (non HMO)

## 2017-08-24 ENCOUNTER — Other Ambulatory Visit (HOSPITAL_COMMUNITY)
Admission: RE | Admit: 2017-08-24 | Discharge: 2017-08-24 | Disposition: A | Discharge status: Home / Self Care | Payer: Managed Care, Other (non HMO) | Source: Ambulatory Visit | Attending: Internal Medicine | Admitting: Internal Medicine

## 2017-08-24 ENCOUNTER — Encounter: Payer: Self-pay | Admitting: Internal Medicine

## 2017-08-24 VITALS — BP 176/100 | HR 105 | Temp 98.4°F | Ht 64.0 in | Wt 316.8 lb

## 2017-08-24 DIAGNOSIS — G8929 Other chronic pain: Secondary | ICD-10-CM

## 2017-08-24 DIAGNOSIS — E119 Type 2 diabetes mellitus without complications: Secondary | ICD-10-CM

## 2017-08-24 DIAGNOSIS — E559 Vitamin D deficiency, unspecified: Secondary | ICD-10-CM

## 2017-08-24 DIAGNOSIS — Z1159 Encounter for screening for other viral diseases: Secondary | ICD-10-CM | POA: Diagnosis not present

## 2017-08-24 DIAGNOSIS — Z1329 Encounter for screening for other suspected endocrine disorder: Secondary | ICD-10-CM | POA: Diagnosis not present

## 2017-08-24 DIAGNOSIS — M25512 Pain in left shoulder: Secondary | ICD-10-CM

## 2017-08-24 DIAGNOSIS — Z124 Encounter for screening for malignant neoplasm of cervix: Secondary | ICD-10-CM | POA: Diagnosis not present

## 2017-08-24 DIAGNOSIS — Z113 Encounter for screening for infections with a predominantly sexual mode of transmission: Secondary | ICD-10-CM

## 2017-08-24 DIAGNOSIS — I10 Essential (primary) hypertension: Secondary | ICD-10-CM

## 2017-08-24 DIAGNOSIS — E785 Hyperlipidemia, unspecified: Secondary | ICD-10-CM

## 2017-08-24 DIAGNOSIS — K219 Gastro-esophageal reflux disease without esophagitis: Secondary | ICD-10-CM | POA: Diagnosis not present

## 2017-08-24 DIAGNOSIS — M5412 Radiculopathy, cervical region: Secondary | ICD-10-CM

## 2017-08-24 DIAGNOSIS — B9689 Other specified bacterial agents as the cause of diseases classified elsewhere: Secondary | ICD-10-CM | POA: Diagnosis not present

## 2017-08-24 DIAGNOSIS — M542 Cervicalgia: Secondary | ICD-10-CM

## 2017-08-24 DIAGNOSIS — Z1231 Encounter for screening mammogram for malignant neoplasm of breast: Secondary | ICD-10-CM

## 2017-08-24 DIAGNOSIS — E1165 Type 2 diabetes mellitus with hyperglycemia: Secondary | ICD-10-CM | POA: Diagnosis not present

## 2017-08-24 DIAGNOSIS — Z0000 Encounter for general adult medical examination without abnormal findings: Secondary | ICD-10-CM | POA: Diagnosis not present

## 2017-08-24 DIAGNOSIS — N760 Acute vaginitis: Secondary | ICD-10-CM | POA: Insufficient documentation

## 2017-08-24 DIAGNOSIS — G4733 Obstructive sleep apnea (adult) (pediatric): Secondary | ICD-10-CM

## 2017-08-24 DIAGNOSIS — D649 Anemia, unspecified: Secondary | ICD-10-CM

## 2017-08-24 MED ORDER — PRAVASTATIN SODIUM 40 MG PO TABS: 40.0000 mg | ORAL_TABLET | Freq: Every day | ORAL | 3 refills | Status: DC

## 2017-08-24 MED ORDER — HYDROCHLOROTHIAZIDE 25 MG PO TABS: 25.0000 mg | ORAL_TABLET | Freq: Every day | ORAL | 1 refills | Status: DC

## 2017-08-24 MED ORDER — METFORMIN HCL 500 MG PO TABS: ORAL_TABLET | ORAL | 1 refills | Status: DC

## 2017-08-24 MED ORDER — LISINOPRIL 40 MG PO TABS: 40.0000 mg | ORAL_TABLET | Freq: Every day | ORAL | 1 refills | Status: DC

## 2017-08-24 MED ORDER — CYCLOBENZAPRINE HCL 5 MG PO TABS: 5.0000 mg | ORAL_TABLET | Freq: Every day | ORAL | 0 refills | Status: DC | PRN

## 2017-08-24 NOTE — Progress Notes (Signed)
Chief Complaint  Patient presents with  . Annual Exam   Annual with complaints  1. C/o left shoulder and neck pain was unable to get MRI cervical 2/2 claustrophobic and requests open MRI  2. She needs refills on medications  3. HTN uncontrolled on lis 40 mg qd home readings 150s-190s/90s-100s she does have OSA on cpap 4. C/o GERD sx's relieved by tums  5. Would like sTD check today    Review of Systems  Constitutional: Negative for weight loss.  HENT: Negative for hearing loss.   Respiratory: Negative for shortness of breath.   Cardiovascular: Negative for chest pain.  Gastrointestinal: Positive for heartburn.  Musculoskeletal: Positive for joint pain and neck pain.  Skin: Negative for rash.  Neurological: Negative for headaches.  Psychiatric/Behavioral: Negative for depression.   Past Medical History:  Diagnosis Date  . Alcohol abuse   . Arthritis   . Cervical radiculopathy   . Diabetes mellitus without complication (HCC) 2000  . Hyperlipidemia   . Hypertension   . Morbid obesity (HCC)   . OSA (obstructive sleep apnea)    Not on CPAP  . Psoriasis   . Tobacco abuse    No past surgical history on file. Family History  Problem Relation Age of Onset  . Alcohol abuse Maternal Grandmother   . Cancer Maternal Grandmother        Ovarian  . Hypertension Maternal Grandmother   . Diabetes Maternal Grandmother   . Hypertension Mother   . Diabetes Mother   . Colon cancer Other 60   Social History   Socioeconomic History  . Marital status: Single    Spouse name: Not on file  . Number of children: 0  . Years of education: Not on file  . Highest education level: Not on file  Occupational History  . Not on file  Social Needs  . Financial resource strain: Not on file  . Food insecurity:    Worry: Not on file    Inability: Not on file  . Transportation needs:    Medical: Not on file    Non-medical: Not on file  Tobacco Use  . Smoking status: Former Smoker   Packs/day: 0.50    Types: Cigarettes  . Smokeless tobacco: Never Used  . Tobacco comment: stopped 11/2015  Substance and Sexual Activity  . Alcohol use: Yes    Alcohol/week: 0.0 oz    Comment: occasionally  . Drug use: No  . Sexual activity: Yes    Partners: Male    Birth control/protection: None    Comment: 1 partner  Lifestyle  . Physical activity:    Days per week: Not on file    Minutes per session: Not on file  . Stress: Not on file  Relationships  . Social connections:    Talks on phone: Not on file    Gets together: Not on file    Attends religious service: Not on file    Active member of club or organization: Not on file    Attends meetings of clubs or organizations: Not on file    Relationship status: Not on file  . Intimate partner violence:    Fear of current or ex partner: Not on file    Emotionally abused: Not on file    Physically abused: Not on file    Forced sexual activity: Not on file  Other Topics Concern  . 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    Associates Degree   No children    No caffeine   Current Meds  Medication Sig  . acetaminophen (TYLENOL) 650 MG CR tablet Take 650 mg by mouth every 8 (eight) hours as needed for pain.  Marland Kitchen aspirin 81 MG tablet Take 81 mg by mouth daily.  Marland Kitchen aspirin-acetaminophen-caffeine (EXCEDRIN MIGRAINE) 250-250-65 MG tablet Take 1 tablet by mouth every 6 (six) hours as needed for headache.  . clotrimazole-betamethasone (LOTRISONE) cream Apply 1 application topically 2 (two) times daily.  . cyclobenzaprine (FLEXERIL) 5 MG tablet Take 1 tablet (5 mg total) by mouth daily as needed for muscle spasms.  Marland Kitchen lisinopril (PRINIVIL,ZESTRIL) 40 MG tablet Take 1 tablet (40 mg total) by mouth daily.  . metFORMIN (GLUCOPHAGE) 500 MG tablet TAKE 1 TABLET BY MOUTH TWO  TIMES DAILY WITH MEALS  . pravastatin (PRAVACHOL) 40 MG tablet Take 1 tablet (40 mg total) by mouth  daily.  . valACYclovir (VALTREX) 500 MG tablet TAKE 1 TABLET BY MOUTH ONCE DAILY  . [DISCONTINUED] acyclovir (ZOVIRAX) 400 MG tablet Take 1 tablet (400 mg total) by mouth 3 (three) times daily.  . [DISCONTINUED] cyclobenzaprine (FLEXERIL) 5 MG tablet Take 1 tablet (5 mg total) by mouth daily as needed for muscle spasms.  . [DISCONTINUED] gabapentin (NEURONTIN) 100 MG capsule Take 3 capsules (300 mg total) by mouth at bedtime.  . [DISCONTINUED] lisinopril (PRINIVIL,ZESTRIL) 40 MG tablet Take 1 tablet (40 mg total) by mouth daily.  . [DISCONTINUED] meloxicam (MOBIC) 15 MG tablet Take 1 tablet (15 mg total) by mouth daily as needed for pain.  . [DISCONTINUED] metFORMIN (GLUCOPHAGE) 500 MG tablet TAKE 1 TABLET BY MOUTH TWO  TIMES DAILY WITH MEALS  . [DISCONTINUED] pravastatin (PRAVACHOL) 40 MG tablet TAKE 1 TABLET BY MOUTH  DAILY  . [DISCONTINUED] ustekinumab (STELARA) 90 MG/ML SOSY injection every 3 (three) months.   No Known Allergies No results found for this or any previous visit (from the past 2160 hour(s)). Objective  Body mass index is 54.38 kg/m. Wt Readings from Last 3 Encounters:  08/24/17 (!) 316 lb 12.8 oz (143.7 kg)  03/20/17 (!) 316 lb 2 oz (143.4 kg)  12/27/16 (!) 312 lb 12.8 oz (141.9 kg)   Temp Readings from Last 3 Encounters:  08/24/17 98.4 F (36.9 C) (Oral)  03/20/17 98.2 F (36.8 C) (Oral)  12/27/16 98.4 F (36.9 C) (Oral)   BP Readings from Last 3 Encounters:  08/24/17 (!) 176/100  03/20/17 (!) 180/86  12/27/16 (!) 150/88   Pulse Readings from Last 3 Encounters:  08/24/17 (!) 105  03/20/17 (!) 105  12/27/16 (!) 101    Physical Exam  Constitutional: She is oriented to person, place, and time. She appears well-developed and well-nourished. She is cooperative.  HENT:  Head: Normocephalic and atraumatic.  Mouth/Throat: Oropharynx is clear and moist and mucous membranes are normal.  Eyes: Pupils are equal, round, and reactive to light. Conjunctivae are  normal.  Cardiovascular: Regular rhythm and normal heart sounds. Tachycardia present.  Pulmonary/Chest: Effort normal and breath sounds normal. Right breast exhibits no inverted nipple, no mass, no nipple discharge, no skin change and no tenderness. Left breast exhibits no inverted nipple, no mass, no nipple discharge, no skin change and no tenderness. No breast swelling, tenderness, discharge or bleeding. Breasts are symmetrical.  Genitourinary: Vagina normal and uterus normal. Pelvic exam was performed with patient supine. Cervix exhibits discharge. Cervix exhibits no motion tenderness and no friability. Right adnexum displays  no mass, no tenderness and no fullness. Left adnexum displays no mass, no tenderness and no fullness.  Neurological: She is alert and oriented to person, place, and time. Gait normal.  Skin: Skin is warm, dry and intact.  Psychiatric: She has a normal mood and affect. Her speech is normal and behavior is normal. Judgment and thought content normal. Cognition and memory are normal.  Nursing note and vitals reviewed. hypopigmentation to skin of labia b/l   Assessment   1. Annual  2. Left shoulder and neck pain  3. GERD  4. HTN uncontrolled  5. DM 2  Plan   1.  Breast exam and pap today  Had flu shot  Tdap had  pna 23 had  Referred mammo  rec healthy diet and exercise  rec stop lotrisone to vaginal area.  Labs today  2. Xray left shoulder today  MRI c spine open in [place] 3. Prn tums or zantac  4. Lis 40 add hctz 12.5 x 3 days and then 25 mg qd  5. Check A1C  Urine due 12/27/17  Ask about eye exam and do foot exam in future   Psoriasis she is not longer on Stelara but will find out name of medication starts with T     Provider: Dr. French Ana McLean-Scocuzza-Internal Medicine

## 2017-08-24 NOTE — Patient Instructions (Signed)
F/u in 3 weeks  Start hydrochlorothizadie 1/2 pill x 3 days then 1 pill daily   DASH Eating Plan DASH stands for "Dietary Approaches to Stop Hypertension." The DASH eating plan is a healthy eating plan that has been shown to reduce high blood pressure (hypertension). It may also reduce your risk for type 2 diabetes, heart disease, and stroke. The DASH eating plan may also help with weight loss. What are tips for following this plan? General guidelines  Avoid eating more than 2,300 mg (milligrams) of salt (sodium) a day. If you have hypertension, you may need to reduce your sodium intake to 1,500 mg a day.  Limit alcohol intake to no more than 1 drink a day for nonpregnant women and 2 drinks a day for men. One drink equals 12 oz of beer, 5 oz of wine, or 1 oz of hard liquor.  Work with your health care provider to maintain a healthy body weight or to lose weight. Ask what an ideal weight is for you.  Get at least 30 minutes of exercise that causes your heart to beat faster (aerobic exercise) most days of the week. Activities may include walking, swimming, or biking.  Work with your health care provider or diet and nutrition specialist (dietitian) to adjust your eating plan to your individual calorie needs. Reading food labels  Check food labels for the amount of sodium per serving. Choose foods with less than 5 percent of the Daily Value of sodium. Generally, foods with less than 300 mg of sodium per serving fit into this eating plan.  To find whole grains, look for the word "whole" as the first word in the ingredient list. Shopping  Buy products labeled as "low-sodium" or "no salt added."  Buy fresh foods. Avoid canned foods and premade or frozen meals. Cooking  Avoid adding salt when cooking. Use salt-free seasonings or herbs instead of table salt or sea salt. Check with your health care provider or pharmacist before using salt substitutes.  Do not fry foods. Cook foods using healthy  methods such as baking, boiling, grilling, and broiling instead.  Cook with heart-healthy oils, such as olive, canola, soybean, or sunflower oil. Meal planning   Eat a balanced diet that includes: ? 5 or more servings of fruits and vegetables each day. At each meal, try to fill half of your plate with fruits and vegetables. ? Up to 6-8 servings of whole grains each day. ? Less than 6 oz of lean meat, poultry, or fish each day. A 3-oz serving of meat is about the same size as a deck of cards. One egg equals 1 oz. ? 2 servings of low-fat dairy each day. ? A serving of nuts, seeds, or beans 5 times each week. ? Heart-healthy fats. Healthy fats called Omega-3 fatty acids are found in foods such as flaxseeds and coldwater fish, like sardines, salmon, and mackerel.  Limit how much you eat of the following: ? Canned or prepackaged foods. ? Food that is high in trans fat, such as fried foods. ? Food that is high in saturated fat, such as fatty meat. ? Sweets, desserts, sugary drinks, and other foods with added sugar. ? Full-fat dairy products.  Do not salt foods before eating.  Try to eat at least 2 vegetarian meals each week.  Eat more home-cooked food and less restaurant, buffet, and fast food.  When eating at a restaurant, ask that your food be prepared with less salt or no salt, if possible. What  foods are recommended? The items listed Federici not be a complete list. Talk with your dietitian about what dietary choices are best for you. Grains Whole-grain or whole-wheat bread. Whole-grain or whole-wheat pasta. Brown rice. [place]row. Bulgur. Whole-grain and low-sodium cereals. Pita bread. Low-fat, low-sodium crackers. Whole-wheat flour tortillas. Vegetables Fresh or frozen vegetables (raw, steamed, roasted, or grilled). Low-sodium or reduced-sodium tomato and vegetable juice. Low-sodium or reduced-sodium tomato sauce and tomato paste. Low-sodium or reduced-sodium canned  vegetables. Fruits All fresh, dried, or frozen fruit. Canned fruit in natural juice (without added sugar). Meat and other protein foods Skinless chicken or [place]. Ground chicken or [place]. Pork with fat trimmed off. Fish and seafood. Egg whites. Dried beans, peas, or lentils. Unsalted nuts, nut butters, and seeds. Unsalted canned beans. Lean cuts of beef with fat trimmed off. Low-sodium, lean deli meat. Dairy Low-fat (1%) or fat-free (skim) milk. Fat-free, low-fat, or reduced-fat cheeses. Nonfat, low-sodium ricotta or cottage cheese. Low-fat or nonfat yogurt. Low-fat, low-sodium cheese. Fats and oils Soft margarine without trans fats. Vegetable oil. Low-fat, reduced-fat, or light mayonnaise and salad dressings (reduced-sodium). Canola, safflower, olive, soybean, and sunflower oils. Avocado. Seasoning and other foods Herbs. Spices. Seasoning mixes without salt. Unsalted popcorn and pretzels. Fat-free sweets. What foods are not recommended? The items listed Kubin not be a complete list. Talk with your dietitian about what dietary choices are best for you. Grains Baked goods made with fat, such as croissants, muffins, or some breads. Dry pasta or rice meal packs. Vegetables Creamed or fried vegetables. Vegetables in a cheese sauce. Regular canned vegetables (not low-sodium or reduced-sodium). Regular canned tomato sauce and paste (not low-sodium or reduced-sodium). Regular tomato and vegetable juice (not low-sodium or reduced-sodium). Angie Fava. Olives. Fruits Canned fruit in a light or heavy syrup. Fried fruit. Fruit in cream or butter sauce. Meat and other protein foods Fatty cuts of meat. Ribs. Fried meat. Berniece Salines. Sausage. Bologna and other processed lunch meats. Salami. Fatback. Hotdogs. Bratwurst. Salted nuts and seeds. Canned beans with added salt. Canned or smoked fish. Whole eggs or egg yolks. Chicken or [place] with skin. Dairy Whole or 2% milk, cream, and half-and-half. Whole or full-fat  cream cheese. Whole-fat or sweetened yogurt. Full-fat cheese. Nondairy creamers. Whipped toppings. Processed cheese and cheese spreads. Fats and oils Butter. Stick margarine. Lard. Shortening. Ghee. Bacon fat. Tropical oils, such as coconut, palm kernel, or palm oil. Seasoning and other foods Salted popcorn and pretzels. Onion salt, garlic salt, seasoned salt, table salt, and sea salt. Worcestershire sauce. Tartar sauce. Barbecue sauce. Teriyaki sauce. Soy sauce, including reduced-sodium. Steak sauce. Canned and packaged gravies. Fish sauce. Oyster sauce. Cocktail sauce. Horseradish that you find on the shelf. Ketchup. Mustard. Meat flavorings and tenderizers. Bouillon cubes. Hot sauce and Tabasco sauce. Premade or packaged marinades. Premade or packaged taco seasonings. Relishes. Regular salad dressings. Where to find more information:  National Heart, Lung, and [place]: https://wilson-eaton.com/  American Heart Association: www.heart.org Summary  The DASH eating plan is a healthy eating plan that has been shown to reduce high blood pressure (hypertension). It Kitzmiller also reduce your risk for type 2 diabetes, heart disease, and stroke.  With the DASH eating plan, you should limit salt (sodium) intake to 2,300 mg a day. If you have hypertension, you Mcc[place]hey need to reduce your sodium intake to 1,500 mg a day.  When on the DASH eating plan, aim to eat more fresh fruits and vegetables, whole grains, lean proteins, low-fat dairy, and heart-healthy fats.  Work with  your health care provider or diet and nutrition specialist (dietitian) to adjust your eating plan to your individual calorie needs. This information is not intended to replace advice given to you by your health care provider. Make sure you discuss any questions you have with your health care provider. Document Released: 03/10/2011 Document Revised: 03/14/2016 Document Reviewed: 03/14/2016 Elsevier Interactive Patient Education  2018 [employer].  Hypertension Hypertension, commonly called high blood pressure, is when the force of blood pumping through the arteries is too strong. The arteries are the blood vessels that carry blood from the heart throughout the body. Hypertension forces the heart to work harder to pump blood and may cause arteries to become narrow or stiff. Having untreated or uncontrolled hypertension can cause heart attacks, strokes, kidney disease, and other problems. A blood pressure reading consists of a higher number over a lower number. Ideally, your blood pressure should be below 120/80. The first ("top") number is called the systolic pressure. It is a measure of the pressure in your arteries as your heart beats. The second ("bottom") number is called the diastolic pressure. It is a measure of the pressure in your arteries as the heart relaxes. What are the causes? The cause of this condition is not known. What increases the risk? Some risk factors for high blood pressure are under your control. Others are not. Factors you can change  Smoking.  Having type 2 diabetes mellitus, high cholesterol, or both.  Not getting enough exercise or physical activity.  Being overweight.  Having too much fat, sugar, calories, or salt (sodium) in your diet.  Drinking too much alcohol. Factors that are difficult or impossible to change  Having chronic kidney disease.  Having a family history of high blood pressure.  Age. Risk increases with age.  Race. You may be at higher risk if you are African-American.  Gender. Men are at higher risk than women before age 52. After age 41, women are at higher risk than men.  Having obstructive sleep apnea.  Stress. What are the signs or symptoms? Extremely high blood pressure (hypertensive crisis) may cause:  Headache.  Anxiety.  Shortness of breath.  Nosebleed.  Nausea and vomiting.  Severe chest pain.  Jerky movements you cannot control (seizures).  How  is this diagnosed? This condition is diagnosed by measuring your blood pressure while you are seated, with your arm resting on a surface. The cuff of the blood pressure monitor will be placed directly against the skin of your upper arm at the level of your heart. It should be measured at least twice using the same arm. Certain conditions can cause a difference in blood pressure between your right and left arms. Certain factors can cause blood pressure readings to be lower or higher than normal (elevated) for a short period of time:  When your blood pressure is higher when you are in a health care provider's office than when you are at home, this is called white coat hypertension. Most people with this condition do not need medicines.  When your blood pressure is higher at home than when you are in a health care provider's office, this is called masked hypertension. Most people with this condition may need medicines to control blood pressure.  If you have a high blood pressure reading during one visit or you have normal blood pressure with other risk factors:  You may be asked to return on a different day to have your blood pressure checked again.  You may  be asked to monitor your blood pressure at home for 1 week or longer.  If you are diagnosed with hypertension, you may have other blood or imaging tests to help your health care provider understand your overall risk for other conditions. How is this treated? This condition is treated by making healthy lifestyle changes, such as eating healthy foods, exercising more, and reducing your alcohol intake. Your health care provider may prescribe medicine if lifestyle changes are not enough to get your blood pressure under control, and if:  Your systolic blood pressure is above 130.  Your diastolic blood pressure is above 80.  Your personal target blood pressure may vary depending on your medical conditions, your age, and other factors. Follow these  instructions at home: Eating and drinking  Eat a diet that is high in fiber and potassium, and low in sodium, added sugar, and fat. An example eating plan is called the DASH (Dietary Approaches to Stop Hypertension) diet. To eat this way: ? Eat plenty of fresh fruits and vegetables. Try to fill half of your plate at each meal with fruits and vegetables. ? Eat whole grains, such as whole wheat pasta, brown rice, or whole grain bread. Fill about one quarter of your plate with whole grains. ? Eat or drink low-fat dairy products, such as skim milk or low-fat yogurt. ? Avoid fatty cuts of meat, processed or cured meats, and poultry with skin. Fill about one quarter of your plate with lean proteins, such as fish, chicken without skin, beans, eggs, and tofu. ? Avoid premade and processed foods. These tend to be higher in sodium, added sugar, and fat.  Reduce your daily sodium intake. Most people with hypertension should eat less than 1,500 mg of sodium a day.  Limit alcohol intake to no more than 1 drink a day for nonpregnant women and 2 drinks a day for men. One drink equals 12 oz of beer, 5 oz of wine, or 1 oz of hard liquor. Lifestyle  Work with your health care provider to maintain a healthy body weight or to lose weight. Ask what an ideal weight is for you.  Get at least 30 minutes of exercise that causes your heart to beat faster (aerobic exercise) most days of the week. Activities may include walking, swimming, or biking.  Include exercise to strengthen your muscles (resistance exercise), such as pilates or lifting weights, as part of your weekly exercise routine. Try to do these types of exercises for 30 minutes at least 3 days a week.  Do not use any products that contain nicotine or tobacco, such as cigarettes and e-cigarettes. If you need help quitting, ask your health care provider.  Monitor your blood pressure at home as told by your health care provider.  Keep all follow-up visits as  told by your health care provider. This is important. Medicines  Take over-the-counter and prescription medicines only as told by your health care provider. Follow directions carefully. Blood pressure medicines must be taken as prescribed.  Do not skip doses of blood pressure medicine. Doing this puts you at risk for problems and can make the medicine less effective.  Ask your health care provider about side effects or reactions to medicines that you should watch for. Contact a health care provider if:  You think you are having a reaction to a medicine you are taking.  You have headaches that keep coming back (recurring).  You feel dizzy.  You have swelling in your ankles.  You have  trouble with your vision. Get help right away if:  You develop a severe headache or confusion.  You have unusual weakness or numbness.  You feel faint.  You have severe pain in your chest or abdomen.  You vomit repeatedly.  You have trouble breathing. Summary  Hypertension is when the force of blood pumping through your arteries is too strong. If this condition is not controlled, it may put you at risk for serious complications.  Your personal target blood pressure may vary depending on your medical conditions, your age, and other factors. For most people, a normal blood pressure is less than 120/80.  Hypertension is treated with lifestyle changes, medicines, or a combination of both. Lifestyle changes include weight loss, eating a healthy, low-sodium diet, exercising more, and limiting alcohol. This information is not intended to replace advice given to you by your health care provider. Make sure you discuss any questions you have with your health care provider. Document Released: 03/21/2005 Document Revised: 02/17/2016 Document Reviewed: 02/17/2016 Elsevier Interactive Patient Education  [employer].

## 2017-08-24 NOTE — Addendum Note (Signed)
Addended by: Penne Lash on: 08/24/2017 01:08 PM   Modules accepted: Orders

## 2017-08-24 NOTE — Progress Notes (Signed)
Pre visit review using our clinic review tool, if applicable. No additional management support is needed unless otherwise documented below in the visit note. 

## 2017-08-25 ENCOUNTER — Other Ambulatory Visit: Payer: Self-pay | Admitting: Internal Medicine

## 2017-08-25 DIAGNOSIS — E559 Vitamin D deficiency, unspecified: Secondary | ICD-10-CM

## 2017-08-25 LAB — HEMOGLOBIN A1C
ESTIMATED AVERAGE GLUCOSE: 146 mg/dL
Hgb A1c MFr Bld: 6.7 % — ABNORMAL HIGH (ref 4.8–5.6)

## 2017-08-25 MED ORDER — CHOLECALCIFEROL 1.25 MG (50000 UT) PO CAPS: 50000.0000 [IU] | ORAL_CAPSULE | ORAL | 1 refills | Status: DC

## 2017-08-25 NOTE — Addendum Note (Signed)
Addended by: Warden Fillers on: 08/25/2017 09:00 AM   Modules accepted: Orders

## 2017-08-26 ENCOUNTER — Other Ambulatory Visit: Payer: Self-pay | Admitting: Internal Medicine

## 2017-08-26 LAB — CBC WITH DIFFERENTIAL/PLATELET
BASOS ABS: 0 10*3/uL (ref 0.0–0.2)
Basos: 1 %
EOS (ABSOLUTE): 0.2 10*3/uL (ref 0.0–0.4)
Eos: 3 %
HEMATOCRIT: 37.4 % (ref 34.0–46.6)
HEMOGLOBIN: 11.8 g/dL (ref 11.1–15.9)
Immature Grans (Abs): 0 10*3/uL (ref 0.0–0.1)
Immature Granulocytes: 0 %
Lymphocytes Absolute: 2.5 10*3/uL (ref 0.7–3.1)
Lymphs: 37 %
MCH: 25.7 pg — ABNORMAL LOW (ref 26.6–33.0)
MCHC: 31.6 g/dL (ref 31.5–35.7)
MCV: 81 fL (ref 79–97)
MONOCYTES: 9 %
MONOS ABS: 0.6 10*3/uL (ref 0.1–0.9)
NEUTROS ABS: 3.4 10*3/uL (ref 1.4–7.0)
Neutrophils: 50 %
Platelets: 470 10*3/uL — ABNORMAL HIGH (ref 150–450)
RBC: 4.6 x10E6/uL (ref 3.77–5.28)
RDW: 15.4 % (ref 12.3–15.4)
WBC: 6.6 10*3/uL (ref 3.4–10.8)

## 2017-08-26 LAB — SPECIMEN STATUS REPORT

## 2017-08-29 ENCOUNTER — Other Ambulatory Visit: Payer: Self-pay | Admitting: Internal Medicine

## 2017-08-29 DIAGNOSIS — B9689 Other specified bacterial agents as the cause of diseases classified elsewhere: Secondary | ICD-10-CM

## 2017-08-29 DIAGNOSIS — N760 Acute vaginitis: Principal | ICD-10-CM

## 2017-08-29 LAB — CYTOLOGY - PAP
BACTERIAL VAGINITIS: POSITIVE — AB
CANDIDA VAGINITIS: NEGATIVE
CHLAMYDIA, DNA PROBE: NEGATIVE
Diagnosis: NEGATIVE
HPV: NOT DETECTED
Neisseria Gonorrhea: NEGATIVE
TRICH (WINDOWPATH): NEGATIVE

## 2017-08-29 MED ORDER — METRONIDAZOLE 500 MG PO TABS: 500.0000 mg | ORAL_TABLET | Freq: Two times a day (BID) | ORAL | 0 refills | Status: DC

## 2017-08-30 LAB — LIPID PANEL
CHOLESTEROL TOTAL: 147 mg/dL (ref 100–199)
Chol/HDL Ratio: 2.6 ratio (ref 0.0–4.4)
HDL: 56 mg/dL (ref >39)
LDL Calculated: 77 mg/dL (ref 0–99)
Triglycerides: 71 mg/dL (ref 0–149)
VLDL Cholesterol Cal: 14 mg/dL (ref 5–40)

## 2017-08-30 LAB — CBC WITH DIFFERENTIAL/PLATELET

## 2017-08-30 LAB — IRON,TIBC AND FERRITIN PANEL
FERRITIN: 36 ng/mL (ref 15–150)
IRON: 48 ug/dL (ref 27–159)
Iron Saturation: 11 % — ABNORMAL LOW (ref 15–55)
TIBC: 426 ug/dL (ref 250–450)
UIBC: 378 ug/dL (ref 131–425)

## 2017-08-30 LAB — COMPREHENSIVE METABOLIC PANEL
A/G RATIO: 1.4 (ref 1.2–2.2)
ALK PHOS: 98 IU/L (ref 39–117)
ALT: 13 IU/L (ref 0–32)
AST: 15 IU/L (ref 0–40)
Albumin: 4.2 g/dL (ref 3.5–5.5)
BUN / CREAT RATIO: 15 (ref 9–23)
BUN: 7 mg/dL (ref 6–24)
Bilirubin Total: 0.2 mg/dL (ref 0.0–1.2)
CALCIUM: 9 mg/dL (ref 8.7–10.2)
CHLORIDE: 102 mmol/L (ref 96–106)
CO2: 25 mmol/L (ref 20–29)
CREATININE: 0.47 mg/dL — AB (ref 0.57–1.00)
GFR calc Af Amer: 140 mL/min/{1.73_m2} (ref >59)
GFR calc non Af Amer: 121 mL/min/{1.73_m2} (ref >59)
Globulin, Total: 3.1 g/dL (ref 1.5–4.5)
Glucose: 98 mg/dL (ref 65–99)
POTASSIUM: 4.1 mmol/L (ref 3.5–5.2)
SODIUM: 143 mmol/L (ref 134–144)
Total Protein: 7.3 g/dL (ref 6.0–8.5)

## 2017-08-30 LAB — HSV 1 ANTIBODY, IGG: HSV 1 Glycoprotein G Ab, IgG: 0.96 index — ABNORMAL HIGH (ref 0.00–0.90)

## 2017-08-30 LAB — URINALYSIS, ROUTINE W REFLEX MICROSCOPIC
Bilirubin, UA: NEGATIVE
Glucose, UA: NEGATIVE
Ketones, UA: NEGATIVE
LEUKOCYTES UA: NEGATIVE
Nitrite, UA: NEGATIVE
PH UA: 7 (ref 5.0–7.5)
PROTEIN UA: NEGATIVE
RBC UA: NEGATIVE
Specific Gravity, UA: 1.015 (ref 1.005–1.030)
Urobilinogen, Ur: 0.2 mg/dL (ref 0.2–1.0)

## 2017-08-30 LAB — MEASLES/MUMPS/RUBELLA IMMUNITY
MUMPS ABS, IGG: <9 AU/mL — ABNORMAL LOW (ref >10.9)
RUBELLA: 16.9 {index} (ref >0.99)

## 2017-08-30 LAB — VITAMIN D 25 HYDROXY (VIT D DEFICIENCY, FRACTURES): VIT D 25 HYDROXY: 19.5 ng/mL — AB (ref 30.0–100.0)

## 2017-08-30 LAB — HSV 2 ANTIBODY, IGG: HSV 2 IgG, Type Spec: 15.5 index — ABNORMAL HIGH (ref 0.00–0.90)

## 2017-08-30 LAB — RPR: RPR Ser Ql: NONREACTIVE

## 2017-08-30 LAB — TSH: TSH: 2.26 u[IU]/mL (ref 0.450–4.500)

## 2017-08-30 LAB — HIV ANTIBODY (ROUTINE TESTING W REFLEX): HIV SCREEN 4TH GENERATION: NONREACTIVE

## 2017-08-30 LAB — T4, FREE: FREE T4: 1.15 ng/dL (ref 0.82–1.77)

## 2017-08-31 LAB — HM DIABETES EYE EXAM

## 2017-09-12 ENCOUNTER — Ambulatory Visit: Payer: Self-pay | Admitting: Obstetrics and Gynecology

## 2017-09-15 ENCOUNTER — Telehealth: Payer: Self-pay

## 2017-09-15 NOTE — Telephone Encounter (Signed)
Copied from CRM [phone]. Topic: Inquiry >> Sep 15, 2017 11:05 AM Alexander BergeronBarksdale, Harvey B wrote: Reason for CRM: [facility] has denied the MRI, and they want to know if a peer to peer is needed by PCP or cancel the MRI appt, call [facility] @ [phone]

## 2017-09-15 NOTE — Telephone Encounter (Signed)
Im willing to do peer to peer if insurance is willing  What did pt decide   TMS

## 2017-09-21 ENCOUNTER — Encounter: Payer: Self-pay | Admitting: Internal Medicine

## 2017-09-21 ENCOUNTER — Other Ambulatory Visit: Payer: Self-pay

## 2017-09-21 ENCOUNTER — Ambulatory Visit: Payer: Managed Care, Other (non HMO) | Admitting: Internal Medicine

## 2017-09-21 VITALS — BP 146/94 | HR 103 | Temp 98.3°F | Ht 64.0 in | Wt 320.6 lb

## 2017-09-21 DIAGNOSIS — N760 Acute vaginitis: Secondary | ICD-10-CM

## 2017-09-21 DIAGNOSIS — E119 Type 2 diabetes mellitus without complications: Secondary | ICD-10-CM

## 2017-09-21 DIAGNOSIS — M5412 Radiculopathy, cervical region: Secondary | ICD-10-CM | POA: Diagnosis not present

## 2017-09-21 DIAGNOSIS — B009 Herpesviral infection, unspecified: Secondary | ICD-10-CM

## 2017-09-21 DIAGNOSIS — I10 Essential (primary) hypertension: Secondary | ICD-10-CM | POA: Diagnosis not present

## 2017-09-21 DIAGNOSIS — B9689 Other specified bacterial agents as the cause of diseases classified elsewhere: Secondary | ICD-10-CM | POA: Diagnosis not present

## 2017-09-21 MED ORDER — AMLODIPINE BESYLATE 2.5 MG PO TABS: 2.5000 mg | ORAL_TABLET | Freq: Every day | ORAL | 3 refills | Status: DC

## 2017-09-21 MED ORDER — VALACYCLOVIR HCL 500 MG PO TABS: 500.0000 mg | ORAL_TABLET | Freq: Every day | ORAL | 3 refills | Status: DC

## 2017-09-21 NOTE — Patient Instructions (Addendum)
Try mediterranean diet  Please sch mammogram  Please take norvasc 2.5 mg in am  Call back or my chart in 2 weeks  Health Dept for MMR vaccine  Results for Nicole Mendez, Nicole Mendez (MRN [id number]) as of 09/21/2017 14:27  Ref. Range 08/24/2017 09:13  MUMPS ABS, IGG Latest Ref Range: Immune >10.9 AU/mL <9.0 (L)  Rubella Latest Ref Range: Immune >0.99 index 16.90  RUBEOLA AB, IGG Latest Ref Range: Immune >29.9 AU/mL <25.0 (L)   Health dept [phone]   DASH Eating Plan DASH stands for "Dietary Approaches to Stop Hypertension." The DASH eating plan is a healthy eating plan that has been shown to reduce high blood pressure (hypertension). It may also reduce your risk for type 2 diabetes, heart disease, and stroke. The DASH eating plan may also help with weight loss. What are tips for following this plan? General guidelines  Avoid eating more than 2,300 mg (milligrams) of salt (sodium) a day. If you have hypertension, you may need to reduce your sodium intake to 1,500 mg a day.  Limit alcohol intake to no more than 1 drink a day for nonpregnant women and 2 drinks a day for men. One drink equals 12 oz of beer, 5 oz of wine, or 1 oz of hard liquor.  Work with your health care provider to maintain a healthy body weight or to lose weight. Ask what an ideal weight is for you.  Get at least 30 minutes of exercise that causes your heart to beat faster (aerobic exercise) most days of the week. Activities may include walking, swimming, or biking.  Work with your health care provider or diet and nutrition specialist (dietitian) to adjust your eating plan to your individual calorie needs. Reading food labels  Check food labels for the amount of sodium per serving. Choose foods with less than 5 percent of the Daily Value of sodium. Generally, foods with less than 300 mg of sodium per serving fit into this eating plan.  To find whole grains, look for the word "whole" as the first word in the ingredient  list. Shopping  Buy products labeled as "low-sodium" or "no salt added."  Buy fresh foods. Avoid canned foods and premade or frozen meals. Cooking  Avoid adding salt when cooking. Use salt-free seasonings or herbs instead of table salt or sea salt. Check with your health care provider or pharmacist before using salt substitutes.  Do not fry foods. Cook foods using healthy methods such as baking, boiling, grilling, and broiling instead.  Cook with heart-healthy oils, such as olive, canola, soybean, or sunflower oil. Meal planning   Eat a balanced diet that includes: ? 5 or more servings of fruits and vegetables each day. At each meal, try to fill half of your plate with fruits and vegetables. ? Up to 6-8 servings of whole grains each day. ? Less than 6 oz of lean meat, poultry, or fish each day. A 3-oz serving of meat is about the same size as a deck of cards. One egg equals 1 oz. ? 2 servings of low-fat dairy each day. ? A serving of nuts, seeds, or beans 5 times each week. ? Heart-healthy fats. Healthy fats called Omega-3 fatty acids are found in foods such as flaxseeds and coldwater fish, like sardines, salmon, and mackerel.  Limit how much you eat of the following: ? Canned or prepackaged foods. ? Food that is high in trans fat, such as fried foods. ? Food that is high in saturated  fat, such as fatty meat. ? Sweets, desserts, sugary drinks, and other foods with added sugar. ? Full-fat dairy products.  Do not salt foods before eating.  Try to eat at least 2 vegetarian meals each week.  Eat more home-cooked food and less restaurant, buffet, and fast food.  When eating at a restaurant, ask that your food be prepared with less salt or no salt, if possible. What foods are recommended? The items listed may not be a complete list. Talk with your dietitian about what dietary choices are best for you. Grains Whole-grain or whole-wheat bread. Whole-grain or whole-wheat pasta. Brown  rice. [place]. Bulgur. Whole-grain and low-sodium cereals. Pita bread. Low-fat, low-sodium crackers. Whole-wheat flour tortillas. Vegetables Fresh or frozen vegetables (raw, steamed, roasted, or grilled). Low-sodium or reduced-sodium tomato and vegetable juice. Low-sodium or reduced-sodium tomato sauce and tomato paste. Low-sodium or reduced-sodium canned vegetables. Fruits All fresh, dried, or frozen fruit. Canned fruit in natural juice (without added sugar). Meat and other protein foods Skinless chicken or [place]. Ground chicken or [place]. Pork with fat trimmed off. Fish and seafood. Egg whites. Dried beans, peas, or lentils. Unsalted nuts, nut butters, and seeds. Unsalted canned beans. Lean cuts of beef with fat trimmed off. Low-sodium, lean deli meat. Dairy Low-fat (1%) or fat-free (skim) milk. Fat-free, low-fat, or reduced-fat cheeses. Nonfat, low-sodium ricotta or cottage cheese. Low-fat or nonfat yogurt. Low-fat, low-sodium cheese. Fats and oils Soft margarine without trans fats. Vegetable oil. Low-fat, reduced-fat, or light mayonnaise and salad dressings (reduced-sodium). Canola, safflower, olive, soybean, and sunflower oils. Avocado. Seasoning and other foods Herbs. Spices. Seasoning mixes without salt. Unsalted popcorn and pretzels. Fat-free sweets. What foods are not recommended? The items listed may not be a complete list. Talk with your dietitian about what dietary choices are best for you. Grains Baked goods made with fat, such as croissants, muffins, or some breads. Dry pasta or rice meal packs. Vegetables Creamed or fried vegetables. Vegetables in a cheese sauce. Regular canned vegetables (not low-sodium or reduced-sodium). Regular canned tomato sauce and paste (not low-sodium or reduced-sodium). Regular tomato and vegetable juice (not low-sodium or reduced-sodium). Angie Fava. Olives. Fruits Canned fruit in a light or heavy syrup. Fried fruit. Fruit in cream or butter  sauce. Meat and other protein foods Fatty cuts of meat. Ribs. Fried meat. Berniece Salines. Sausage. Bologna and other processed lunch meats. Salami. Fatback. Hotdogs. Bratwurst. Salted nuts and seeds. Canned beans with added salt. Canned or smoked fish. Whole eggs or egg yolks. Chicken or [place] with skin. Dairy Whole or 2% milk, cream, and half-and-half. Whole or full-fat cream cheese. Whole-fat or sweetened yogurt. Full-fat cheese. Nondairy creamers. Whipped toppings. Processed cheese and cheese spreads. Fats and oils Butter. Stick margarine. Lard. Shortening. Ghee. Bacon fat. Tropical oils, such as coconut, palm kernel, or palm oil. Seasoning and other foods Salted popcorn and pretzels. Onion salt, garlic salt, seasoned salt, table salt, and sea salt. Worcestershire sauce. Tartar sauce. Barbecue sauce. Teriyaki sauce. Soy sauce, including reduced-sodium. Steak sauce. Canned and packaged gravies. Fish sauce. Oyster sauce. Cocktail sauce. Horseradish that you find on the shelf. Ketchup. Mustard. Meat flavorings and tenderizers. Bouillon cubes. Hot sauce and Tabasco sauce. Premade or packaged marinades. Premade or packaged taco seasonings. Relishes. Regular salad dressings. Where to find more information:  National Heart, Lung, and [place]: https://wilson-eaton.com/  American Heart Association: www.heart.org Summary  The DASH eating plan is a healthy eating plan that has been shown to reduce high blood pressure (hypertension). It may also reduce  your risk for type 2 diabetes, heart disease, and stroke.  With the DASH eating plan, you should limit salt (sodium) intake to 2,300 mg a day. If you have hypertension, you may need to reduce your sodium intake to 1,500 mg a day.  When on the DASH eating plan, aim to eat more fresh fruits and vegetables, whole grains, lean proteins, low-fat dairy, and heart-healthy fats.  Work with your health care provider or diet and nutrition specialist (dietitian) to adjust  your eating plan to your individual calorie needs. This information is not intended to replace advice given to you by your health care provider. Make sure you discuss any questions you have with your health care provider. Document Released: 03/10/2011 Document Revised: 03/14/2016 Document Reviewed: 03/14/2016 Elsevier Interactive Patient Education  2018 [employer].  Hypertension Hypertension, commonly called high blood pressure, is when the force of blood pumping through the arteries is too strong. The arteries are the blood vessels that carry blood from the heart throughout the body. Hypertension forces the heart to work harder to pump blood and may cause arteries to become narrow or stiff. Having untreated or uncontrolled hypertension can cause heart attacks, strokes, kidney disease, and other problems. A blood pressure reading consists of a higher number over a lower number. Ideally, your blood pressure should be below 120/80. The first ("top") number is called the systolic pressure. It is a measure of the pressure in your arteries as your heart beats. The second ("bottom") number is called the diastolic pressure. It is a measure of the pressure in your arteries as the heart relaxes. What are the causes? The cause of this condition is not known. What increases the risk? Some risk factors for high blood pressure are under your control. Others are not. Factors you can change  Smoking.  Having type 2 diabetes mellitus, high cholesterol, or both.  Not getting enough exercise or physical activity.  Being overweight.  Having too much fat, sugar, calories, or salt (sodium) in your diet.  Drinking too much alcohol. Factors that are difficult or impossible to change  Having chronic kidney disease.  Having a family history of high blood pressure.  Age. Risk increases with age.  Race. You may be at higher risk if you are African-American.  Gender. Men are at higher risk than women  before age 33. After age 25, women are at higher risk than men.  Having obstructive sleep apnea.  Stress. What are the signs or symptoms? Extremely high blood pressure (hypertensive crisis) may cause:  Headache.  Anxiety.  Shortness of breath.  Nosebleed.  Nausea and vomiting.  Severe chest pain.  Jerky movements you cannot control (seizures).  How is this diagnosed? This condition is diagnosed by measuring your blood pressure while you are seated, with your arm resting on a surface. The cuff of the blood pressure monitor will be placed directly against the skin of your upper arm at the level of your heart. It should be measured at least twice using the same arm. Certain conditions can cause a difference in blood pressure between your right and left arms. Certain factors can cause blood pressure readings to be lower or higher than normal (elevated) for a short period of time:  When your blood pressure is higher when you are in a health care provider's office than when you are at home, this is called white coat hypertension. Most people with this condition do not need medicines.  When your blood pressure is higher at  home than when you are in a health care provider's office, this is called masked hypertension. Most people with this condition may need medicines to control blood pressure.  If you have a high blood pressure reading during one visit or you have normal blood pressure with other risk factors:  You may be asked to return on a different day to have your blood pressure checked again.  You may be asked to monitor your blood pressure at home for 1 week or longer.  If you are diagnosed with hypertension, you may have other blood or imaging tests to help your health care provider understand your overall risk for other conditions. How is this treated? This condition is treated by making healthy lifestyle changes, such as eating healthy foods, exercising more, and reducing your  alcohol intake. Your health care provider may prescribe medicine if lifestyle changes are not enough to get your blood pressure under control, and if:  Your systolic blood pressure is above 130.  Your diastolic blood pressure is above 80.  Your personal target blood pressure may vary depending on your medical conditions, your age, and other factors. Follow these instructions at home: Eating and drinking  Eat a diet that is high in fiber and potassium, and low in sodium, added sugar, and fat. An example eating plan is called the DASH (Dietary Approaches to Stop Hypertension) diet. To eat this way: ? Eat plenty of fresh fruits and vegetables. Try to fill half of your plate at each meal with fruits and vegetables. ? Eat whole grains, such as whole wheat pasta, brown rice, or whole grain bread. Fill about one quarter of your plate with whole grains. ? Eat or drink low-fat dairy products, such as skim milk or low-fat yogurt. ? Avoid fatty cuts of meat, processed or cured meats, and poultry with skin. Fill about one quarter of your plate with lean proteins, such as fish, chicken without skin, beans, eggs, and tofu. ? Avoid premade and processed foods. These tend to be higher in sodium, added sugar, and fat.  Reduce your daily sodium intake. Most people with hypertension should eat less than 1,500 mg of sodium a day.  Limit alcohol intake to no more than 1 drink a day for nonpregnant women and 2 drinks a day for men. One drink equals 12 oz of beer, 5 oz of wine, or 1 oz of hard liquor. Lifestyle  Work with your health care provider to maintain a healthy body weight or to lose weight. Ask what an ideal weight is for you.  Get at least 30 minutes of exercise that causes your heart to beat faster (aerobic exercise) most days of the week. Activities may include walking, swimming, or biking.  Include exercise to strengthen your muscles (resistance exercise), such as pilates or lifting weights, as part  of your weekly exercise routine. Try to do these types of exercises for 30 minutes at least 3 days a week.  Do not use any products that contain nicotine or tobacco, such as cigarettes and e-cigarettes. If you need help quitting, ask your health care provider.  Monitor your blood pressure at home as told by your health care provider.  Keep all follow-up visits as told by your health care provider. This is important. Medicines  Take over-the-counter and prescription medicines only as told by your health care provider. Follow directions carefully. Blood pressure medicines must be taken as prescribed.  Do not skip doses of blood pressure medicine. Doing this puts you at  risk for problems and can make the medicine less effective.  Ask your health care provider about side effects or reactions to medicines that you should watch for. Contact a health care provider if:  You think you are having a reaction to a medicine you are taking.  You have headaches that keep coming back (recurring).  You feel dizzy.  You have swelling in your ankles.  You have trouble with your vision. Get help right away if:  You develop a severe headache or confusion.  You have unusual weakness or numbness.  You feel faint.  You have severe pain in your chest or abdomen.  You vomit repeatedly.  You have trouble breathing. Summary  Hypertension is when the force of blood pumping through your arteries is too strong. If this condition is not controlled, it may put you at risk for serious complications.  Your personal target blood pressure may vary depending on your medical conditions, your age, and other factors. For most people, a normal blood pressure is less than 120/80.  Hypertension is treated with lifestyle changes, medicines, or a combination of both. Lifestyle changes include weight loss, eating a healthy, low-sodium diet, exercising more, and limiting alcohol. This information is not intended to  replace advice given to you by your health care provider. Make sure you discuss any questions you have with your health care provider. Document Released: 03/21/2005 Document Revised: 02/17/2016 Document Reviewed: 02/17/2016 Elsevier Interactive Patient Education  2018 [employer].   Exercising to [employer] Exercising can help you to lose weight. In order to lose weight through exercise, you need to do vigorous-intensity exercise. You can tell that you are exercising with vigorous intensity if you are breathing very hard and fast and cannot hold a conversation while exercising. Moderate-intensity exercise helps to maintain your current weight. You can tell that you are exercising at a moderate level if you have a higher heart rate and faster breathing, but you are still able to hold a conversation. How often should I exercise? Choose an activity that you enjoy and set realistic goals. Your health care provider can help you to make an activity plan that works for you. Exercise regularly as directed by your health care provider. This may include:  Doing resistance training twice each week, such as: ? Push-ups. ? Sit-ups. ? Lifting weights. ? Using resistance bands.  Doing a given intensity of exercise for a given amount of time. Choose from these options: ? 150 minutes of moderate-intensity exercise every week. ? 75 minutes of vigorous-intensity exercise every week. ? A mix of moderate-intensity and vigorous-intensity exercise every week.  Children, pregnant women, people who are out of shape, people who are overweight, and older adults may need to consult a health care provider for individual recommendations. If you have any sort of medical condition, be sure to consult your health care provider before starting a new exercise program. What are some activities that can help me to lose weight?  Walking at a rate of at least 4.5 miles an hour.  Jogging or running at a rate of 5 miles per  hour.  Biking at a rate of at least 10 miles per hour.  Lap swimming.  Roller-skating or in-line skating.  Cross-country skiing.  Vigorous competitive sports, such as football, basketball, and soccer.  Jumping rope.  Aerobic dancing. How can I be more active in my day-to-day activities?  Use the stairs instead of the elevator.  Take a walk during your lunch break.  If you drive, park your car farther away from work or school.  If you take public transportation, get off one stop early and walk the rest of the way.  Make all of your phone calls while standing up and walking around.  Get up, stretch, and walk around every 30 minutes throughout the day. What guidelines should I follow while exercising?  Do not exercise so much that you hurt yourself, feel dizzy, or get very short of breath.  Consult your health care provider prior to starting a new exercise program.  Wear comfortable clothes and shoes with good support.  Drink plenty of water while you exercise to prevent dehydration or heat stroke. Body water is lost during exercise and must be replaced.  Work out until you breathe faster and your heart beats faster. This information is not intended to replace advice given to you by your health care provider. Make sure you discuss any questions you have with your health care provider. Document Released: 04/23/2010 Document Revised: 08/27/2015 Document Reviewed: 08/22/2013 Elsevier Interactive Patient Education  [employer].

## 2017-09-21 NOTE — Progress Notes (Signed)
Chief Complaint  Patient presents with  . Follow-up  . Hypertension   F/u  1. HTN impvored lis 40, hctz 25 not controlled has BP at home now  2. Still c/o neck pain pain 4/10 today with numbness/tingling b/l arms and left arm weakness esp with lifting objects. This has been going on x 5-6 years and Xray cervical in past with arthritis deg changes. She does not want to try PT for now due to work sch. Has tried NSAIDS, Tylenol w/o relief but flexeril helps some  3. Still c/o vaginal odor and irritation +BV had flagyl uses [employer]  4. rec MMR vaccine  5. Obesity needs to exercise and eat healthy she wants to hold on nutrition referral 6. 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  HENT: Negative for hearing loss.   Eyes: Negative for blurred vision.  Respiratory: Negative for shortness of breath.   Cardiovascular: Negative for chest pain.  Gastrointestinal: Negative for abdominal pain.  Genitourinary:       +vaginal discharge and odor   Musculoskeletal: Positive for neck pain.  Skin: Negative for rash.  Neurological: Positive for sensory change and weakness.  Psychiatric/Behavioral: The patient is not nervous/anxious.    Past Medical History:  Diagnosis Date  . Alcohol abuse   . Arthritis   . Cervical radiculopathy   . Diabetes mellitus without complication ([place]) 4401  . Hyperlipidemia   . Hypertension   . Morbid obesity ([place])   . OSA (obstructive sleep apnea)    Not on CPAP  . Psoriasis   . Tobacco abuse    No past surgical history on file. Family History  Problem Relation Age of Onset  . Alcohol abuse Maternal Grandmother   . Cancer Maternal Grandmother        Ovarian  . Hypertension Maternal Grandmother   . Diabetes Maternal Grandmother   . Hypertension Mother   . Diabetes Mother   . Colon  cancer Other 14   Social History   Socioeconomic History  . Marital status: Single    Spouse name: Not on file  . Number of children: 0  . Years of education: Not on file  . Highest education level: Not on file  Occupational History  . Not on file  Social Needs  . Financial resource strain: Not on file  . Food insecurity:    Worry: Not on file    Inability: Not on file  . Transportation needs:    Medical: Not on file    Non-medical: Not on file  Tobacco Use  . Smoking status: Former Smoker    Packs/day: 0.50    Types: Cigarettes  . Smokeless tobacco: Never Used  . Tobacco comment: stopped 11/2015  Substance and Sexual Activity  . Alcohol use: Yes    Alcohol/week: 0.0 oz    Comment: occasionally  . Drug use: No  . Sexual activity: Yes    Partners: Male    Birth control/protection: None    Comment: 1 partner  Lifestyle  . Physical activity:    Days per week: Not on file    Minutes per session: Not on file  . Stress: Not on file  Relationships  . Social connections:    Talks on phone: Not on file    Gets together:  Not on file    Attends religious service: Not on file    Active member of club or organization: Not on file    Attends meetings of clubs or organizations: Not on file    Relationship status: Not on file  . Intimate partner violence:    Fear of current or ex partner: Not on file    Emotionally abused: Not on file    Physically abused: Not on file    Forced sexual activity: Not on file  Other Topics Concern  . 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    Associates Degree   No children    No caffeine   Current Meds  Medication Sig  . acetaminophen (TYLENOL) 650 MG CR tablet Take 650 mg by mouth every 8 (eight) hours as needed for pain.  Marland Kitchen aspirin 81 MG tablet Take 81 mg by mouth daily.  Marland Kitchen aspirin-acetaminophen-caffeine (EXCEDRIN MIGRAINE) 250-250-65 MG tablet Take 1 tablet by mouth  every 6 (six) hours as needed for headache.  . Cholecalciferol 50000 units capsule Take 1 capsule (50,000 Units total) by mouth once a week.  . clotrimazole-betamethasone (LOTRISONE) cream Apply 1 application topically 2 (two) times daily.  . cyclobenzaprine (FLEXERIL) 5 MG tablet Take 1 tablet (5 mg total) by mouth daily as needed for muscle spasms.  . hydrochlorothiazide (HYDRODIURIL) 25 MG tablet Take 1 tablet (25 mg total) by mouth daily. 1/2 pill x 3 days then 1 tablet in am  . lisinopril (PRINIVIL,ZESTRIL) 40 MG tablet Take 1 tablet (40 mg total) by mouth daily.  . metFORMIN (GLUCOPHAGE) 500 MG tablet TAKE 1 TABLET BY MOUTH TWO  TIMES DAILY WITH MEALS  . metroNIDAZOLE (FLAGYL) 500 MG tablet Take 1 tablet (500 mg total) by mouth 2 (two) times daily. With food  . pravastatin (PRAVACHOL) 40 MG tablet Take 1 tablet (40 mg total) by mouth daily.  . valACYclovir (VALTREX) 500 MG tablet TAKE 1 TABLET BY MOUTH ONCE DAILY   No Known Allergies Recent Results (from the past 2160 hour(s))  Cytology - PAP     Status: Abnormal   Collection Time: 08/24/17 12:00 AM  Result Value Ref Range   Adequacy      Satisfactory for evaluation  endocervical/transformation zone component PRESENT.   Diagnosis      NEGATIVE FOR INTRAEPITHELIAL LESIONS OR MALIGNANCY.   Bacterial vaginitis POSITIVE for Gardnerella vaginalis (A)     Comment: Normal Reference Range - Negative   Candida vaginitis Negative for Candida species     Comment: Normal Reference Range - Negative   Chlamydia Negative     Comment: Normal Reference Range - Negative   Neisseria gonorrhea Negative     Comment: Normal Reference Range - Negative   Trichomonas Negative     Comment: Normal Reference Range - Negative   HPV NOT DETECTED     Comment: Normal Reference Range - NOT Detected   Material Submitted CervicoVaginal Pap [ThinPrep Imaged]    CYTOLOGY - PAP PAP RESULT   Comprehensive metabolic panel     Status: Abnormal   Collection Time:  08/24/17  9:13 AM  Result Value Ref Range   Glucose 98 65 - 99 mg/dL   BUN 7 6 - 24 mg/dL   Creatinine, Ser 0.47 (L) 0.57 - 1.00 mg/dL   GFR calc non Af Amer 121 >59 mL/min/1.73   GFR calc Af Amer 140 >59 mL/min/1.73  BUN/Creatinine Ratio 15 9 - 23   Sodium 143 134 - 144 mmol/L   Potassium 4.1 3.5 - 5.2 mmol/L   Chloride 102 96 - 106 mmol/L   CO2 25 20 - 29 mmol/L   Calcium 9.0 8.7 - 10.2 mg/dL   Total Protein 7.3 6.0 - 8.5 g/dL   Albumin 4.2 3.5 - 5.5 g/dL   Globulin, Total 3.1 1.5 - 4.5 g/dL   Albumin/Globulin Ratio 1.4 1.2 - 2.2   Bilirubin Total 0.2 0.0 - 1.2 mg/dL   Alkaline Phosphatase 98 39 - 117 IU/L   AST 15 0 - 40 IU/L   ALT 13 0 - 32 IU/L  CBC with Differential/Platelet     Status: None   Collection Time: 08/24/17  9:13 AM  Result Value Ref Range   WBC CANCELED x10E3/uL    Comment: Please refer to the following specimen for additional lab results. see [phone]  Result canceled by the ancillary.   Lipid panel     Status: None   Collection Time: 08/24/17  9:13 AM  Result Value Ref Range   Cholesterol, Total 147 100 - 199 mg/dL   Triglycerides 71 0 - 149 mg/dL   HDL 56 >39 mg/dL   VLDL Cholesterol Cal 14 5 - 40 mg/dL   LDL Calculated 77 0 - 99 mg/dL   Chol/HDL Ratio 2.6 0.0 - 4.4 ratio    Comment:                                   T. Chol/HDL Ratio                                             Men  Women                               1/2 Avg.Risk  3.4    3.3                                   Avg.Risk  5.0    4.4                                2X Avg.Risk  9.6    7.1                                3X Avg.Risk 23.4   11.0   Iron, TIBC and Ferritin Panel     Status: Abnormal   Collection Time: 08/24/17  9:13 AM  Result Value Ref Range   Total Iron Binding Capacity 426 250 - 450 ug/dL   UIBC 378 131 - 425 ug/dL   Iron 48 27 - 159 ug/dL   Iron Saturation 11 (L) 15 - 55 %   Ferritin 36 15 - 150 ng/mL  T4, free     Status: None   Collection Time: 08/24/17   9:13 AM  Result Value Ref Range   Free T4 1.15 0.82 - 1.77 ng/dL  TSH     Status: None   Collection Time: 08/24/17  9:13 AM  Result Value Ref Range   TSH 2.260 0.450 - 4.500 uIU/mL  Urinalysis, Routine w reflex microscopic     Status: None   Collection Time: 08/24/17  9:13 AM  Result Value Ref Range   Specific Gravity, UA 1.015 1.005 - 1.030   pH, UA 7.0 5.0 - 7.5   Color, UA Yellow Yellow   Appearance Ur Clear Clear   Leukocytes, UA Negative Negative   Protein, UA Negative Negative/Trace   Glucose, UA Negative Negative   Ketones, UA Negative Negative   RBC, UA Negative Negative   Bilirubin, UA Negative Negative   Urobilinogen, Ur 0.2 0.2 - 1.0 mg/dL   Nitrite, UA Negative Negative   Microscopic Examination Comment     Comment: Microscopic not indicated and not performed.  Vitamin D (25 hydroxy)     Status: Abnormal   Collection Time: 08/24/17  9:13 AM  Result Value Ref Range   Vit D, 25-Hydroxy 19.5 (L) 30.0 - 100.0 ng/mL    Comment: Vitamin D deficiency has been defined by the [place] practice guideline as a level of serum 25-OH vitamin D less than 20 ng/mL (1,2). The Endocrine Society went on to further define vitamin D insufficiency as a level between 21 and 29 ng/mL (2). 1. IOM (Institute of Medicine). 2010. Dietary reference    intakes for calcium and D. [place]: The    [employer]. 2. Holick MF, Binkley Eugenio Saenz, Bischoff-Ferrari HA, et al.    Evaluation, treatment, and prevention of vitamin D    deficiency: an Endocrine Society clinical practice    guideline. JCEM. 2011 Jul; 96(7):1911-30.   Measles/Mumps/Rubella Immunity     Status: Abnormal   Collection Time: 08/24/17  9:13 AM  Result Value Ref Range   Rubella Antibodies, IGG 16.90 Immune >0.99 index    Comment:                                 Non-immune       <0.90                                 Equivocal  0.90 - 0.99                                 Immune            >0.99    RUBEOLA AB, IGG <25.0 (L) Immune >29.9 AU/mL    Comment:                                  Negative        <25.0                                  Equivocal 25.0 - 29.9                                  Positive        >29.9 Presence of antibodies to Rubeola is presumptive evidence of immunity except when acute infection is suspected.    MUMPS  ABS, IGG <9.0 (L) Immune >10.9 AU/mL    Comment:                                 Negative         <9.0                                 Equivocal  9.0 - 10.9                                 Positive        >10.9 A positive result generally indicates past exposure to Mumps virus or previous vaccination.   RPR     Status: None   Collection Time: 08/24/17  9:29 AM  Result Value Ref Range   RPR Ser Ql Non Reactive Non Reactive  HIV antibody (with reflex)     Status: None   Collection Time: 08/24/17  9:29 AM  Result Value Ref Range   HIV Screen 4th Generation wRfx Non Reactive Non Reactive  HSV 1 antibody, IgG     Status: Abnormal   Collection Time: 08/24/17  9:29 AM  Result Value Ref Range   HSV 1 Glycoprotein G Ab, IgG 0.96 (H) 0.00 - 0.90 index    Comment: A second sample should be collected and tested no less than 2-4 weeks.                                  Negative        <0.91                                  Equivocal 0.91 - 1.09                                  Positive        >1.09  Note: Negative indicates no antibodies detected to  HSV-1. Equivocal may suggest early infection.  If  clinically appropriate, retest at later date. Positive  indicates antibodies detected to HSV-1.   HSV 2 antibody, IgG     Status: Abnormal   Collection Time: 08/24/17  9:29 AM  Result Value Ref Range   HSV 2 IgG, Type Spec 15.50 (H) 0.00 - 0.90 index    Comment:                                  Negative        <0.91                                  Equivocal 0.91 - 1.09                                  Positive        >1.09  Note:  Negative indicates no antibodies detected to  HSV-2. Equivocal may suggest early infection.  If  clinically  appropriate, retest at later date. Positive  indicates antibodies detected to HSV-2.   Hemoglobin A1c     Status: Abnormal   Collection Time: 08/24/17  1:08 PM  Result Value Ref Range   Hgb A1c MFr Bld 6.7 (H) 4.8 - 5.6 %    Comment:          Prediabetes: 5.7 - 6.4          Diabetes: >6.4          Glycemic control for adults with diabetes: <7.0    Est. average glucose Bld gHb Est-mCnc 146 mg/dL  CBC with Differential/Platelet     Status: Abnormal   Collection Time: 08/24/17  1:08 PM  Result Value Ref Range   WBC 6.6 3.4 - 10.8 x10E3/uL   RBC 4.60 3.77 - 5.28 x10E6/uL   Hemoglobin 11.8 11.1 - 15.9 g/dL   Hematocrit 37.4 34.0 - 46.6 %   MCV 81 79 - 97 fL   MCH 25.7 (L) 26.6 - 33.0 pg   MCHC 31.6 31.5 - 35.7 g/dL   RDW 15.4 12.3 - 15.4 %   Platelets 470 (H) 150 - 450 x10E3/uL    Comment:               **Please note reference interval change**   Neutrophils 50 Not Estab. %   Lymphs 37 Not Estab. %   Monocytes 9 Not Estab. %   Eos 3 Not Estab. %   Basos 1 Not Estab. %   Neutrophils Absolute 3.4 1.4 - 7.0 x10E3/uL   Lymphocytes Absolute 2.5 0.7 - 3.1 x10E3/uL   Monocytes Absolute 0.6 0.1 - 0.9 x10E3/uL   EOS (ABSOLUTE) 0.2 0.0 - 0.4 x10E3/uL   Basophils Absolute 0.0 0.0 - 0.2 x10E3/uL   Immature Granulocytes 0 Not Estab. %   Immature Grans (Abs) 0.0 0.0 - 0.1 x10E3/uL  Specimen status report     Status: None   Collection Time: 08/24/17  1:08 PM  Result Value Ref Range   specimen status report Comment     Comment: Written Authorization Written Authorization Written Authorization Received. Authorization received from [place] 08-25-2017 Logged by Sammie Bench    Objective  Body mass index is 55.03 kg/m. Wt Readings from Last 3 Encounters:  09/21/17 (!) 320 lb 9.6 oz (145.4 kg)  08/24/17 (!) 316 lb 12.8 oz (143.7 kg)  03/20/17 (!) 316 lb 2 oz (143.4 kg)    Temp Readings from Last 3 Encounters:  09/21/17 98.3 F (36.8 C) (Oral)  08/24/17 98.4 F (36.9 C) (Oral)  03/20/17 98.2 F (36.8 C) (Oral)   BP Readings from Last 3 Encounters:  09/21/17 (!) 146/94  08/24/17 (!) 176/100  03/20/17 (!) 180/86   Pulse Readings from Last 3 Encounters:  09/21/17 (!) 103  08/24/17 (!) 105  03/20/17 (!) 105    Physical Exam  Constitutional: She is oriented to person, place, and time. She appears well-developed and well-nourished. She is cooperative.  HENT:  Head: Normocephalic and atraumatic.  Mouth/Throat: Oropharynx is clear and moist and mucous membranes are normal.  Eyes: Pupils are equal, round, and reactive to light. Conjunctivae are normal.  Cardiovascular: Regular rhythm and normal heart sounds. Tachycardia present.  Pulmonary/Chest: Effort normal and breath sounds normal.  Neurological: She is alert and oriented to person, place, and time. Gait normal.  Skin: Skin is warm, dry and intact.  Psychiatric: She has a normal mood and affect. Her speech is normal and behavior is normal. Judgment and thought  content normal. Cognition and memory are normal.  Nursing note and vitals reviewed.   Assessment   1. HTN 2. DM 2 A1C 6.7  3. Chronic neck pian with cervical radiculopathy and left hand weakness  4. Vaginitis  5. Obesity bmi 55.03  6. 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 Cont meds  Add norvasc 2.5 mg qd  2.  Cont meds  Ask about eye exam in future  3. Will try to get MRI C approved open in [place] due to sxs in future  4. Repeat urine  5. Consider Saxenda in future  Hold nutrition referral for now  rec healthy diet choices and exercise  6. Valtrex  7.  Had flu shot  Tdap had  pna 23 had  Referred mammo rec sch  rec healthy diet and exercise  rec MMR vx health dept    Provider: Dr. Olivia Mackie McLean-Scocuzza-Internal Medicine

## 2017-09-21 NOTE — Progress Notes (Signed)
Pre visit review using our clinic review tool, if applicable. No additional management support is needed unless otherwise documented below in the visit note. 

## 2017-09-22 ENCOUNTER — Telehealth: Payer: Self-pay | Admitting: *Deleted

## 2017-09-22 NOTE — Telephone Encounter (Signed)
Spoke with cytology Ranelle OysterDiana H. And she said order corrected.

## 2017-09-22 NOTE — Telephone Encounter (Signed)
Copied from CRM [phone]. Topic: Inquiry >> Sep 22, 2017  3:45 PM Crist InfanteHarrald, Kathy J wrote: Reason for CRM: [facility] calling to verify which urine cytology test dr was asking for? (Urine cytology ancillary order) Need 6 digit [facility] test number.  Need a more exact test clarification. Pt seen 6/20 by Dr Kennith Centerracey

## 2017-09-22 NOTE — Telephone Encounter (Signed)
Urine cytology for BV  Please call now

## 2017-09-24 ENCOUNTER — Other Ambulatory Visit: Payer: Managed Care, Other (non HMO)

## 2017-09-25 ENCOUNTER — Other Ambulatory Visit: Payer: Self-pay | Admitting: Internal Medicine

## 2017-09-25 LAB — SPECIMEN STATUS REPORT

## 2017-09-26 LAB — CYTOLOGY, URINE

## 2017-09-29 LAB — SPECIMEN STATUS REPORT

## 2017-10-02 ENCOUNTER — Telehealth: Payer: Self-pay | Admitting: Internal Medicine

## 2017-10-02 NOTE — Telephone Encounter (Signed)
FYI

## 2017-10-02 NOTE — Telephone Encounter (Signed)
Copied from CRM [id number]. Topic: Quick Communication - See Telephone Encounter >> Oct 02, 2017  2:37 PM Cipriano BunkerLambe, Annette S wrote: CRM for notification. See Telephone encounter for: 10/02/17.  She called saying ins. Won't pay for MRI and she is in a lot of neck pain.   This is documentation for ins.

## 2017-10-04 ENCOUNTER — Telehealth: Payer: Self-pay | Admitting: Internal Medicine

## 2017-10-04 ENCOUNTER — Other Ambulatory Visit: Payer: Self-pay | Admitting: Internal Medicine

## 2017-10-04 DIAGNOSIS — B9689 Other specified bacterial agents as the cause of diseases classified elsewhere: Secondary | ICD-10-CM

## 2017-10-04 DIAGNOSIS — N760 Acute vaginitis: Principal | ICD-10-CM

## 2017-10-04 LAB — SPECIMEN STATUS REPORT

## 2017-10-04 NOTE — Telephone Encounter (Signed)
Call pt sch lab visit to leave new urine sample here will [facility] resulted the wrong test I rather she have it here   TMS

## 2017-10-04 NOTE — Telephone Encounter (Signed)
Mychart message has been sent to inform patinet.

## 2017-10-12 ENCOUNTER — Ambulatory Visit
Admission: RE | Admit: 2017-10-12 | Discharge: 2017-10-12 | Disposition: A | Discharge status: Home / Self Care | Payer: Managed Care, Other (non HMO) | Source: Ambulatory Visit | Attending: Internal Medicine | Admitting: Internal Medicine

## 2017-10-12 DIAGNOSIS — Z1231 Encounter for screening mammogram for malignant neoplasm of breast: Secondary | ICD-10-CM | POA: Insufficient documentation

## 2017-10-24 ENCOUNTER — Inpatient Hospital Stay
Admission: RE | Admit: 2017-10-24 | Discharge: 2017-10-24 | Disposition: A | Discharge status: Home / Self Care | Payer: Self-pay | Source: Ambulatory Visit | Attending: *Deleted | Admitting: *Deleted

## 2017-10-24 ENCOUNTER — Other Ambulatory Visit: Payer: Self-pay | Admitting: *Deleted

## 2017-10-24 DIAGNOSIS — Z9289 Personal history of other medical treatment: Secondary | ICD-10-CM

## 2017-12-15 ENCOUNTER — Other Ambulatory Visit: Payer: Self-pay | Admitting: Internal Medicine

## 2017-12-15 ENCOUNTER — Encounter: Payer: Self-pay | Admitting: Internal Medicine

## 2017-12-15 ENCOUNTER — Telehealth: Payer: Self-pay | Admitting: Internal Medicine

## 2017-12-15 ENCOUNTER — Ambulatory Visit: Payer: Managed Care, Other (non HMO) | Admitting: Internal Medicine

## 2017-12-15 ENCOUNTER — Ambulatory Visit: Payer: Self-pay | Admitting: *Deleted

## 2017-12-15 VITALS — BP 146/82 | HR 94 | Temp 98.2°F | Ht 64.0 in | Wt 322.6 lb

## 2017-12-15 DIAGNOSIS — I10 Essential (primary) hypertension: Secondary | ICD-10-CM | POA: Diagnosis not present

## 2017-12-15 DIAGNOSIS — B9689 Other specified bacterial agents as the cause of diseases classified elsewhere: Secondary | ICD-10-CM

## 2017-12-15 DIAGNOSIS — N760 Acute vaginitis: Secondary | ICD-10-CM

## 2017-12-15 DIAGNOSIS — E785 Hyperlipidemia, unspecified: Secondary | ICD-10-CM

## 2017-12-15 DIAGNOSIS — E119 Type 2 diabetes mellitus without complications: Secondary | ICD-10-CM | POA: Diagnosis not present

## 2017-12-15 LAB — POCT GLYCOSYLATED HEMOGLOBIN (HGB A1C): Hemoglobin A1C: 6.2 % — AB (ref 4.0–5.6)

## 2017-12-15 MED ORDER — AMLODIPINE BESYLATE 5 MG PO TABS: 5.0000 mg | ORAL_TABLET | Freq: Every day | ORAL | 3 refills | Status: DC

## 2017-12-15 MED ORDER — METRONIDAZOLE 500 MG PO TABS: 500.0000 mg | ORAL_TABLET | Freq: Two times a day (BID) | ORAL | 0 refills | Status: DC

## 2017-12-15 NOTE — Progress Notes (Signed)
Pre visit review using our clinic review tool, if applicable. No additional management support is needed unless otherwise documented below in the visit note. 

## 2017-12-15 NOTE — Patient Instructions (Addendum)
Results for Nicole Mendez, Nicole Mendez (MRN [id number]) as of 12/15/2017 14:10  Ref. Range 08/24/2017 09:13  MUMPS ABS, IGG Latest Ref Range: Immune >10.9 AU/mL <9.0 (L)  Rubella Latest Ref Range: Immune >0.99 index 16.90  RUBEOLA AB, IGG Latest Ref Range: Immune >29.9 AU/mL <25.0 (L)   Please take weekly D3 until 02/2018 then 5000 IU D3 over the counter daily     Bacterial Vaginosis Bacterial vaginosis is a vaginal infection that occurs when the normal balance of bacteria in the vagina is disrupted. It results from an overgrowth of certain bacteria. This is the most common vaginal infection among women ages 78-44. Because bacterial vaginosis increases your risk for STIs (sexually transmitted infections), getting treated can help reduce your risk for chlamydia, gonorrhea, herpes, and HIV (human immunodeficiency virus). Treatment is also important for preventing complications in pregnant women, because this condition can cause an early (premature) delivery. What are the causes? This condition is caused by an increase in harmful bacteria that are normally present in small amounts in the vagina. However, the reason that the condition develops is not fully understood. What increases the risk? The following factors may make you more likely to develop this condition:  Having a new sexual partner or multiple sexual partners.  Having unprotected sex.  Douching.  Having an intrauterine device (IUD).  Smoking.  Drug and alcohol abuse.  Taking certain antibiotic medicines.  Being pregnant.  You cannot get bacterial vaginosis from toilet seats, bedding, swimming pools, or contact with objects around you. What are the signs or symptoms? Symptoms of this condition include:  Grey or white vaginal discharge. The discharge can also be watery or foamy.  A fish-like odor with discharge, especially after sexual intercourse or during menstruation.  Itching in and around the vagina.  Burning or pain  with urination.  Some women with bacterial vaginosis have no signs or symptoms. How is this diagnosed? This condition is diagnosed based on:  Your medical history.  A physical exam of the vagina.  Testing a sample of vaginal fluid under a microscope to look for a large amount of bad bacteria or abnormal cells. Your health care provider may use a cotton swab or a small wooden spatula to collect the sample.  How is this treated? This condition is treated with antibiotics. These may be given as a pill, a vaginal cream, or a medicine that is put into the vagina (suppository). If the condition comes back after treatment, a second round of antibiotics may be needed. Follow these instructions at home: Medicines  Take over-the-counter and prescription medicines only as told by your health care provider.  Take or use your antibiotic as told by your health care provider. Do not stop taking or using the antibiotic even if you start to feel better. General instructions  If you have a female sexual partner, tell her that you have a vaginal infection. She should see her health care provider and be treated if she has symptoms. If you have a female sexual partner, he does not need treatment.  During treatment: ? Avoid sexual activity until you finish treatment. ? Do not douche. ? Avoid alcohol as directed by your health care provider. ? Avoid breastfeeding as directed by your health care provider.  Drink enough water and fluids to keep your urine clear or pale yellow.  Keep the area around your vagina and rectum clean. ? Wash the area daily with warm water. ? Wipe yourself from front to back after using the  toilet.  Keep all follow-up visits as told by your health care provider. This is important. How is this prevented?  Do not douche.  Wash the outside of your vagina with warm water only.  Use protection when having sex. This includes latex condoms and dental dams.  Limit how many sexual  partners you have. To help prevent bacterial vaginosis, it is best to have sex with just one partner (monogamous).  Make sure you and your sexual partner are tested for STIs.  Wear cotton or cotton-lined underwear.  Avoid wearing tight pants and pantyhose, especially during summer.  Limit the amount of alcohol that you drink.  Do not use any products that contain nicotine or tobacco, such as cigarettes and e-cigarettes. If you need help quitting, ask your health care provider.  Do not use illegal drugs. Where to find more information:  Centers for Disease Control and Prevention: SolutionApps.co.zawww.cdc.gov/std  American Sexual Health Association (ASHA): www.ashastd.org  U.S. Department of Health and Health and safety inspectorHuman Services, Office on Women's Health: ConventionalMedicines.siwww.womenshealth.gov/ or http://www.anderson-[place].info/https://www.womenshealth.gov/a-z-topics/bacterial-vaginosis Contact a health care provider if:  Your symptoms do not improve, even after treatment.  You have more discharge or pain when urinating.  You have a fever.  You have pain in your abdomen.  You have pain during sex.  You have vaginal bleeding between periods. Summary  Bacterial vaginosis is a vaginal infection that occurs when the normal balance of bacteria in the vagina is disrupted.  Because bacterial vaginosis increases your risk for STIs (sexually transmitted infections), getting treated can help reduce your risk for chlamydia, gonorrhea, herpes, and HIV (human immunodeficiency virus). Treatment is also important for preventing complications in pregnant women, because the condition can cause an early (premature) delivery.  This condition is treated with antibiotic medicines. These may be given as a pill, a vaginal cream, or a medicine that is put into the vagina (suppository). This information is not intended to replace advice given to you by your health care provider. Make sure you discuss any questions you have with your health care provider. Document Released:  03/21/2005 Document Revised: 07/25/2016 Document Reviewed: 12/05/2015 Elsevier Interactive Patient Education  [employer]2018 [employer]

## 2017-12-15 NOTE — Telephone Encounter (Signed)
Pt states she has been experiencing a clear vaginal discharge that is painful, malodorous and itchy for the past 2 weeks. Pt describes the pain as a throbbing type of pain she experiences with and without urinating and states that she does have redness in her vaginal area.Pt states that her urine is cloudy. Pt also has complaints of a cramping type of abdominal pain that comes and goes.  Pt originally calling to request a refill of antibiotic and pt notified that appt was needed for evaluation of current symptoms. Pt verbalized understanding and appt scheduled for today at 2:00pm. Pt verbalized understanding.   Reason for Disposition . Bad smelling vaginal discharge  Answer Assessment - Initial Assessment Questions 1. DISCHARGE: "Describe the discharge." (e.g., white, yellow, green, gray, foamy, cottage cheese-like)     clear 2. ODOR: "Is there a bad odor?"     yes 3. ONSET: "When did the discharge begin?"     2 weeks ago 4. RASH: "Is there a rash in that area?" If so, ask: "Describe it." (e.g., redness, blisters, sores, bumps)     redness 5. ABDOMINAL PAIN: "Are you having any abdominal pain?" If yes: "What does it feel like? " (e.g., crampy, dull, intermittent, constant)    Yes, comes and goes cramping 6. ABDOMINAL PAIN SEVERITY: If present, ask: "How bad is it?"  (e.g., mild, moderate, severe)  - MILD - doesn't interfere with normal activities   - MODERATE - interferes with normal activities or awakens from sleep   - SEVERE - patient doesn't want to move (R/O peritonitis)      Moderate, rates pain at 5-6 7. CAUSE: "What do you think is causing the discharge?" "Have you had the same problem before? What happened then?"     Has happened before, pt states it is the same symptoms she usually has  8. OTHER SYMPTOMS: "Do you have any other symptoms?" (e.g., fever, itching, vaginal bleeding, pain with urination, injury to genital area, vaginal foreign body)     Itching and pain with urination 9.  PREGNANCY: "Is there any chance you are pregnant?" "When was your last menstrual period?"     LMP-3 weeks ago,No chance of  pregnancy  Protocols used: VAGINAL DISCHARGE-A-AH

## 2017-12-15 NOTE — Telephone Encounter (Signed)
Copied from CRM [phone]. Topic: Quick Communication - Rx Refill/Question >> Dec 15, 2017 11:11 AM Alexander BergeronBarksdale, Harvey B wrote: Medication: metroNIDAZOLE (FLAGYL) 500 MG tablet [[id number]][[id number]]   Has the patient contacted their pharmacy? Yes.   (Agent: If no, request that the patient contact the pharmacy for the refill.) (Agent: If yes, when and what did the pharmacy advise?)  Preferred Pharmacy (with phone number or street name): [facility]  Agent: Please be advised that RX refills may take up to 3 business days. We ask that you follow-up with your pharmacy.

## 2017-12-15 NOTE — Addendum Note (Signed)
Addended by: Quentin OreMCLEAN-SCOCUZZA, TRACY on: 12/15/2017 02:38 PM   Modules accepted: Orders

## 2017-12-15 NOTE — Progress Notes (Signed)
Chief Complaint  Patient presents with  . Vaginal Discharge   F/u  1. HTN uncontrolled on norvasc 2.5, lis 40 mg, hctz 25 mg qd  2. Reason for visit thinks she has bv has vaginal discharge urine cloudy with odor and urgency h/o BV  3. DM 2 A1C 6.7 on metformin had eye exam [facility]    Review of Systems  Constitutional: Negative for weight loss.  HENT: Negative for hearing loss.   Eyes: Negative for blurred vision.  Respiratory: Negative for shortness of breath.   Cardiovascular: Negative for chest pain.  Genitourinary:       +vaginal discharge   Musculoskeletal: Negative for back pain.  Skin: Negative for rash.  Neurological: Negative for headaches.  Psychiatric/Behavioral: Negative for depression.   Past Medical History:  Diagnosis Date  . Alcohol abuse   . Arthritis   . Cervical radiculopathy   . Diabetes mellitus without complication ([place]) 0240  . Herpes    1 and 2 since 42 y.o   . Hyperlipidemia   . Hypertension   . Morbid obesity ([place])   . OSA (obstructive sleep apnea)    Not on CPAP  . Psoriasis   . Tobacco abuse    No past surgical history on file. Family History  Problem Relation Age of Onset  . Alcohol abuse Maternal Grandmother   . Cancer Maternal Grandmother        Ovarian  . Hypertension Maternal Grandmother   . Diabetes Maternal Grandmother   . Hypertension Mother   . Diabetes Mother   . Colon cancer Other 48  . Breast cancer Neg Hx    Social History   Socioeconomic History  . Marital status: Single    Spouse name: Not on file  . Number of children: 0  . Years of education: Not on file  . Highest education level: Not on file  Occupational History  . Not on file  Social Needs  . Financial resource strain: Not on file  . Food insecurity:    Worry: Not on file    Inability: Not on file  . Transportation needs:    Medical: Not on file    Non-medical: Not on file  Tobacco Use  . Smoking status: Former Smoker    Packs/day: 0.50   Types: Cigarettes  . Smokeless tobacco: Never Used  . Tobacco comment: stopped 11/2015  Substance and Sexual Activity  . Alcohol use: Yes    Alcohol/week: 0.0 standard drinks    Comment: occasionally  . Drug use: No  . Sexual activity: Yes    Partners: Male    Birth control/protection: None    Comment: 1 partner  Lifestyle  . Physical activity:    Days per week: Not on file    Minutes per session: Not on file  . Stress: Not on file  Relationships  . Social connections:    Talks on phone: Not on file    Gets together: Not on file    Attends religious service: Not on file    Active member of club or organization: Not on file    Attends meetings of clubs or organizations: Not on file    Relationship status: Not on file  . Intimate partner violence:    Fear of current or ex partner: Not on file    Emotionally abused: Not on file    Physically abused: Not on file    Forced sexual activity: Not on file  Other Topics Concern  . 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    Associates Degree   No children    No caffeine   Current Meds  Medication Sig  . acetaminophen (TYLENOL) 650 MG CR tablet Take 650 mg by mouth every 8 (eight) hours as needed for pain.  Marland Kitchen amLODipine (NORVASC) 2.5 MG tablet Take 1 tablet (2.5 mg total) by mouth daily.  Marland Kitchen aspirin 81 MG tablet Take 81 mg by mouth daily.  Marland Kitchen aspirin-acetaminophen-caffeine (EXCEDRIN MIGRAINE) 250-250-65 MG tablet Take 1 tablet by mouth every 6 (six) hours as needed for headache.  . Cholecalciferol 50000 units capsule Take 1 capsule (50,000 Units total) by mouth once a week.  . clotrimazole-betamethasone (LOTRISONE) cream Apply 1 application topically 2 (two) times daily.  . cyclobenzaprine (FLEXERIL) 5 MG tablet Take 1 tablet (5 mg total) by mouth daily as needed for muscle spasms.  . hydrochlorothiazide (HYDRODIURIL) 25 MG tablet Take 1 tablet (25 mg total) by mouth  daily. 1/2 pill x 3 days then 1 tablet in am  . lisinopril (PRINIVIL,ZESTRIL) 40 MG tablet Take 1 tablet (40 mg total) by mouth daily.  . metFORMIN (GLUCOPHAGE) 500 MG tablet TAKE 1 TABLET BY MOUTH TWO  TIMES DAILY WITH MEALS  . metroNIDAZOLE (FLAGYL) 500 MG tablet Take 1 tablet (500 mg total) by mouth 2 (two) times daily. With food  . pravastatin (PRAVACHOL) 40 MG tablet Take 1 tablet (40 mg total) by mouth daily.  . valACYclovir (VALTREX) 500 MG tablet Take 1 tablet (500 mg total) by mouth daily. Bid x 5 days prn  . [DISCONTINUED] metroNIDAZOLE (FLAGYL) 500 MG tablet Take 1 tablet (500 mg total) by mouth 2 (two) times daily. With food   No Known Allergies Recent Results (from the past 2160 hour(s))  Specimen status report     Status: None   Collection Time: 09/21/17 12:00 AM  Result Value Ref Range   specimen status report Comment     Comment: Please note Please note The date and/or time of collection was not indicated on the requisition as required by state and federal law.  The date of receipt of the specimen was used as the collection date if not supplied.   Specimen status report     Status: None   Collection Time: 09/21/17 12:00 AM  Result Value Ref Range   specimen status report Comment     Comment: Verbal Order See below: Comment: Please provide requested information and fax to [phone] or [phone]. The [place] Code of [employer] requires a written and signed request be forwarded to a laboratory following a verbal order of a laboratory test.  Please assist [place] to meet this requirement and to complete our records. Date:______________________________ ICD-9/10 Diagnosis Code(s):___________________________________________ Physician or Authorized Designee:_____________________________________                                               Please Print Physician or Authorized Designee  Signature: ______________________________________________________________________ Your Animal nutritionist Your Order Of The Test(s) Listed Additional Test(s) Requested Comment: Test(s) added per Kerin Salen at account 09-22-2017 Logged by Dalbert Batman Test# [id number] Urine Cytology Verbal Order This is the second notice requesting this information. An IMM EDIATE response is needed.   Specimen status report     Status: None  Collection Time: 09/21/17 12:00 AM  Result Value Ref Range   specimen status report Comment     Comment: Written Authorization Written Authorization Written Authorization Received. Authorization received from [facility] 10-04-2017 Logged by Shannon   Cytology, urine     Status: None   Collection Time: 09/25/17 12:00 AM  Result Value Ref Range   Source: Comment     Comment: URINE   Diagnosis: Comment     Comment: URINE NEGATIVE FOR HIGH-GRADE UROTHELIAL CARCINOMA ([place]). FEW UROTHELIAL CELLS ARE PRESENT.    Signed out by: Comment     Comment: Porfirio Mylar, MD, Pathologist   Performed By: Comment     Comment: Orson Aloe, Cytotechnologist (ASCP)   Gross description: Comment     Comment: 5 ML, PALE YELLOW, CLEAR /LCS    Objective  Body mass index is 55.37 kg/m. Wt Readings from Last 3 Encounters:  12/15/17 (!) 322 lb 9.6 oz (146.3 kg)  09/21/17 (!) 320 lb 9.6 oz (145.4 kg)  08/24/17 (!) 316 lb 12.8 oz (143.7 kg)   Temp Readings from Last 3 Encounters:  12/15/17 98.2 F (36.8 C) (Oral)  09/21/17 98.3 F (36.8 C) (Oral)  08/24/17 98.4 F (36.9 C) (Oral)   BP Readings from Last 3 Encounters:  12/15/17 (!) 146/82  09/21/17 (!) 146/94  08/24/17 (!) 176/100   Pulse Readings from Last 3 Encounters:  12/15/17 94  09/21/17 (!) 103  08/24/17 (!) 105    Physical Exam  Constitutional: She is oriented to person, place, and time. She appears well-developed and well-nourished. She is cooperative.  HENT:  Head:  Normocephalic and atraumatic.  Mouth/Throat: Oropharynx is clear and moist and mucous membranes are normal.  Eyes: Pupils are equal, round, and reactive to light. Conjunctivae are normal.  Cardiovascular: Normal rate, regular rhythm and normal heart sounds.  Pulmonary/Chest: Effort normal and breath sounds normal.  Genitourinary:    Pelvic exam was performed with patient supine. There is no rash on the right labia. There is no rash on the left labia.  Neurological: She is alert and oriented to person, place, and time. Gait normal.  Skin: Skin is warm, dry and intact.  Psychiatric: She has a normal mood and affect. Her speech is normal and behavior is normal. Judgment and thought content normal. Cognition and memory are normal.  Nursing note and vitals reviewed.   Assessment   1. HTN /HLD 2. DM 2 A1C 6.7 today poc 6.2  3. Vaginitis  4. HM Plan   1. Cont meds  Consider increase norvasc to 5 mg qd  2. A1C check today  Cont metformin  [facility] records [facility]  3. Wet prep today  Flagyl bid x 7 days Disc partner getting tested and tx'ed as well can get from sex  4.  Flu had today  Tdap had  pna 23 had  mammo neg 10/12/17  rec healthy diet and exercise  rec MMR vx health dept Consider hep B testing in future      Provider: Dr. Olivia Mackie McLean-Scocuzza-Internal Medicine

## 2017-12-15 NOTE — Telephone Encounter (Signed)
See triage note.

## 2017-12-18 ENCOUNTER — Encounter: Payer: Self-pay | Admitting: Internal Medicine

## 2017-12-18 LAB — VAGINITIS/VAGINOSIS, DNA PROBE
CANDIDA SPECIES: NEGATIVE
GARDNERELLA VAGINALIS: POSITIVE — AB
TRICHOMONAS VAG: NEGATIVE

## 2017-12-18 LAB — SPECIMEN STATUS REPORT

## 2017-12-22 ENCOUNTER — Ambulatory Visit: Payer: Managed Care, Other (non HMO) | Admitting: Internal Medicine

## 2017-12-27 ENCOUNTER — Ambulatory Visit: Payer: 59 | Admitting: Family

## 2018-01-16 ENCOUNTER — Telehealth: Payer: Self-pay | Admitting: Internal Medicine

## 2018-01-16 NOTE — Telephone Encounter (Signed)
Does pt want to see dietitian nutritionist about weight or I rec she call the nutritionist offered via phone line with her job ?  Thanks [employer]

## 2018-01-16 NOTE — Telephone Encounter (Signed)
mychart message has been sent to inform patient. 

## 2018-01-30 ENCOUNTER — Ambulatory Visit: Payer: Managed Care, Other (non HMO) | Admitting: Internal Medicine

## 2018-01-30 ENCOUNTER — Encounter: Payer: Self-pay | Admitting: Internal Medicine

## 2018-01-30 VITALS — BP 144/80 | HR 104 | Temp 98.6°F | Ht 64.0 in | Wt 314.8 lb

## 2018-01-30 DIAGNOSIS — N760 Acute vaginitis: Secondary | ICD-10-CM

## 2018-01-30 DIAGNOSIS — I10 Essential (primary) hypertension: Secondary | ICD-10-CM | POA: Diagnosis not present

## 2018-01-30 DIAGNOSIS — B9689 Other specified bacterial agents as the cause of diseases classified elsewhere: Secondary | ICD-10-CM

## 2018-01-30 DIAGNOSIS — M722 Plantar fascial fibromatosis: Secondary | ICD-10-CM | POA: Diagnosis not present

## 2018-01-30 MED ORDER — CHLORTHALIDONE 25 MG PO TABS: 25.0000 mg | ORAL_TABLET | Freq: Every day | ORAL | 3 refills | Status: DC

## 2018-01-30 MED ORDER — METRONIDAZOLE 500 MG PO TABS: 500.0000 mg | ORAL_TABLET | Freq: Two times a day (BID) | ORAL | 0 refills | Status: DC

## 2018-01-30 NOTE — Patient Instructions (Addendum)
Stop Hydrochlorothiazide 25 and take chlorthalidone 25 mg in am   Plantar Fasciitis Plantar fasciitis is a painful foot condition that affects the heel. It occurs when the band of tissue that connects the toes to the heel bone (plantar fascia) becomes irritated. This can happen after exercising too much or doing other repetitive activities (overuse injury). The pain from plantar fasciitis can range from mild irritation to severe pain that makes it difficult for you to walk or move. The pain is usually worse in the morning or after you have been sitting or lying down for a while. What are the causes? This condition may be caused by:  Standing for long periods of time.  Wearing shoes that do not fit.  Doing high-impact activities, including running, aerobics, and ballet.  Being overweight.  Having an abnormal way of walking (gait).  Having tight calf muscles.  Having high arches in your feet.  Starting a new athletic activity.  What are the signs or symptoms? The main symptom of this condition is heel pain. Other symptoms include:  Pain that gets worse after activity or exercise.  Pain that is worse in the morning or after resting.  Pain that goes away after you walk for a few minutes.  How is this diagnosed? This condition may be diagnosed based on your signs and symptoms. Your health care provider will also do a physical exam to check for:  A tender area on the bottom of your foot.  A high arch in your foot.  Pain when you move your foot.  Difficulty moving your foot.  You may also need to have imaging studies to confirm the diagnosis. These can include:  X-rays.  Ultrasound.  MRI.  How is this treated? Treatment for plantar fasciitis depends on the severity of the condition. Your treatment may include:  Rest, ice, and over-the-counter pain medicines to manage your pain.  Exercises to stretch your calves and your plantar fascia.  A splint that holds your foot  in a stretched, upward position while you sleep (night splint).  Physical therapy to relieve symptoms and prevent problems in the future.  Cortisone injections to relieve severe pain.  Extracorporeal shock wave therapy (ESWT) to stimulate damaged plantar fascia with electrical impulses. It is often used as a last resort before surgery.  Surgery, if other treatments have not worked after 12 months.  Follow these instructions at home:  Take medicines only as directed by your health care provider.  Avoid activities that cause pain.  Roll the bottom of your foot over a bag of ice or a bottle of cold water. Do this for 20 minutes, 3-4 times a day.  Perform simple stretches as directed by your health care provider.  Try wearing athletic shoes with air-sole or gel-sole cushions or soft shoe inserts.  Wear a night splint while sleeping, if directed by your health care provider.  Keep all follow-up appointments with your health care provider. How is this prevented?  Do not perform exercises or activities that cause heel pain.  Consider finding low-impact activities if you continue to have problems.  Lose weight if you need to. The best way to prevent plantar fasciitis is to avoid the activities that aggravate your plantar fascia. Contact a health care provider if:  Your symptoms do not go away after treatment with home care measures.  Your pain gets worse.  Your pain affects your ability to move or do your daily activities. This information is not intended to replace  advice given to you by your health care provider. Make sure you discuss any questions you have with your health care provider. Document Released: 12/14/2000 Document Revised: 08/24/2015 Document Reviewed: 01/29/2014 Elsevier Interactive Patient Education  [employer].

## 2018-01-30 NOTE — Progress Notes (Signed)
Pre visit review using our clinic review tool, if applicable. No additional management support is needed unless otherwise documented below in the visit note. 

## 2018-01-30 NOTE — Progress Notes (Signed)
Chief Complaint  Patient presents with  . Plantar Fasciitis   F/u  1. HTN on hctz 25 mg qd, lis 40 mg qd, norvasc 5 mg had meds today  2. C/o right heel pain h/o plantar fascitis 8/10 right foot pain and boot given steroid shot by Dr. Cleda Mccreedy years ago and helped. Tried bengay as well helped pain not as bad as last week  3. Thinks she has recurrent BV again    Review of Systems  Constitutional: Negative for weight loss.  HENT: Negative for hearing loss.   Eyes: Negative for blurred vision.  Respiratory: Negative for shortness of breath.   Cardiovascular: Negative for chest pain.  Genitourinary:       Vaginal discharge   Musculoskeletal: Positive for joint pain.  Skin: Negative for rash.  Neurological: Negative for headaches.  Psychiatric/Behavioral: Negative for depression.   Past Medical History:  Diagnosis Date  . Alcohol abuse   . Arthritis   . Cervical radiculopathy   . Diabetes mellitus without complication ([place]) 6295  . Herpes    1 and 2 since 42 y.o   . Hyperlipidemia   . Hypertension   . Morbid obesity ([place])   . OSA (obstructive sleep apnea)    Not on CPAP  . Psoriasis   . Tobacco abuse    No past surgical history on file. Family History  Problem Relation Age of Onset  . Alcohol abuse Maternal Grandmother   . Cancer Maternal Grandmother        Ovarian  . Hypertension Maternal Grandmother   . Diabetes Maternal Grandmother   . Hypertension Mother   . Diabetes Mother   . Colon cancer Other 75  . Breast cancer Neg Hx    Social History   Socioeconomic History  . Marital status: Single    Spouse name: Not on file  . Number of children: 0  . Years of education: Not on file  . Highest education level: Not on file  Occupational History  . Not on file  Social Needs  . Financial resource strain: Not on file  . Food insecurity:    Worry: Not on file    Inability: Not on file  . Transportation needs:    Medical: Not on file    Non-medical: Not on file    Tobacco Use  . Smoking status: Former Smoker    Packs/day: 0.50    Types: Cigarettes  . Smokeless tobacco: Never Used  . Tobacco comment: stopped 11/2015  Substance and Sexual Activity  . Alcohol use: Yes    Alcohol/week: 0.0 standard drinks    Comment: occasionally  . Drug use: No  . Sexual activity: Yes    Partners: Male    Birth control/protection: None    Comment: 1 partner  Lifestyle  . Physical activity:    Days per week: Not on file    Minutes per session: Not on file  . Stress: Not on file  Relationships  . Social connections:    Talks on phone: Not on file    Gets together: Not on file    Attends religious service: Not on file    Active member of club or organization: Not on file    Attends meetings of clubs or organizations: Not on file    Relationship status: Not on file  . Intimate partner violence:    Fear of current or ex partner: Not on file    Emotionally abused: Not on file    Physically abused:  Not on file    Forced sexual activity: Not on file  Other Topics Concern  . 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    Associates Degree   No children    No caffeine   Current Meds  Medication Sig  . acetaminophen (TYLENOL) 650 MG CR tablet Take 650 mg by mouth every 8 (eight) hours as needed for pain.  Marland Kitchen amLODipine (NORVASC) 5 MG tablet Take 1 tablet (5 mg total) by mouth daily.  Marland Kitchen aspirin 81 MG tablet Take 81 mg by mouth daily.  Marland Kitchen aspirin-acetaminophen-caffeine (EXCEDRIN MIGRAINE) 250-250-65 MG tablet Take 1 tablet by mouth every 6 (six) hours as needed for headache.  . Cholecalciferol 50000 units capsule Take 1 capsule (50,000 Units total) by mouth once a week.  . clotrimazole-betamethasone (LOTRISONE) cream Apply 1 application topically 2 (two) times daily.  . cyclobenzaprine (FLEXERIL) 5 MG tablet Take 1 tablet (5 mg total) by mouth daily as needed for muscle spasms.  Marland Kitchen lisinopril  (PRINIVIL,ZESTRIL) 40 MG tablet Take 1 tablet (40 mg total) by mouth daily.  . metFORMIN (GLUCOPHAGE) 500 MG tablet TAKE 1 TABLET BY MOUTH TWO  TIMES DAILY WITH MEALS  . metroNIDAZOLE (FLAGYL) 500 MG tablet Take 1 tablet (500 mg total) by mouth 2 (two) times daily. With food  . pravastatin (PRAVACHOL) 40 MG tablet Take 1 tablet (40 mg total) by mouth daily.  . valACYclovir (VALTREX) 500 MG tablet Take 1 tablet (500 mg total) by mouth daily. Bid x 5 days prn  . [DISCONTINUED] hydrochlorothiazide (HYDRODIURIL) 25 MG tablet Take 1 tablet (25 mg total) by mouth daily. 1/2 pill x 3 days then 1 tablet in am  . [DISCONTINUED] metroNIDAZOLE (FLAGYL) 500 MG tablet Take 1 tablet (500 mg total) by mouth 2 (two) times daily. With food   No Known Allergies Recent Results (from the past 2160 hour(s))  Vaginitis/Vaginosis, DNA Probe     Status: Abnormal   Collection Time: 12/15/17 12:00 AM  Result Value Ref Range   Candida Species Negative Negative   Gardnerella vaginalis Positive (A) Negative   Trichomonas vaginosis Negative Negative  Specimen status report     Status: None   Collection Time: 12/15/17 12:00 AM  Result Value Ref Range   specimen status report Comment     Comment: Test Code Change Test Code Change Please note that the Microbiology test code was changed to reflect the specimen source or transport received.   POCT glycosylated hemoglobin (Hb A1C)     Status: Abnormal   Collection Time: 12/15/17  2:34 PM  Result Value Ref Range   Hemoglobin A1C 6.2 (A) 4.0 - 5.6 %   HbA1c POC (<> result, manual entry)     HbA1c, POC (prediabetic range)     HbA1c, POC (controlled diabetic range)     Objective  Body mass index is 54.04 kg/m. Wt Readings from Last 3 Encounters:  01/30/18 (!) 314 lb 12.8 oz (142.8 kg)  12/15/17 (!) 322 lb 9.6 oz (146.3 kg)  09/21/17 (!) 320 lb 9.6 oz (145.4 kg)   Temp Readings from Last 3 Encounters:  01/30/18 98.6 F (37 C) (Oral)  12/15/17 98.2 F (36.8 C)  (Oral)  09/21/17 98.3 F (36.8 C) (Oral)   BP Readings from Last 3 Encounters:  01/30/18 (!) 144/80  12/15/17 (!) 146/82  09/21/17 (!) 146/94   Pulse Readings from Last 3 Encounters:  01/30/18 Marland Kitchen)  104  12/15/17 94  09/21/17 (!) 103    Physical Exam  Constitutional: She is oriented to person, place, and time. Vital signs are normal. She appears well-developed and well-nourished. She is cooperative.  HENT:  Head: Normocephalic and atraumatic.  Mouth/Throat: Oropharynx is clear and moist and mucous membranes are normal.  Eyes: Pupils are equal, round, and reactive to light. Conjunctivae are normal.  Cardiovascular: Normal rate, regular rhythm and normal heart sounds.  Pulmonary/Chest: Effort normal and breath sounds normal.  Musculoskeletal:       Feet:  Neurological: She is alert and oriented to person, place, and time. Gait normal.  Skin: Skin is warm, dry and intact.  Psychiatric: She has a normal mood and affect. Her speech is normal and behavior is normal. Judgment and thought content normal. Cognition and memory are normal.  Nursing note and vitals reviewed.   Assessment   1. HTN uncontrolled  2. Recurrent plantar fascitis right midfoot and heel pain  3. Recurrent bv  4. HM Plan   1. norvasc 5 mg qd, chlorthalidone 25 mg qd, lis 40 mg qd  2. Refer podiatry Dr. Cleda Mccreedy  3. Flagyl  4.  Flu utd  Tdap had  pna 23 had  mammo neg 10/12/17  Pap 08/24/17 neg pap neg HPV   rec healthy diet and exercise rec MMR vx health dept Consider hep B testing in future    Provider: Dr. Olivia Mackie McLean-Scocuzza-Internal Medicine

## 2018-03-16 ENCOUNTER — Ambulatory Visit (INDEPENDENT_AMBULATORY_CARE_PROVIDER_SITE_OTHER): Payer: Managed Care, Other (non HMO)

## 2018-03-16 ENCOUNTER — Ambulatory Visit: Payer: Managed Care, Other (non HMO) | Admitting: Internal Medicine

## 2018-03-16 ENCOUNTER — Encounter: Payer: Self-pay | Admitting: Internal Medicine

## 2018-03-16 VITALS — BP 128/80 | HR 111 | Temp 98.9°F | Ht 64.0 in | Wt 324.2 lb

## 2018-03-16 DIAGNOSIS — M25562 Pain in left knee: Secondary | ICD-10-CM

## 2018-03-16 MED ORDER — KETOROLAC TROMETHAMINE 60 MG/2ML IM SOLN
60.0000 mg | Freq: Once | INTRAMUSCULAR | Status: AC
  Administered 2018-03-16: 30 mg via INTRAMUSCULAR

## 2018-03-16 MED ORDER — METHYLPREDNISOLONE ACETATE 40 MG/ML IJ SUSP
40.0000 mg | Freq: Once | INTRAMUSCULAR | Status: AC
  Administered 2018-03-16: 40 mg via INTRAMUSCULAR

## 2018-03-16 NOTE — Progress Notes (Signed)
Pre visit review using our clinic review tool, if applicable. No additional management support is needed unless otherwise documented below in the visit note. 

## 2018-03-16 NOTE — Progress Notes (Addendum)
Chief Complaint  Patient presents with  . Knee Pain   Left knee pain  C/o left knee pain x 2 days and gait abnormal hurts to walk on knee, she has had chronic knee pain in the past, she also had had left knee swelling. Tried elevation and [place] w/o relief totally, knee pain is in the middle   Review of Systems  Constitutional: Negative for weight loss.  HENT: Negative for hearing loss.   Eyes: Negative for blurred vision.  Respiratory: Negative for cough.   Cardiovascular: Negative for chest pain.  Musculoskeletal: Positive for joint pain.  Skin: Negative for rash.   Past Medical History:  Diagnosis Date  . Alcohol abuse   . Arthritis   . Cervical radiculopathy   . Diabetes mellitus without complication ([place]) 1740  . Herpes    1 and 2 since 42 y.o   . Hyperlipidemia   . Hypertension   . Morbid obesity ([place])   . OSA (obstructive sleep apnea)    Not on CPAP  . Psoriasis   . Tobacco abuse    History reviewed. No pertinent surgical history. Family History  Problem Relation Age of Onset  . Alcohol abuse Maternal Grandmother   . Cancer Maternal Grandmother        Ovarian  . Hypertension Maternal Grandmother   . Diabetes Maternal Grandmother   . Hypertension Mother   . Diabetes Mother   . Colon cancer Other 69  . Breast cancer Neg Hx    Social History   Socioeconomic History  . Marital status: Single    Spouse name: Not on file  . Number of children: 0  . Years of education: Not on file  . Highest education level: Not on file  Occupational History  . Not on file  Social Needs  . Financial resource strain: Not on file  . Food insecurity:    Worry: Not on file    Inability: Not on file  . Transportation needs:    Medical: Not on file    Non-medical: Not on file  Tobacco Use  . Smoking status: Former Smoker    Packs/day: 0.50    Types: Cigarettes  . Smokeless tobacco: Never Used  . Tobacco comment: stopped 11/2015  Substance and Sexual Activity  . Alcohol  use: Yes    Alcohol/week: 0.0 standard drinks    Comment: occasionally  . Drug use: No  . Sexual activity: Yes    Partners: Male    Birth control/protection: None    Comment: 1 partner  Lifestyle  . Physical activity:    Days per week: Not on file    Minutes per session: Not on file  . Stress: Not on file  Relationships  . Social connections:    Talks on phone: Not on file    Gets together: Not on file    Attends religious service: Not on file    Active member of club or organization: Not on file    Attends meetings of clubs or organizations: Not on file    Relationship status: Not on file  . Intimate partner violence:    Fear of current or ex partner: Not on file    Emotionally abused: Not on file    Physically abused: Not on file    Forced sexual activity: Not on file  Other Topics Concern  . 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  Associates Degree   No children    No caffeine   Current Meds  Medication Sig  . acetaminophen (TYLENOL) 650 MG CR tablet Take 650 mg by mouth every 8 (eight) hours as needed for pain.  Marland Kitchen amLODipine (NORVASC) 5 MG tablet Take 1 tablet (5 mg total) by mouth daily.  Marland Kitchen aspirin 81 MG tablet Take 81 mg by mouth daily.  Marland Kitchen aspirin-acetaminophen-caffeine (EXCEDRIN MIGRAINE) 250-250-65 MG tablet Take 1 tablet by mouth every 6 (six) hours as needed for headache.  . chlorthalidone (HYGROTON) 25 MG tablet Take 1 tablet (25 mg total) by mouth daily.  . Cholecalciferol 50000 units capsule Take 1 capsule (50,000 Units total) by mouth once a week.  . clotrimazole-betamethasone (LOTRISONE) cream Apply 1 application topically 2 (two) times daily.  . cyclobenzaprine (FLEXERIL) 5 MG tablet Take 1 tablet (5 mg total) by mouth daily as needed for muscle spasms.  Marland Kitchen lisinopril (PRINIVIL,ZESTRIL) 40 MG tablet Take 1 tablet (40 mg total) by mouth daily.  . metFORMIN (GLUCOPHAGE) 500 MG tablet TAKE  1 TABLET BY MOUTH TWO  TIMES DAILY WITH MEALS  . metroNIDAZOLE (FLAGYL) 500 MG tablet Take 1 tablet (500 mg total) by mouth 2 (two) times daily. With food  . pravastatin (PRAVACHOL) 40 MG tablet Take 1 tablet (40 mg total) by mouth daily.  . valACYclovir (VALTREX) 500 MG tablet Take 1 tablet (500 mg total) by mouth daily. Bid x 5 days prn   No Known Allergies No results found for this or any previous visit (from the past 2160 hour(s)). Objective  Body mass index is 55.65 kg/m. Wt Readings from Last 3 Encounters:  03/16/18 (!) 324 lb 3.2 oz (147.1 kg)  01/30/18 (!) 314 lb 12.8 oz (142.8 kg)  12/15/17 (!) 322 lb 9.6 oz (146.3 kg)   Temp Readings from Last 3 Encounters:  03/16/18 98.9 F (37.2 C) (Oral)  01/30/18 98.6 F (37 C) (Oral)  12/15/17 98.2 F (36.8 C) (Oral)   BP Readings from Last 3 Encounters:  03/16/18 128/80  01/30/18 (!) 144/80  12/15/17 (!) 146/82   Pulse Readings from Last 3 Encounters:  03/16/18 (!) 111  01/30/18 (!) 104  12/15/17 94    Physical Exam Vitals signs and nursing note reviewed.  Constitutional:      Appearance: Normal appearance. She is morbidly obese.  HENT:     Head: Normocephalic and atraumatic.     Nose: Nose normal.     Mouth/Throat:     Mouth: Mucous membranes are moist.     Pharynx: Oropharynx is clear.  Eyes:     Conjunctiva/sclera: Conjunctivae normal.     Pupils: Pupils are equal, round, and reactive to light.  Cardiovascular:     Rate and Rhythm: Normal rate and regular rhythm.  Pulmonary:     Effort: Pulmonary effort is normal.     Breath sounds: Normal breath sounds.  Musculoskeletal:     Left knee: Tenderness found. Medial joint line and lateral joint line tenderness noted.  Skin:    General: Skin is warm and dry.  Neurological:     General: No focal deficit present.     Mental Status: She is alert and oriented to person, place, and time.     Gait: Gait normal.  Psychiatric:        Attention and Perception:  Attention and perception normal.        Mood and Affect: Mood and affect normal.        Speech: Speech normal.  Behavior: Behavior normal. Behavior is cooperative.        Thought Content: Thought content normal.        Cognition and Memory: Cognition and memory normal.        Judgment: Judgment normal.     Assessment   1. Left knee pain  Plan   1. toradol 30 x 1 today depomedrol 40 x 1  Refer to ortho  Consider MRI if not improving   Labs [facility] not fasting 07/02/2018 Cr 0.47 EGFR >59 na 141, K 3.7, alk phos 92, AS 15, ALT 13, total chol 156, hdl 45, LDL 75, Tgs 179  WBC 8.3 H/H 10.4/33.4/ plts 487 slightly high nl 450  quantiferon gold negative  Iron 27 nl 27-159  ggt 21 normal   Provider: Dr. Olivia Mackie McLean-Scocuzza-Internal Medicine

## 2018-03-16 NOTE — Patient Instructions (Signed)
We referred to [facility] Dr. Ernest PineHooten  Continue tylenol for pain if needed [facility] has a walk in clinic   Knee Pain, Adult Knee pain in adults is common. It can be caused by many things, including:  Arthritis.  A fluid-filled sac (cyst) or growth in your knee.  An infection in your knee.  An injury that will not heal.  Damage, swelling, or irritation of the tissues that support your knee.  Knee pain is usually not a sign of a serious problem. The pain may go away on its own with time and rest. If it does not, a health care provider may order tests to find the cause of the pain. These may include:  Imaging tests, such as an X-ray, MRI, or ultrasound.  Joint aspiration. In this test, fluid is removed from the knee.  Arthroscopy. In this test, a lighted tube is inserted into knee and an image is projected onto a TV screen.  A biopsy. In this test, a sample of tissue is removed from the body and studied under a microscope.  Follow these instructions at home: Pay attention to any changes in your symptoms. Take these actions to relieve your pain. Activity  Rest your knee.  Do not do things that cause pain or make pain worse.  Avoid high-impact activities or exercises, such as running, jumping rope, or doing jumping jacks. General instructions  Take over-the-counter and prescription medicines only as told by your health care provider.  Raise (elevate) your knee above the level of your heart when you are sitting or lying down.  Sleep with a pillow under your knee.  If directed, apply ice to the knee: ? Put ice in a plastic bag. ? Place a towel between your skin and the bag. ? Leave the ice on for 20 minutes, 2-3 times a day.  Ask your health care provider if you should wear an elastic knee support.  Lose weight if you are overweight. Extra weight can put pressure on your knee.  Do not use any products that contain nicotine or tobacco, such as cigarettes and  e-cigarettes. Smoking may slow the healing of any bone and joint problems that you may have. If you need help quitting, ask your health care provider. Contact a health care provider if:  Your knee pain continues, changes, or gets worse.  You have a fever along with knee pain.  Your knee buckles or locks up.  Your knee swells, and the swelling becomes worse. Get help right away if:  Your knee feels warm to the touch.  You cannot move your knee.  You have severe pain in your knee.  You have chest pain.  You have trouble breathing. Summary  Knee pain in adults is common. It can be caused by many things, including, arthritis, infection, cysts, or injury.  Knee pain is usually not a sign of a serious problem, but if it does not go away, a health care provider may perform tests to know the cause of the pain.  Pay attention to any changes in your symptoms. Relieve your pain with rest, medicines, light activity, and use of ice.  Get help if your pain continues or becomes very severe, or if your knee buckles or locks up, or if you have chest pain or trouble breathing. This information is not intended to replace advice given to you by your health care provider. Make sure you discuss any questions you have with your health care provider. Document Released: 01/16/2007  Document Revised: 03/11/2016 Document Reviewed: 03/11/2016 Elsevier Interactive Patient Education  [employer].

## 2018-04-05 ENCOUNTER — Other Ambulatory Visit: Payer: Self-pay | Admitting: Family

## 2018-04-05 DIAGNOSIS — E1165 Type 2 diabetes mellitus with hyperglycemia: Secondary | ICD-10-CM

## 2018-04-10 ENCOUNTER — Telehealth: Payer: Self-pay | Admitting: Internal Medicine

## 2018-04-10 ENCOUNTER — Ambulatory Visit: Payer: Managed Care, Other (non HMO) | Admitting: Internal Medicine

## 2018-04-10 ENCOUNTER — Other Ambulatory Visit: Payer: Self-pay | Admitting: Internal Medicine

## 2018-04-10 MED ORDER — PHENTERMINE HCL 37.5 MG PO TABS: 37.5000 mg | ORAL_TABLET | Freq: Every day | ORAL | 0 refills | Status: DC

## 2018-04-10 NOTE — Telephone Encounter (Signed)
Copied from CRM [id number]. Topic: Quick Communication - Rx Refill/Question >> Apr 10, 2018 11:42 AM Zada Girt, Lumin L wrote: Medication: Adipex (weight los medication that she wants to know if Dr. Shirlee Latch approves of)  Has the patient contacted their pharmacy? Yes.   (Agent: If no, request that the patient contact the pharmacy for the refill.) (Agent: If yes, when and what did the pharmacy advise?)  Preferred Pharmacy (with phone number or street name): [id number] [place], [place] - [address] AT [facility] OF [address] [place] [address] Phone: [phone] Fax: [phone]  Agent: Please be advised that RX refills may take up to 3 business days. We ask that you follow-up with your pharmacy.

## 2018-04-10 NOTE — Telephone Encounter (Signed)
Now that blood pressure is controlled we can try this in the am 37.5 but she will need to schedule f/u in 2 months   -please schedule f/u in 2 months ?   She will also need to eat healthy and exercise  We can do this medication adipex 37.5 x 2 months, f/u then another 2 months if she is losing and only for 4 months at a time then she has to give her body a 3 month break if she is tolerating it   TMS

## 2018-04-16 NOTE — Telephone Encounter (Signed)
Patient has been informed. She stated she will return call to schedule 2 month follow up appointment.

## 2018-05-11 ENCOUNTER — Ambulatory Visit: Payer: Managed Care, Other (non HMO) | Admitting: Internal Medicine

## 2018-06-27 ENCOUNTER — Encounter: Payer: Self-pay | Admitting: Internal Medicine

## 2018-06-27 ENCOUNTER — Other Ambulatory Visit: Payer: Self-pay

## 2018-06-27 ENCOUNTER — Ambulatory Visit: Payer: Self-pay

## 2018-06-27 ENCOUNTER — Telehealth (INDEPENDENT_AMBULATORY_CARE_PROVIDER_SITE_OTHER): Payer: Managed Care, Other (non HMO) | Admitting: Internal Medicine

## 2018-06-27 DIAGNOSIS — J069 Acute upper respiratory infection, unspecified: Secondary | ICD-10-CM

## 2018-06-27 DIAGNOSIS — J029 Acute pharyngitis, unspecified: Secondary | ICD-10-CM

## 2018-06-27 DIAGNOSIS — Z111 Encounter for screening for respiratory tuberculosis: Secondary | ICD-10-CM

## 2018-06-27 MED ORDER — FLUCONAZOLE 150 MG PO TABS: 150.0000 mg | ORAL_TABLET | Freq: Once | ORAL | 0 refills | Status: AC

## 2018-06-27 MED ORDER — BENZONATATE 200 MG PO CAPS: 200.0000 mg | ORAL_CAPSULE | Freq: Three times a day (TID) | ORAL | 0 refills | Status: DC | PRN

## 2018-06-27 MED ORDER — AZITHROMYCIN 250 MG PO TABS: ORAL_TABLET | ORAL | 0 refills | Status: DC

## 2018-06-27 NOTE — Progress Notes (Signed)
Virtual Visit via Video Note  I connected with@ on @TODAY @ at 11:00 AM EDT by a video enabled telemedicine application and verified that I am speaking with the correct person using two identifiers. Location patient: home Location provider:work  Persons participating in the virtual visit: patient, provider  I discussed the limitations of evaluation and management by telemedicine and the availability of in person appointments. The patient expressed understanding and agreed to proceed.   HPI: Sore throat and cough with phelgm (yellow now clear) since Tuesday and hoarse voice and fever 99.9 and max 100.4. no sick contacts works at [employer] but trying to stay away from COVID 19 testing areas. No known sick contacts but she has been to the grocery store and tried to avoid restaurants. No body aches, dry cough, sob, fever resolved for now has tried Robitussin DM generic for cough and today warm salt gargles throat pain 3-4/10 still with sore throat    ROS: See pertinent positives and negatives per HPI.  Past Medical History:  Diagnosis Date  . Alcohol abuse   . Arthritis   . Cervical radiculopathy   . Diabetes mellitus without complication ([facility]) 2000  . Herpes    1 and 2 since 43 y.o   . Hyperlipidemia   . Hypertension   . Morbid obesity ([facility])   . OSA (obstructive sleep apnea)    Not on CPAP  . Psoriasis   . Tobacco abuse     No past surgical history on file.  Family History  Problem Relation Age of Onset  . Alcohol abuse Maternal Grandmother   . Cancer Maternal Grandmother        Ovarian  . Hypertension Maternal Grandmother   . Diabetes Maternal Grandmother   . Hypertension Mother   . Diabetes Mother   . Colon cancer Other 80  . Breast cancer Neg Hx     SOCIAL HX: of note pt works at [employer] mentions today no longer with boyfriend longterm BF   Current Outpatient Medications:  .  acetaminophen (TYLENOL) 650 MG CR tablet, Take 650 mg by mouth every 8 (eight) hours as  needed for pain., Disp: , Rfl:  .  amLODipine (NORVASC) 5 MG tablet, Take 1 tablet (5 mg total) by mouth daily., Disp: 90 tablet, Rfl: 3 .  aspirin 81 MG tablet, Take 81 mg by mouth daily., Disp: , Rfl:  .  aspirin-acetaminophen-caffeine (EXCEDRIN MIGRAINE) 250-250-65 MG tablet, Take 1 tablet by mouth every 6 (six) hours as needed for headache., Disp: , Rfl:  .  azithromycin (ZITHROMAX) 250 MG tablet, 2 pills day 1 and 1 pill day 2-5, Disp: 6 tablet, Rfl: 0 .  benzonatate (TESSALON) 200 MG capsule, Take 1 capsule (200 mg total) by mouth 3 (three) times daily as needed for cough., Disp: 40 capsule, Rfl: 0 .  chlorthalidone (HYGROTON) 25 MG tablet, Take 1 tablet (25 mg total) by mouth daily., Disp: 90 tablet, Rfl: 3 .  Cholecalciferol 50000 units capsule, Take 1 capsule (50,000 Units total) by mouth once a week., Disp: 13 capsule, Rfl: 1 .  clotrimazole-betamethasone (LOTRISONE) cream, Apply 1 application topically 2 (two) times daily., Disp: 30 g, Rfl: 0 .  cyclobenzaprine (FLEXERIL) 5 MG tablet, Take 1 tablet (5 mg total) by mouth daily as needed for muscle spasms., Disp: 90 tablet, Rfl: 0 .  fluconazole (DIFLUCAN) 150 MG tablet, Take 1 tablet (150 mg total) by mouth once for 1 dose., Disp: 1 tablet, Rfl: 0 .  lisinopril (PRINIVIL,ZESTRIL) 40 MG tablet,  Take 1 tablet (40 mg total) by mouth daily., Disp: 90 tablet, Rfl: 1 .  metFORMIN (GLUCOPHAGE) 500 MG tablet, TAKE 1 TABLET BY MOUTH TWO  TIMES DAILY WITH MEALS, Disp: 180 tablet, Rfl: 1 .  metroNIDAZOLE (FLAGYL) 500 MG tablet, Take 1 tablet (500 mg total) by mouth 2 (two) times daily. With food, Disp: 14 tablet, Rfl: 0 .  phentermine (ADIPEX-P) 37.5 MG tablet, Take 1 tablet (37.5 mg total) by mouth daily before breakfast., Disp: 60 tablet, Rfl: 0 .  pravastatin (PRAVACHOL) 40 MG tablet, Take 1 tablet (40 mg total) by mouth daily., Disp: 90 tablet, Rfl: 3 .  valACYclovir (VALTREX) 500 MG tablet, Take 1 tablet (500 mg total) by mouth daily. Bid x 5  days prn, Disp: 90 tablet, Rfl: 3  EXAM: unable to perform comprehensive exam  VITALS per patient if applicable:  GENERAL: alert, oriented, appears well and in no acute distress  HEENT: atraumatic, conjunttiva clear, no obvious abnormalities on inspection of external nose and ears  NECK: normal movements of the head and neck  LUNGS: no signs of respiratory distress, breathing rate appears normal, no obvious gross SOB, gasping or wheezing  CV: no obvious cyanosis  MS: moves all visible extremities without noticeable abnormality  PSYCH/NEURO: pleasant and cooperative, no obvious depression or anxiety, speech and thought processing grossly intact  ASSESSMENT AND PLAN:  Discussed the following assessment and plan:  Sore throat - Plan: benzonatate (TESSALON) 200 MG capsule, fluconazole (DIFLUCAN) 150 MG tablet, azithromycin (ZITHROMAX) 250 MG tablet  Upper respiratory tract infection, unspecified type - Plan: benzonatate (TESSALON) 200 MG capsule    plan Warm salt gargles  Tylenol prn  Tessalon perles with robitussin  Trial of Zpack with Diflucan for yeast infection Note for work  If worse go to ED   I discussed the assessment and treatment plan with the patient. The patient was provided an opportunity to ask questions and all were answered. The patient agreed with the plan and demonstrated an understanding of the instructions.   The patient was advised to call back or seek an in-person evaluation if the symptoms worsen or if the condition fails to improve as anticipated.  I provided 15 minutes of non-face-to-face time during this encounter via Web Ex    Kaydin Labo N McLean-Scocuzza, MD  

## 2018-06-27 NOTE — Progress Notes (Signed)
See Web Ex note from today 06/27/2018

## 2018-06-27 NOTE — Telephone Encounter (Signed)
Pt c/o sinus pain and congestion since last Tuesday. Pt afebrile today. Pt c/o pan ans moderate. Pt with hoarse voice and dry cough. No SOB or difficulty breathing. Pt also with runny nose, and "popping" noise from ears. Care advice given and pt verbalized understanding. Called [facility] and spoke with Lupita Leash. Advised not to schedule appointment and send note to office high priority. Per FC, the office will call her back for a virtual visit. Called pt and informed her of disposition. Pt verbalized understanding. Reason for Disposition . Lots of coughing  Answer Assessment - Initial Assessment Questions 1. LOCATION: "Where does it hurt?"      headache to the front of the head 2. ONSET: "When did the sinus pain start?"  (e.g., hours, days)      Tuesday last week 3. SEVERITY: "How bad is the pain?"   (Scale 1-10; mild, moderate or severe)   - MILD (1-3): doesn't interfere with normal activities    - MODERATE (4-7): interferes with normal activities (e.g., work or school) or awakens from sleep   - SEVERE (8-10): excruciating pain and patient unable to do any normal activities        moderate 4. RECURRENT SYMPTOM: "Have you ever had sinus problems before?" If so, ask: "When was the last time?" and "What happened that time?"      Yes-seasonal -OTC medications and went away 5. NASAL CONGESTION: "Is the nose blocked?" If so, ask, "Can you open it or must you breathe through the mouth?"     no 6. NASAL DISCHARGE: "Do you have discharge from your nose?" If so ask, "What color?"     Yes-lt yellow to clear 7. FEVER: "Do you have a fever?" If so, ask: "What is it, how was it measured, and when did it start?"      no 8. OTHER SYMPTOMS: "Do you have any other symptoms?" (e.g., sore throat, cough, earache, difficulty breathing)     Hoarse, sinus pressure, runny nose, headache, dry cough, popping noise to ears 9. PREGNANCY: "Is there any chance you are pregnant?" "When was your last menstrual period?"     No- LMP:  last week  Protocols used: SINUS PAIN OR CONGESTION-A-AH

## 2018-06-27 NOTE — Patient Instructions (Addendum)
Virtual Visit via Video Note  I connected with@ on @TODAY @ at 11:00 AM EDT by a video enabled telemedicine application and verified that I am speaking with the correct person using two identifiers. Location patient: home Location provider:work  Persons participating in the virtual visit: patient, provider  I discussed the limitations of evaluation and management by telemedicine and the availability of in person appointments. The patient expressed understanding and agreed to proceed.   HPI: Sore throat and cough with phelgm (yellow now clear) since Tuesday and hoarse voice and fever 99.9 and max 100.4. no sick contacts works at [employer] but trying to stay away from COVID 19 testing areas. No known sick contacts but she has been to the grocery store and tried to avoid restaurants. No body aches, dry cough, sob, fever resolved for now has tried Robitussin DM generic for cough and today warm salt gargles throat pain 3-4/10 still with sore throat    ROS: See pertinent positives and negatives per HPI.  Past Medical History:  Diagnosis Date  . Alcohol abuse   . Arthritis   . Cervical radiculopathy   . Diabetes mellitus without complication (HCC) 2000  . Herpes    1 and 2 since 43 y.o   . Hyperlipidemia   . Hypertension   . Morbid obesity (HCC)   . OSA (obstructive sleep apnea)    Not on CPAP  . Psoriasis   . Tobacco abuse     No past surgical history on file.  Family History  Problem Relation Age of Onset  . Alcohol abuse Maternal Grandmother   . Cancer Maternal Grandmother        Ovarian  . Hypertension Maternal Grandmother   . Diabetes Maternal Grandmother   . Hypertension Mother   . Diabetes Mother   . Colon cancer Other 80  . Breast cancer Neg Hx     SOCIAL HX: of note pt works at [employer] mentions today no longer with boyfriend longterm BF   Current Outpatient Medications:  .  acetaminophen (TYLENOL) 650 MG CR tablet, Take 650 mg by mouth every 8 (eight) hours as  needed for pain., Disp: , Rfl:  .  amLODipine (NORVASC) 5 MG tablet, Take 1 tablet (5 mg total) by mouth daily., Disp: 90 tablet, Rfl: 3 .  aspirin 81 MG tablet, Take 81 mg by mouth daily., Disp: , Rfl:  .  aspirin-acetaminophen-caffeine (EXCEDRIN MIGRAINE) 250-250-65 MG tablet, Take 1 tablet by mouth every 6 (six) hours as needed for headache., Disp: , Rfl:  .  azithromycin (ZITHROMAX) 250 MG tablet, 2 pills day 1 and 1 pill day 2-5, Disp: 6 tablet, Rfl: 0 .  benzonatate (TESSALON) 200 MG capsule, Take 1 capsule (200 mg total) by mouth 3 (three) times daily as needed for cough., Disp: 40 capsule, Rfl: 0 .  chlorthalidone (HYGROTON) 25 MG tablet, Take 1 tablet (25 mg total) by mouth daily., Disp: 90 tablet, Rfl: 3 .  Cholecalciferol 50000 units capsule, Take 1 capsule (50,000 Units total) by mouth once a week., Disp: 13 capsule, Rfl: 1 .  clotrimazole-betamethasone (LOTRISONE) cream, Apply 1 application topically 2 (two) times daily., Disp: 30 g, Rfl: 0 .  cyclobenzaprine (FLEXERIL) 5 MG tablet, Take 1 tablet (5 mg total) by mouth daily as needed for muscle spasms., Disp: 90 tablet, Rfl: 0 .  fluconazole (DIFLUCAN) 150 MG tablet, Take 1 tablet (150 mg total) by mouth once for 1 dose., Disp: 1 tablet, Rfl: 0 .  lisinopril (PRINIVIL,ZESTRIL) 40 MG tablet,  Take 1 tablet (40 mg total) by mouth daily., Disp: 90 tablet, Rfl: 1 .  metFORMIN (GLUCOPHAGE) 500 MG tablet, TAKE 1 TABLET BY MOUTH TWO  TIMES DAILY WITH MEALS, Disp: 180 tablet, Rfl: 1 .  metroNIDAZOLE (FLAGYL) 500 MG tablet, Take 1 tablet (500 mg total) by mouth 2 (two) times daily. With food, Disp: 14 tablet, Rfl: 0 .  phentermine (ADIPEX-P) 37.5 MG tablet, Take 1 tablet (37.5 mg total) by mouth daily before breakfast., Disp: 60 tablet, Rfl: 0 .  pravastatin (PRAVACHOL) 40 MG tablet, Take 1 tablet (40 mg total) by mouth daily., Disp: 90 tablet, Rfl: 3 .  valACYclovir (VALTREX) 500 MG tablet, Take 1 tablet (500 mg total) by mouth daily. Bid x 5  days prn, Disp: 90 tablet, Rfl: 3  EXAM: unable to perform comprehensive exam  VITALS per patient if applicable:  GENERAL: alert, oriented, appears well and in no acute distress  HEENT: atraumatic, conjunttiva clear, no obvious abnormalities on inspection of external nose and ears  NECK: normal movements of the head and neck  LUNGS: no signs of respiratory distress, breathing rate appears normal, no obvious gross SOB, gasping or wheezing  CV: no obvious cyanosis  MS: moves all visible extremities without noticeable abnormality  PSYCH/NEURO: pleasant and cooperative, no obvious depression or anxiety, speech and thought processing grossly intact  ASSESSMENT AND PLAN:  Discussed the following assessment and plan:  Sore throat - Plan: benzonatate (TESSALON) 200 MG capsule, fluconazole (DIFLUCAN) 150 MG tablet, azithromycin (ZITHROMAX) 250 MG tablet  Upper respiratory tract infection, unspecified type - Plan: benzonatate (TESSALON) 200 MG capsule    plan Warm salt gargles  Tylenol prn  Tessalon perles with robitussin  Trial of Zpack with Diflucan for yeast infection Note for work  If worse go to ED   I discussed the assessment and treatment plan with the patient. The patient was provided an opportunity to ask questions and all were answered. The patient agreed with the plan and demonstrated an understanding of the instructions.   The patient was advised to call back or seek an in-person evaluation if the symptoms worsen or if the condition fails to improve as anticipated.  I provided 15 minutes of non-face-to-face time during this encounter via Web Ex    Bevelyn Buckles, MD

## 2018-07-05 ENCOUNTER — Encounter: Payer: Self-pay | Admitting: Internal Medicine

## 2018-07-05 ENCOUNTER — Other Ambulatory Visit: Payer: Self-pay | Admitting: Internal Medicine

## 2018-07-05 DIAGNOSIS — N760 Acute vaginitis: Secondary | ICD-10-CM

## 2018-07-05 DIAGNOSIS — B9689 Other specified bacterial agents as the cause of diseases classified elsewhere: Secondary | ICD-10-CM

## 2018-07-06 ENCOUNTER — Telehealth: Payer: Self-pay | Admitting: Internal Medicine

## 2018-07-06 ENCOUNTER — Encounter: Payer: Self-pay | Admitting: Internal Medicine

## 2018-07-06 ENCOUNTER — Other Ambulatory Visit: Payer: Self-pay | Admitting: Internal Medicine

## 2018-07-06 DIAGNOSIS — R509 Fever, unspecified: Secondary | ICD-10-CM

## 2018-07-06 DIAGNOSIS — R32 Unspecified urinary incontinence: Secondary | ICD-10-CM

## 2018-07-06 DIAGNOSIS — R109 Unspecified abdominal pain: Secondary | ICD-10-CM

## 2018-07-06 DIAGNOSIS — B9689 Other specified bacterial agents as the cause of diseases classified elsewhere: Secondary | ICD-10-CM

## 2018-07-06 DIAGNOSIS — R3911 Hesitancy of micturition: Secondary | ICD-10-CM

## 2018-07-06 DIAGNOSIS — R3989 Other symptoms and signs involving the genitourinary system: Secondary | ICD-10-CM

## 2018-07-06 DIAGNOSIS — N760 Acute vaginitis: Secondary | ICD-10-CM

## 2018-07-06 MED ORDER — METRONIDAZOLE 500 MG PO TABS: 500.0000 mg | ORAL_TABLET | Freq: Two times a day (BID) | ORAL | 0 refills | Status: DC

## 2018-07-06 NOTE — Telephone Encounter (Signed)
Called pt back having bladder pressure, leakage and hesistancy will check urine at [facility] today with fevers and make sure no UTI. She is to wear a mask to do urine at [facility]  TMS

## 2018-07-06 NOTE — Progress Notes (Signed)
Pt sent my chart message last night c/o fever lat night 101 F she took Tylenol and at 3 am temp was 100 then 99. No Cough, sore throat or sob or body aches but does not feel well. No h/o flu or COVID exposure but she does work at [employer]   -she missed work today and wants a note for work  -she is to monitor her fever over the weekend and send a my chart message with the results  -if getting worse rec she go to the ED.   She feels like she has BV and wants Rx for Flagyl    TMS

## 2018-07-08 ENCOUNTER — Encounter: Payer: Self-pay | Admitting: Internal Medicine

## 2018-07-11 ENCOUNTER — Telehealth: Payer: Self-pay | Admitting: Internal Medicine

## 2018-07-11 NOTE — Telephone Encounter (Signed)
Please sch f/u visit for 11/2018 or 12/2018 in office   Thanks TMS

## 2018-07-24 NOTE — Telephone Encounter (Signed)
Pt has been scheduled. Thank you.

## 2018-09-24 ENCOUNTER — Encounter: Payer: Self-pay | Admitting: Internal Medicine

## 2018-09-25 ENCOUNTER — Other Ambulatory Visit: Payer: Self-pay | Admitting: Internal Medicine

## 2018-09-25 DIAGNOSIS — E1165 Type 2 diabetes mellitus with hyperglycemia: Secondary | ICD-10-CM

## 2018-09-25 MED ORDER — METFORMIN HCL 500 MG PO TABS: ORAL_TABLET | ORAL | 3 refills | Status: DC

## 2018-09-26 ENCOUNTER — Encounter: Payer: Self-pay | Admitting: Internal Medicine

## 2018-09-27 ENCOUNTER — Encounter: Payer: Self-pay | Admitting: Internal Medicine

## 2018-10-08 ENCOUNTER — Telehealth: Payer: Self-pay

## 2018-10-08 LAB — HM DIABETES EYE EXAM

## 2018-10-08 NOTE — Telephone Encounter (Signed)
Copied from [place] [id number]. Topic: General - Inquiry >> Oct 08, 2018  4:09 PM Jeri Cos wrote: Reason for CRM: Pt called wanting a copy of her TB shot for her employment. Pt also asked if Dr. Aundra Dubin could give her a referral to the [facility] for a shot in her knee.

## 2018-10-09 ENCOUNTER — Other Ambulatory Visit: Payer: Self-pay | Admitting: Internal Medicine

## 2018-10-09 DIAGNOSIS — G8929 Other chronic pain: Secondary | ICD-10-CM

## 2018-10-09 NOTE — Telephone Encounter (Signed)
Tdap 04/04/2009 here if this is accurate and should be in my chart due in 10 years   No recent TB test this is blood or skin test if this is what she is asking for   Referred to Dr. Cecil Cranker ortho   [place]

## 2018-10-10 NOTE — Telephone Encounter (Signed)
Mychart sent to inform pat.

## 2018-10-22 ENCOUNTER — Telehealth: Payer: Self-pay | Admitting: *Deleted

## 2018-10-22 NOTE — Telephone Encounter (Signed)
Copied from [place] [id number]. Topic: Appointment Scheduling - Scheduling Inquiry for Clinic >> Oct 22, 2018 11:46 AM Yvette Rack wrote: Reason for CRM: Pt called to schedule an appt for flu vaccine. Attempted to transfer pt to office but call was placed on hold. Pt requests call back.

## 2018-10-22 NOTE — Telephone Encounter (Signed)
Advised patient flu shot will not be available until mid august.

## 2018-10-26 ENCOUNTER — Encounter: Payer: Self-pay | Admitting: Internal Medicine

## 2018-10-26 NOTE — Addendum Note (Signed)
Addended by: Orland Mustard on: 10/26/2018 01:23 PM   Modules accepted: Orders

## 2018-10-29 ENCOUNTER — Telehealth: Payer: Self-pay

## 2018-10-29 NOTE — Telephone Encounter (Signed)
Copied from [place] [id number]. Topic: General - Inquiry >> Oct 29, 2018 10:39 AM Richardo Priest, NT wrote: Reason for CRM: Patient is requesting influenza records from last year sent to her on mychart and/or have a copy printed out so she can pick it up. Please advise and call back [phone].

## 2018-10-29 NOTE — Telephone Encounter (Signed)
Copy of record left at front desk for pick up.  Pt aware.

## 2018-11-06 ENCOUNTER — Ambulatory Visit: Payer: Managed Care, Other (non HMO) | Admitting: Internal Medicine

## 2018-11-11 ENCOUNTER — Encounter: Payer: Self-pay | Admitting: Internal Medicine

## 2018-12-03 ENCOUNTER — Encounter: Payer: Self-pay | Admitting: Internal Medicine

## 2018-12-03 ENCOUNTER — Other Ambulatory Visit: Payer: Self-pay | Admitting: Internal Medicine

## 2018-12-03 DIAGNOSIS — B9689 Other specified bacterial agents as the cause of diseases classified elsewhere: Secondary | ICD-10-CM

## 2018-12-03 DIAGNOSIS — N760 Acute vaginitis: Secondary | ICD-10-CM

## 2018-12-03 MED ORDER — METRONIDAZOLE 500 MG PO TABS: 500.0000 mg | ORAL_TABLET | Freq: Two times a day (BID) | ORAL | 2 refills | Status: DC

## 2018-12-26 ENCOUNTER — Other Ambulatory Visit: Payer: Self-pay | Admitting: Internal Medicine

## 2018-12-26 ENCOUNTER — Encounter: Payer: Self-pay | Admitting: Internal Medicine

## 2018-12-26 ENCOUNTER — Telehealth: Payer: Self-pay | Admitting: Internal Medicine

## 2018-12-26 DIAGNOSIS — I10 Essential (primary) hypertension: Secondary | ICD-10-CM

## 2018-12-26 DIAGNOSIS — E785 Hyperlipidemia, unspecified: Secondary | ICD-10-CM

## 2018-12-26 MED ORDER — AMLODIPINE BESYLATE 5 MG PO TABS: 5.0000 mg | ORAL_TABLET | Freq: Every day | ORAL | 3 refills | Status: DC

## 2018-12-26 MED ORDER — PRAVASTATIN SODIUM 40 MG PO TABS: 40.0000 mg | ORAL_TABLET | Freq: Every day | ORAL | 3 refills | Status: DC

## 2018-12-26 NOTE — Telephone Encounter (Signed)
Vitamin D rec D3 4000 IU otc daily   tMS

## 2018-12-26 NOTE — Addendum Note (Signed)
Addended byElpidio Galea T on: 12/26/2018 02:08 PM   Modules accepted: Orders

## 2018-12-26 NOTE — Telephone Encounter (Signed)
Patient was informed.  Patient understood and no questions, comments, or concerns at this time.  

## 2018-12-28 ENCOUNTER — Other Ambulatory Visit: Payer: Self-pay

## 2018-12-28 ENCOUNTER — Ambulatory Visit (INDEPENDENT_AMBULATORY_CARE_PROVIDER_SITE_OTHER): Payer: Managed Care, Other (non HMO)

## 2018-12-28 DIAGNOSIS — Z23 Encounter for immunization: Secondary | ICD-10-CM

## 2018-12-28 NOTE — Progress Notes (Signed)
Patient came in to get flu shot today in LD. Patient tolerated well.

## 2019-01-16 DIAGNOSIS — U071 COVID-19: Secondary | ICD-10-CM | POA: Insufficient documentation

## 2019-01-17 ENCOUNTER — Other Ambulatory Visit: Payer: Self-pay | Admitting: Internal Medicine

## 2019-01-21 LAB — URINALYSIS, ROUTINE W REFLEX MICROSCOPIC
Bilirubin, UA: NEGATIVE
Glucose, UA: NEGATIVE
Ketones, UA: NEGATIVE
Leukocytes,UA: NEGATIVE
Nitrite, UA: NEGATIVE
Protein,UA: NEGATIVE
Specific Gravity, UA: 1.016 (ref 1.005–1.030)
Urobilinogen, Ur: 0.2 mg/dL (ref 0.2–1.0)
pH, UA: 7.5 (ref 5.0–7.5)

## 2019-01-21 LAB — QUANTIFERON-TB GOLD PLUS
QuantiFERON Mitogen Value: >10 IU/mL
QuantiFERON Nil Value: 0.02 IU/mL
QuantiFERON TB1 Ag Value: 0.03 IU/mL
QuantiFERON TB2 Ag Value: 0.02 IU/mL
QuantiFERON-TB Gold Plus: NEGATIVE

## 2019-01-21 LAB — MICROSCOPIC EXAMINATION: Casts: NONE SEEN /lpf

## 2019-01-21 LAB — URINE CULTURE

## 2019-01-22 ENCOUNTER — Encounter: Payer: Self-pay | Admitting: Internal Medicine

## 2019-01-24 ENCOUNTER — Encounter: Payer: Self-pay | Admitting: Internal Medicine

## 2019-01-27 ENCOUNTER — Encounter: Payer: Self-pay | Admitting: Internal Medicine

## 2019-01-29 ENCOUNTER — Other Ambulatory Visit: Payer: Self-pay | Admitting: Internal Medicine

## 2019-01-29 ENCOUNTER — Encounter: Payer: Self-pay | Admitting: Internal Medicine

## 2019-01-29 DIAGNOSIS — J40 Bronchitis, not specified as acute or chronic: Secondary | ICD-10-CM

## 2019-01-29 DIAGNOSIS — U071 COVID-19: Secondary | ICD-10-CM

## 2019-01-29 MED ORDER — ALBUTEROL SULFATE HFA 108 (90 BASE) MCG/ACT IN AERS: 1.0000 | INHALATION_SPRAY | RESPIRATORY_TRACT | 2 refills | Status: DC | PRN

## 2019-01-30 ENCOUNTER — Encounter: Payer: Self-pay | Admitting: Internal Medicine

## 2019-02-01 DIAGNOSIS — Z0279 Encounter for issue of other medical certificate: Secondary | ICD-10-CM

## 2019-02-03 ENCOUNTER — Encounter (INDEPENDENT_AMBULATORY_CARE_PROVIDER_SITE_OTHER): Payer: Self-pay

## 2019-02-04 ENCOUNTER — Telehealth: Payer: Self-pay | Admitting: *Deleted

## 2019-02-04 ENCOUNTER — Encounter: Payer: Self-pay | Admitting: Internal Medicine

## 2019-02-04 NOTE — Telephone Encounter (Signed)
Faxed original , copied for billing and scan mailed original to patient.

## 2019-02-04 NOTE — Telephone Encounter (Signed)
-----   Message from Delorise Jackson, MD sent at 02/04/2019  8:17 AM EST ----- Pts REEd paperwork placed in [place] box late Friday  1. Please copy for our records, scan and fax  2. Mail original to pt   She is messaging about this which is why Im sending to you   Thanks [place]

## 2019-02-16 ENCOUNTER — Encounter: Payer: Self-pay | Admitting: Internal Medicine

## 2019-02-18 NOTE — Telephone Encounter (Signed)
I scheduled patient virtual with you due to recent positive COVID screening on 01/24/19 at least with virtual we can proceed of revaluation of breast.

## 2019-02-20 ENCOUNTER — Other Ambulatory Visit: Payer: Self-pay

## 2019-02-20 ENCOUNTER — Ambulatory Visit (INDEPENDENT_AMBULATORY_CARE_PROVIDER_SITE_OTHER): Payer: Managed Care, Other (non HMO) | Admitting: Internal Medicine

## 2019-02-20 VITALS — BP 148/76 | HR 88 | Ht 64.0 in | Wt 318.0 lb

## 2019-02-20 DIAGNOSIS — E559 Vitamin D deficiency, unspecified: Secondary | ICD-10-CM | POA: Diagnosis not present

## 2019-02-20 DIAGNOSIS — I10 Essential (primary) hypertension: Secondary | ICD-10-CM

## 2019-02-20 DIAGNOSIS — F411 Generalized anxiety disorder: Secondary | ICD-10-CM | POA: Diagnosis not present

## 2019-02-20 DIAGNOSIS — N6320 Unspecified lump in the left breast, unspecified quadrant: Secondary | ICD-10-CM

## 2019-02-20 DIAGNOSIS — Z1329 Encounter for screening for other suspected endocrine disorder: Secondary | ICD-10-CM | POA: Diagnosis not present

## 2019-02-20 DIAGNOSIS — E119 Type 2 diabetes mellitus without complications: Secondary | ICD-10-CM

## 2019-02-20 NOTE — Progress Notes (Signed)
Virtual Visit via Video Note  I connected with Nicole Mendez   on 02/21/19 at 11:30 AM EST by a video enabled telemedicine application and verified that I am speaking with the correct person using two identifiers.  Location patient: home Location provider:work or home office Persons participating in the virtual visit: patient, provider  I discussed the limitations of evaluation and management by telemedicine and the availability of in person appointments. The patient expressed understanding and agreed to proceed.   HPI: 1. Anxiety worse since covid dx does not like to be in crowds but went out for the 1st time to [employer] and felt more anxious with a lot of ppl around thinking they knew she had covid. She declines medication for anxiety. She reports increased shaking and panic and crying episodes and had 2 friends die of covid recently   2. HTN BP elevated today 148/76 has not had meds on norvasc 5 mg qd, chlor 3. dm2 on metformin will get fasing labs with [facility] 4. C/o left breast lesion nipple 7-8 oclock Sunday noticed a lump new and wanted it checked out screening mammo due will do dx disc with pt she is ok with this    ROS: See pertinent positives and negatives per HPI.  Past Medical History:  Diagnosis Date  . Alcohol abuse   . Arthritis   . Cervical radiculopathy   . Diabetes mellitus without complication ([place]) 2956  . Herpes    1 and 2 since 43 y.o   . Hyperlipidemia   . Hypertension   . Morbid obesity ([place])   . OSA (obstructive sleep apnea)    Not on CPAP  . Psoriasis   . Tobacco abuse     No past surgical history on file.  Family History  Problem Relation Age of Onset  . Alcohol abuse Maternal Grandmother   . Cancer Maternal Grandmother        Ovarian  . Hypertension Maternal Grandmother   . Diabetes Maternal Grandmother   . Hypertension Mother   . Diabetes Mother   . Colon cancer Other 29  . 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  Associates Degree No children  No caffeine  Current Outpatient Medications:  .  acetaminophen (TYLENOL) 650 MG CR tablet, Take 650 mg by mouth every 8 (eight) hours as needed for pain., Disp: , Rfl:  .  albuterol (VENTOLIN HFA) 108 (90 Base) MCG/ACT inhaler, Inhale 1-2 puffs into the lungs every 4 (four) hours as needed for wheezing or shortness of breath., Disp: 18 g, Rfl: 2 .  amLODipine (NORVASC) 5 MG tablet, Take 1 tablet (5 mg total) by mouth daily., Disp: 90 tablet, Rfl: 3 .  aspirin 81 MG tablet, Take 81 mg by mouth daily., Disp: , Rfl:  .  aspirin-acetaminophen-caffeine (EXCEDRIN MIGRAINE) 250-250-65 MG tablet, Take 1 tablet by mouth every 6 (six) hours as needed for headache., Disp: , Rfl:  .  chlorthalidone (HYGROTON) 25 MG tablet, Take 1 tablet (25 mg total) by mouth daily., Disp: 90 tablet, Rfl: 3 .  Cholecalciferol 50000 units capsule, Take 1 capsule (50,000 Units total) by mouth once a week., Disp: 13 capsule, Rfl: 1 .  clotrimazole-betamethasone (LOTRISONE) cream, Apply 1 application topically 2 (two) times daily., Disp: 30 g, Rfl: 0 .  cyclobenzaprine (FLEXERIL) 5 MG tablet, Take 1 tablet (5 mg total) by  mouth daily as needed for muscle spasms., Disp: 90 tablet, Rfl: 0 .  lisinopril (PRINIVIL,ZESTRIL) 40 MG tablet, Take 1 tablet (40 mg total) by mouth daily., Disp: 90 tablet, Rfl: 1 .  metFORMIN (GLUCOPHAGE) 500 MG tablet, Bid with food, Disp: 180 tablet, Rfl: 3 .  metroNIDAZOLE (FLAGYL) 500 MG tablet, Take 1 tablet (500 mg total) by mouth 2 (two) times daily. With food, Disp: 14 tablet, Rfl: 2 .  phentermine (ADIPEX-P) 37.5 MG tablet, Take 1 tablet (37.5 mg total) by mouth daily before breakfast., Disp: 60 tablet, Rfl: 0 .  pravastatin (PRAVACHOL) 40 MG tablet, Take 1 tablet (40 mg total) by mouth daily., Disp: 90 tablet, Rfl: 3 .  valACYclovir (VALTREX) 500 MG tablet, Take 1 tablet (500 mg total) by mouth  daily. Bid x 5 days prn, Disp: 90 tablet, Rfl: 3 .  COSENTYX SENSOREADY, 300 MG, 150 MG/ML SOAJ, , Disp: , Rfl:   EXAM:  VITALS per patient if applicable:  GENERAL: alert, oriented, appears well and in no acute distress  HEENT: atraumatic, conjunttiva clear, no obvious abnormalities on inspection of external nose and ears  NECK: normal movements of the head and neck  LUNGS: on inspection no signs of respiratory distress, breathing rate appears normal, no obvious gross SOB, gasping or wheezing  CV: no obvious cyanosis  MS: moves all visible extremities without noticeable abnormality  PSYCH/NEURO: pleasant and cooperative, no obvious depression or anxiety, speech and thought processing grossly intact  ASSESSMENT AND PLAN:  Discussed the following assessment and plan:  GAD (generalized anxiety disorder) Declines meds for now disc l theanine otc, deep breathing  Consider buspar vs effexor in future and therapy   Essential hypertension - Plan: Comprehensive metabolic panel, CBC with Differential/Platelet, Lipid panel Take bp meds and monitor BP fasting labs   Vitamin D deficiency - Plan: Vitamin D (25 hydroxy)  Left breast mass - Plan: MM DIAG BREAST TOMO BILATERAL, US BREAST LTD UNI LEFT INC AXILLA  Type 2 diabetes mellitus without complication, without long-term current use of insulin (HCC) - Plan: HgB A1c Cont meds  Need to do foot exam in future   HM Flu utd  Tdap had  pna 22 had mammo neg 7/11/19ordered again see HPI  Pap 08/24/17 neg pap neg HPV  Colonoscopy consider age 5  rec healthy diet and exercise rec MMR vx health dept Consider hep B testing in future  -we discussed possible serious and likely etiologies, options for evaluation and workup, limitations of telemedicine visit vs in person visit, treatment, treatment risks and precautions. Pt prefers to treat via telemedicine empirically rather then risking or undertaking an in person visit at this moment.  Patient agrees to seek prompt in person care if worsening, new symptoms arise, or if is not improving with treatment.   I discussed the assessment and treatment plan with the patient. The patient was provided an opportunity to ask questions and all were answered. The patient agreed with the plan and demonstrated an understanding of the instructions.   The patient was advised to call back or seek an in-person evaluation if the symptoms worsen or if the condition fails to improve as anticipated.  Time spent 25 minutes Delorise Jackson, MD

## 2019-02-21 ENCOUNTER — Encounter: Payer: Self-pay | Admitting: Internal Medicine

## 2019-02-21 DIAGNOSIS — R7303 Prediabetes: Secondary | ICD-10-CM | POA: Insufficient documentation

## 2019-02-21 DIAGNOSIS — F411 Generalized anxiety disorder: Secondary | ICD-10-CM | POA: Insufficient documentation

## 2019-02-21 NOTE — Patient Instructions (Signed)
L theanine 100-200 mg at night nature made for anxiety and stress  Or Tranquil sleep stress relax brand [employer] or [employer] has this    Generalized Anxiety Disorder, Adult Generalized anxiety disorder (GAD) is a mental health disorder. People with this condition constantly worry about everyday events. Unlike normal anxiety, worry related to GAD is not triggered by a specific event. These worries also do not fade or get better with time. GAD interferes with life functions, including relationships, work, and school. GAD can vary from mild to severe. People with severe GAD can have intense waves of anxiety with physical symptoms (panic attacks). What are the causes? The exact cause of GAD is not known. What increases the risk? This condition is more likely to develop in:  Women.  People who have a family history of anxiety disorders.  People who are very shy.  People who experience very stressful life events, such as the death of a loved one.  People who have a very stressful family environment. What are the signs or symptoms? People with GAD often worry excessively about many things in their lives, such as their health and family. They may also be overly concerned about:  Doing well at work.  Being on time.  Natural disasters.  Friendships. Physical symptoms of GAD include:  Fatigue.  Muscle tension or having muscle twitches.  Trembling or feeling shaky.  Being easily startled.  Feeling like your heart is pounding or racing.  Feeling out of breath or like you cannot take a deep breath.  Having trouble falling asleep or staying asleep.  Sweating.  Nausea, diarrhea, or irritable bowel syndrome (IBS).  Headaches.  Trouble concentrating or remembering facts.  Restlessness.  Irritability. How is this diagnosed? Your health care provider can diagnose GAD based on your symptoms and medical history. You will also have a physical exam. The health care provider will  ask specific questions about your symptoms, including how severe they are, when they started, and if they come and go. Your health care provider may ask you about your use of alcohol or drugs, including prescription medicines. Your health care provider may refer you to a mental health specialist for further evaluation. Your health care provider will do a thorough examination and may perform additional tests to rule out other possible causes of your symptoms. To be diagnosed with GAD, a person must have anxiety that:  Is out of his or her control.  Affects several different aspects of his or her life, such as work and relationships.  Causes distress that makes him or her unable to take part in normal activities.  Includes at least three physical symptoms of GAD, such as restlessness, fatigue, trouble concentrating, irritability, muscle tension, or sleep problems. Before your health care provider can confirm a diagnosis of GAD, these symptoms must be present more days than they are not, and they must last for six months or longer. How is this treated? The following therapies are usually used to treat GAD:  Medicine. Antidepressant medicine is usually prescribed for long-term daily control. Antianxiety medicines may be added in severe cases, especially when panic attacks occur.  Talk therapy (psychotherapy). Certain types of talk therapy can be helpful in treating GAD by providing support, education, and guidance. Options include: ? Cognitive behavioral therapy (CBT). People learn coping skills and techniques to ease their anxiety. They learn to identify unrealistic or negative thoughts and behaviors and to replace them with positive ones. ? Acceptance and commitment therapy (ACT). This  treatment teaches people how to be mindful as a way to cope with unwanted thoughts and feelings. ? Biofeedback. This process trains you to manage your body's response (physiological response) through breathing  techniques and relaxation methods. You will work with a therapist while machines are used to monitor your physical symptoms.  Stress management techniques. These include yoga, meditation, and exercise. A mental health specialist can help determine which treatment is best for you. Some people see improvement with one type of therapy. However, other people require a combination of therapies. Follow these instructions at home:  Take over-the-counter and prescription medicines only as told by your health care provider.  Try to maintain a normal routine.  Try to anticipate stressful situations and allow extra time to manage them.  Practice any stress management or self-calming techniques as taught by your health care provider.  Do not punish yourself for setbacks or for not making progress.  Try to recognize your accomplishments, even if they are small.  Keep all follow-up visits as told by your health care provider. This is important. Contact a health care provider if:  Your symptoms do not get better.  Your symptoms get worse.  You have signs of depression, such as: ? A persistently sad, cranky, or irritable mood. ? Loss of enjoyment in activities that used to bring you joy. ? Change in weight or eating. ? Changes in sleeping habits. ? Avoiding friends or family members. ? Loss of energy for normal tasks. ? Feelings of guilt or worthlessness. Get help right away if:  You have serious thoughts about hurting yourself or others. If you ever feel like you may hurt yourself or others, or have thoughts about taking your own life, get help right away. You can go to your nearest emergency department or call:  Your local emergency services (911 in the [place]  A suicide crisis helpline, such as the National Suicide Prevention Lifeline at 647-734-6287. This is open 24 hours a day. Summary  Generalized anxiety disorder (GAD) is a mental health disorder that involves worry that is not  triggered by a specific event.  People with GAD often worry excessively about many things in their lives, such as their health and family.  GAD may cause physical symptoms such as restlessness, trouble concentrating, sleep problems, frequent sweating, nausea, diarrhea, headaches, and trembling or muscle twitching.  A mental health specialist can help determine which treatment is best for you. Some people see improvement with one type of therapy. However, other people require a combination of therapies. This information is not intended to replace advice given to you by your health care provider. Make sure you discuss any questions you have with your health care provider. Document Released: 07/16/2012 Document Revised: 03/03/2017 Document Reviewed: 02/09/2016 Elsevier Patient Education  2020 [employer]  Living With Anxiety  After being diagnosed with an anxiety disorder, you may be relieved to know why you have felt or behaved a certain way. It is natural to also feel overwhelmed about the treatment ahead and what it will mean for your life. With care and support, you can manage this condition and recover from it. How to cope with anxiety Dealing with stress Stress is your bodys reaction to life changes and events, both good and bad. Stress can last just a few hours or it can be ongoing. Stress can play a major role in anxiety, so it is important to learn both how to cope with stress and how to think about it differently. Talk  with your health care provider or a counselor to learn more about stress reduction. He or she may suggest some stress reduction techniques, such as:  Music therapy. This can include creating or listening to music that you enjoy and that inspires you.  Mindfulness-based meditation. This involves being aware of your normal breaths, rather than trying to control your breathing. It can be done while sitting or walking.  Centering prayer. This is a kind of meditation that  involves focusing on a word, phrase, or sacred image that is meaningful to you and that brings you peace.  Deep breathing. To do this, expand your stomach and inhale slowly through your nose. Hold your breath for 3-5 seconds. Then exhale slowly, allowing your stomach muscles to relax.  Self-talk. This is a skill where you identify thought patterns that lead to anxiety reactions and correct those thoughts.  Muscle relaxation. This involves tensing muscles then relaxing them. Choose a stress reduction technique that fits your lifestyle and personality. Stress reduction techniques take time and practice. Set aside 5-15 minutes a day to do them. Therapists can offer training in these techniques. The training may be covered by some insurance plans. Other things you can do to manage stress include:  Keeping a stress diary. This can help you learn what triggers your stress and ways to control your response.  Thinking about how you respond to certain situations. You may not be able to control everything, but you can control your reaction.  Making time for activities that help you relax, and not feeling guilty about spending your time in this way. Therapy combined with coping and stress-reduction skills provides the best chance for successful treatment. Medicines Medicines can help ease symptoms. Medicines for anxiety include:  Anti-anxiety drugs.  Antidepressants.  Beta-blockers. Medicines may be used as the main treatment for anxiety disorder, along with therapy, or if other treatments are not working. Medicines should be prescribed by a health care provider. Relationships Relationships can play a big part in helping you recover. Try to spend more time connecting with trusted friends and family members. Consider going to couples counseling, taking family education classes, or going to family therapy. Therapy can help you and others better understand the condition. How to recognize changes in your  condition Everyone has a different response to treatment for anxiety. Recovery from anxiety happens when symptoms decrease and stop interfering with your daily activities at home or work. This may mean that you will start to:  Have better concentration and focus.  Sleep better.  Be less irritable.  Have more energy.  Have improved memory. It is important to recognize when your condition is getting worse. Contact your health care provider if your symptoms interfere with home or work and you do not feel like your condition is improving. Where to find help and support: You can get help and support from these sources:  Self-help groups.  Online and [employer].  A trusted spiritual leader.  Couples counseling.  Family education classes.  Family therapy. Follow these instructions at home:  Eat a healthy diet that includes plenty of vegetables, fruits, whole grains, low-fat dairy products, and lean protein. Do not eat a lot of foods that are high in solid fats, added sugars, or salt.  Exercise. Most adults should do the following: ? Exercise for at least 150 minutes each week. The exercise should increase your heart rate and make you sweat (moderate-intensity exercise). ? Strengthening exercises at least twice a week.  Cut down  on caffeine, tobacco, alcohol, and other potentially harmful substances.  Get the right amount and quality of sleep. Most adults need 7-9 hours of sleep each night.  Make choices that simplify your life.  Take over-the-counter and prescription medicines only as told by your health care provider.  Avoid caffeine, alcohol, and certain over-the-counter cold medicines. These may make you feel worse. Ask your pharmacist which medicines to avoid.  Keep all follow-up visits as told by your health care provider. This is important. Questions to ask your health care provider  Would I benefit from therapy?  How often should I follow up with a  health care provider?  How long do I need to take medicine?  Are there any long-term side effects of my medicine?  Are there any alternatives to taking medicine? Contact a health care provider if:  You have a hard time staying focused or finishing daily tasks.  You spend many hours a day feeling worried about everyday life.  You become exhausted by worry.  You start to have headaches, feel tense, or have nausea.  You urinate more than normal.  You have diarrhea. Get help right away if:  You have a racing heart and shortness of breath.  You have thoughts of hurting yourself or others. If you ever feel like you may hurt yourself or others, or have thoughts about taking your own life, get help right away. You can go to your nearest emergency department or call:  Your local emergency services (911 in the [place]  A suicide crisis helpline, such as the National Suicide Prevention Lifeline at 856-272-7264. This is open 24-hours a day. Summary  Taking steps to deal with stress can help calm you.  Medicines cannot cure anxiety disorders, but they can help ease symptoms.  Family, friends, and partners can play a big part in helping you recover from an anxiety disorder. This information is not intended to replace advice given to you by your health care provider. Make sure you discuss any questions you have with your health care provider. Document Released: 03/15/2016 Document Revised: 03/03/2017 Document Reviewed: 03/15/2016 Elsevier Patient Education  2020 [employer].  Venlafaxine tablets What is this medicine? VENLAFAXINE (VEN la fax een) is used to treat depression, anxiety and panic disorder. This medicine may be used for other purposes; ask your health care provider or pharmacist if you have questions. COMMON BRAND NAME(S): Effexor What should I tell my health care provider before I take this medicine? They need to know if you have any of these conditions:  bleeding  disorders  glaucoma  heart disease  high blood pressure  high cholesterol  kidney disease  liver disease  low levels of sodium in the blood  mania or bipolar disorder  seizures  suicidal thoughts, plans, or attempt; a previous suicide attempt by you or a family  take medicines that treat or prevent blood clots  thyroid disease  an unusual or allergic reaction to venlafaxine, desvenlafaxine, other medicines, foods, dyes, or preservatives  pregnant or trying to get pregnant  breast-feeding How should I use this medicine? Take this medicine by mouth with a glass of water. Follow the directions on the prescription label. Take it with food. Take your medicine at regular intervals. Do not take your medicine more often than directed. Do not stop taking this medicine suddenly except upon the advice of your doctor. Stopping this medicine too quickly may cause serious side effects or your condition may worsen. A special MedGuide will be  given to you by the pharmacist with each prescription and refill. Be sure to read this information carefully each time. Talk to your pediatrician regarding the use of this medicine in children. Special care may be needed. Overdosage: If you think you have taken too much of this medicine contact a poison control center or emergency room at once. NOTE: This medicine is only for you. Do not share this medicine with others. What if I miss a dose? If you miss a dose, take it as soon as you can. If it is almost time for your next dose, take only that dose. Do not take double or extra doses. What may interact with this medicine? Do not take this medicine with any of the following medications:  certain medicines for fungal infections like fluconazole, itraconazole, ketoconazole, posaconazole, voriconazole  cisapride  desvenlafaxine  dronedarone  duloxetine  levomilnacipran  linezolid  MAOIs like Carbex, Eldepryl, Marplan, Nardil, and  Parnate  methylene blue (injected into a vein)  milnacipran  pimozide  thioridazine This medicine may also interact with the following medications:  amphetamines  aspirin and aspirin-like medicines  certain medicines for depression, anxiety, or psychotic disturbances  certain medicines for migraine headaches like almotriptan, eletriptan, frovatriptan, naratriptan, rizatriptan, sumatriptan, zolmitriptan  certain medicines for sleep  certain medicines that treat or prevent blood clots like dalteparin, enoxaparin, warfarin  cimetidine  clozapine  diuretics  fentanyl  furazolidone  indinavir  isoniazid  lithium  metoprolol  NSAIDS, medicines for pain and inflammation, like ibuprofen or naproxen  other medicines that prolong the QT interval (cause an abnormal heart rhythm) like dofetilide, ziprasidone  procarbazine  rasagiline  supplements like St. John's wort, kava kava, valerian  tramadol  tryptophan This list may not describe all possible interactions. Give your health care provider a list of all the medicines, herbs, non-prescription drugs, or dietary supplements you use. Also tell them if you smoke, drink alcohol, or use illegal drugs. Some items may interact with your medicine. What should I watch for while using this medicine? Tell your doctor if your symptoms do not get better or if they get worse. Visit your doctor or health care professional for regular checks on your progress. Because it may take several weeks to see the full effects of this medicine, it is important to continue your treatment as prescribed by your doctor. Patients and their families should watch out for new or worsening thoughts of suicide or depression. Also watch out for sudden changes in feelings such as feeling anxious, agitated, panicky, irritable, hostile, aggressive, impulsive, severely restless, overly excited and hyperactive, or not being able to sleep. If this happens,  especially at the beginning of treatment or after a change in dose, call your health care professional. This medicine can cause an increase in blood pressure. Check with your doctor for instructions on monitoring your blood pressure while taking this medicine. You may get drowsy or dizzy. Do not drive, use machinery, or do anything that needs mental alertness until you know how this medicine affects you. Do not stand or sit up quickly, especially if you are an older patient. This reduces the risk of dizzy or fainting spells. Alcohol may interfere with the effect of this medicine. Avoid alcoholic drinks. Your mouth may get dry. Chewing sugarless gum, sucking hard candy and drinking plenty of water will help. Contact your doctor if the problem does not go away or is severe. What side effects may I notice from receiving this medicine? Side effects that you  should report to your doctor or health care professional as soon as possible:  allergic reactions like skin rash, itching or hives, swelling of the face, lips, or tongue  anxious  breathing problems  confusion  changes in vision  chest pain  confusion  elevated mood, decreased need for sleep, racing thoughts, impulsive behavior  eye pain  fast, irregular heartbeat  feeling faint or lightheaded, falls  feeling agitated, angry, or irritable  hallucination, loss of contact with reality  high blood pressure  loss of balance or coordination  palpitations  redness, blistering, peeling or loosening of the skin, including inside the mouth  restlessness, pacing, inability to keep still  seizures  stiff muscles  suicidal thoughts or other mood changes  trouble passing urine or change in the amount of urine  trouble sleeping  unusual bleeding or bruising  unusually weak or tired  vomiting Side effects that usually do not require medical attention (report to your doctor or health care professional if they continue or are  bothersome):  change in sex drive or performance  change in appetite or weight  constipation  dizziness  dry mouth  headache  increased sweating  nausea  tired This list may not describe all possible side effects. Call your doctor for medical advice about side effects. You may report side effects to FDA at 1-800-FDA-1088. Where should I keep my medicine? Keep out of the reach of children. Store at a controlled temperature between 20 and 25 degrees C (68 and 77 degrees F), in a dry place. Throw away any unused medicine after the expiration date. NOTE: This sheet is a summary. It may not cover all possible information. If you have questions about this medicine, talk to your doctor, pharmacist, or health care provider.  2020 Elsevier/Gold Standard (2018-03-13 12:08:23)  Buspirone tablets What is this medicine? BUSPIRONE (byoo SPYE rone) is used to treat anxiety disorders. This medicine may be used for other purposes; ask your health care provider or pharmacist if you have questions. COMMON BRAND NAME(S): BuSpar What should I tell my health care provider before I take this medicine? They need to know if you have any of these conditions:  kidney or liver disease  an unusual or allergic reaction to buspirone, other medicines, foods, dyes, or preservatives  pregnant or trying to get pregnant  breast-feeding How should I use this medicine? Take this medicine by mouth with a glass of water. Follow the directions on the prescription label. You may take this medicine with or without food. To ensure that this medicine always works the same way for you, you should take it either always with or always without food. Take your doses at regular intervals. Do not take your medicine more often than directed. Do not stop taking except on the advice of your doctor or health care professional. Talk to your pediatrician regarding the use of this medicine in children. Special care may be  needed. Overdosage: If you think you have taken too much of this medicine contact a poison control center or emergency room at once. NOTE: This medicine is only for you. Do not share this medicine with others. What if I miss a dose? If you miss a dose, take it as soon as you can. If it is almost time for your next dose, take only that dose. Do not take double or extra doses. What may interact with this medicine? Do not take this medicine with any of the following medications:  linezolid  MAOIs like  Carbex, Eldepryl, Marplan, Nardil, and Parnate  methylene blue  procarbazine This medicine may also interact with the following medications:  diazepam  digoxin  diltiazem  erythromycin  grapefruit juice  haloperidol  medicines for mental depression or mood problems  medicines for seizures like carbamazepine, phenobarbital and phenytoin  nefazodone  other medications for anxiety  rifampin  ritonavir  some antifungal medicines like itraconazole, ketoconazole, and voriconazole  verapamil  warfarin This list may not describe all possible interactions. Give your health care provider a list of all the medicines, herbs, non-prescription drugs, or dietary supplements you use. Also tell them if you smoke, drink alcohol, or use illegal drugs. Some items may interact with your medicine. What should I watch for while using this medicine? Visit your doctor or health care professional for regular checks on your progress. It may take 1 to 2 weeks before your anxiety gets better. You may get drowsy or dizzy. Do not drive, use machinery, or do anything that needs mental alertness until you know how this drug affects you. Do not stand or sit up quickly, especially if you are an older patient. This reduces the risk of dizzy or fainting spells. Alcohol can make you more drowsy and dizzy. Avoid alcoholic drinks. What side effects may I notice from receiving this medicine? Side effects that  you should report to your doctor or health care professional as soon as possible:  blurred vision or other vision changes  chest pain  confusion  difficulty breathing  feelings of hostility or anger  muscle aches and pains  numbness or tingling in hands or feet  ringing in the ears  skin rash and itching  vomiting  weakness Side effects that usually do not require medical attention (report to your doctor or health care professional if they continue or are bothersome):  disturbed dreams, nightmares  headache  nausea  restlessness or nervousness  sore throat and nasal congestion  stomach upset This list may not describe all possible side effects. Call your doctor for medical advice about side effects. You may report side effects to FDA at 1-800-FDA-1088. Where should I keep my medicine? Keep out of the reach of children. Store at room temperature below 30 degrees C (86 degrees F). Protect from light. Keep container tightly closed. Throw away any unused medicine after the expiration date. NOTE: This sheet is a summary. It may not cover all possible information. If you have questions about this medicine, talk to your doctor, pharmacist, or health care provider.  2020 Elsevier/Gold Standard (2009-10-29 18:06:11)

## 2019-03-04 ENCOUNTER — Ambulatory Visit
Admission: RE | Admit: 2019-03-04 | Discharge: 2019-03-04 | Disposition: A | Discharge status: Home / Self Care | Payer: Managed Care, Other (non HMO) | Source: Ambulatory Visit | Attending: Internal Medicine | Admitting: Internal Medicine

## 2019-03-04 DIAGNOSIS — N6320 Unspecified lump in the left breast, unspecified quadrant: Secondary | ICD-10-CM

## 2019-03-05 ENCOUNTER — Other Ambulatory Visit: Payer: Self-pay | Admitting: Internal Medicine

## 2019-03-05 DIAGNOSIS — N6310 Unspecified lump in the right breast, unspecified quadrant: Secondary | ICD-10-CM

## 2019-03-05 DIAGNOSIS — R928 Other abnormal and inconclusive findings on diagnostic imaging of breast: Secondary | ICD-10-CM

## 2019-03-06 ENCOUNTER — Other Ambulatory Visit: Payer: Self-pay | Admitting: Internal Medicine

## 2019-03-06 DIAGNOSIS — N6320 Unspecified lump in the left breast, unspecified quadrant: Secondary | ICD-10-CM

## 2019-03-06 DIAGNOSIS — R928 Other abnormal and inconclusive findings on diagnostic imaging of breast: Secondary | ICD-10-CM

## 2019-03-08 ENCOUNTER — Ambulatory Visit
Admission: RE | Admit: 2019-03-08 | Discharge: 2019-03-08 | Disposition: A | Discharge status: Home / Self Care | Payer: Managed Care, Other (non HMO) | Source: Ambulatory Visit | Attending: Internal Medicine | Admitting: Internal Medicine

## 2019-03-08 ENCOUNTER — Other Ambulatory Visit: Payer: Self-pay

## 2019-03-08 DIAGNOSIS — N6320 Unspecified lump in the left breast, unspecified quadrant: Secondary | ICD-10-CM

## 2019-03-08 DIAGNOSIS — R928 Other abnormal and inconclusive findings on diagnostic imaging of breast: Secondary | ICD-10-CM

## 2019-03-08 DIAGNOSIS — Z20822 Contact with and (suspected) exposure to covid-19: Secondary | ICD-10-CM

## 2019-03-08 NOTE — Progress Notes (Unsigned)
nov 

## 2019-03-09 LAB — NOVEL CORONAVIRUS, NAA: SARS-CoV-2, NAA: NOT DETECTED

## 2019-03-11 LAB — SURGICAL PATHOLOGY

## 2019-03-15 ENCOUNTER — Encounter: Payer: Self-pay | Admitting: Internal Medicine

## 2019-03-15 ENCOUNTER — Other Ambulatory Visit: Payer: Self-pay

## 2019-03-15 ENCOUNTER — Ambulatory Visit (INDEPENDENT_AMBULATORY_CARE_PROVIDER_SITE_OTHER): Payer: Managed Care, Other (non HMO) | Admitting: Internal Medicine

## 2019-03-15 VITALS — BP 160/108 | Ht 64.0 in | Wt 318.0 lb

## 2019-03-15 DIAGNOSIS — B009 Herpesviral infection, unspecified: Secondary | ICD-10-CM

## 2019-03-15 DIAGNOSIS — E785 Hyperlipidemia, unspecified: Secondary | ICD-10-CM

## 2019-03-15 DIAGNOSIS — I10 Essential (primary) hypertension: Secondary | ICD-10-CM

## 2019-03-15 DIAGNOSIS — Z9989 Dependence on other enabling machines and devices: Secondary | ICD-10-CM

## 2019-03-15 DIAGNOSIS — G8929 Other chronic pain: Secondary | ICD-10-CM

## 2019-03-15 DIAGNOSIS — M79642 Pain in left hand: Secondary | ICD-10-CM

## 2019-03-15 DIAGNOSIS — G4733 Obstructive sleep apnea (adult) (pediatric): Secondary | ICD-10-CM

## 2019-03-15 DIAGNOSIS — M79645 Pain in left finger(s): Secondary | ICD-10-CM

## 2019-03-15 DIAGNOSIS — F411 Generalized anxiety disorder: Secondary | ICD-10-CM

## 2019-03-15 MED ORDER — PRAVASTATIN SODIUM 40 MG PO TABS: 40.0000 mg | ORAL_TABLET | Freq: Every day | ORAL | 3 refills | Status: DC

## 2019-03-15 MED ORDER — VALACYCLOVIR HCL 500 MG PO TABS: 500.0000 mg | ORAL_TABLET | Freq: Every day | ORAL | 3 refills | Status: DC

## 2019-03-15 MED ORDER — LISINOPRIL 40 MG PO TABS: 40.0000 mg | ORAL_TABLET | Freq: Every day | ORAL | 3 refills | Status: DC

## 2019-03-15 MED ORDER — PREDNISONE 10 MG PO TABS: 10.0000 mg | ORAL_TABLET | Freq: Every day | ORAL | 0 refills | Status: DC

## 2019-03-15 MED ORDER — CHLORTHALIDONE 25 MG PO TABS: 25.0000 mg | ORAL_TABLET | Freq: Every day | ORAL | 3 refills | Status: DC

## 2019-03-15 MED ORDER — AMLODIPINE BESYLATE 5 MG PO TABS: 5.0000 mg | ORAL_TABLET | Freq: Every day | ORAL | 3 refills | Status: DC

## 2019-03-15 NOTE — Patient Instructions (Signed)
De Quervain's Tenosynovitis  De Quervain's tenosynovitis is a condition that causes inflammation of the tendon on the thumb side of the wrist. Tendons are cords of tissue that connect bones to muscles. The tendons in the hand pass through a tunnel called a sheath. A slippery layer of tissue (synovium) lets the tendons move smoothly in the sheath. With de Quervain's tenosynovitis, the sheath swells or thickens, causing friction and pain. The condition is also called de Quervain's disease and de Quervain's syndrome. It occurs most often in women who are 30-50 years old. What are the causes? The exact cause of this condition is not known. It may be associated with overuse of the hand and wrist. What increases the risk? You are more likely to develop this condition if you:  Use your hands far more than normal, especially if you repeat certain movements that involve twisting your hand or using a tight grip.  Are pregnant.  Are a middle-aged woman.  Have rheumatoid arthritis.  Have diabetes. What are the signs or symptoms? The main symptom of this condition is pain on the thumb side of the wrist. The pain may get worse when you grasp something or turn your wrist. Other symptoms may include:  Pain that extends up the forearm.  Swelling of your wrist and hand.  Trouble moving the thumb and wrist.  A sensation of snapping in the wrist.  A bump filled with fluid (cyst) in the area of the pain. How is this diagnosed? This condition may be diagnosed based on:  Your symptoms and medical history.  A physical exam. During the exam, your health care provider may do a simple test (Finkelstein test) that involves pulling your thumb and wrist to see if this causes pain. You may also need to have an X-ray. How is this treated? Treatment for this condition may include:  Avoiding any activity that causes pain and swelling.  Taking medicines. Anti-inflammatory medicines and corticosteroid  injections may be used to reduce inflammation and relieve pain.  Wearing a splint.  Having surgery. This may be needed if other treatments do not work. Once the pain and swelling has gone down:  Physical therapy. This includes stretching and strengthening exercises.  Occupational therapy. This includes adjusting how you move your wrist. Follow these instructions at home: If you have a splint:  Wear the splint as told by your health care provider. Remove it only as told by your health care provider.  Loosen the splint if your fingers tingle, become numb, or turn cold and blue.  Keep the splint clean.  If the splint is not waterproof: ? Do not let it get wet. ? Cover it with a watertight covering when you take a bath or a shower. Managing pain, stiffness, and swelling   Avoid movements and activities that cause pain and swelling in the wrist area.  If directed, put ice on the painful area. This may be helpful after doing activities that involve the sore wrist. ? Put ice in a plastic bag. ? Place a towel between your skin and the bag. ? Leave the ice on for 20 minutes, 2-3 times a day.  Move your fingers often to avoid stiffness and to lessen swelling.  Raise (elevate) the injured area above the level of your heart while you are sitting or lying down. General instructions  Return to your normal activities as told by your health care provider. Ask your health care provider what activities are safe for you.  Take over-the-counter   and prescription medicines only as told by your health care provider.  Keep all follow-up visits as told by your health care provider. This is important. Contact a health care provider if:  Your pain medicine does not help.  Your pain gets worse.  You develop new symptoms. Summary  De Quervain's tenosynovitis is a condition that causes inflammation of the tendon on the thumb side of the wrist.  The condition occurs most often in women who are  30-50 years old.  The exact cause of this condition is not known. It may be associated with overuse of the hand and wrist.  Treatment starts with avoiding activity that causes pain or swelling in the wrist area. Other treatment may include wearing a splint and taking medicine. Sometimes, surgery is needed. This information is not intended to replace advice given to you by your health care provider. Make sure you discuss any questions you have with your health care provider. Document Released: 12/14/2000 Document Revised: 09/21/2017 Document Reviewed: 02/27/2017 Elsevier Patient Education  2020 Elsevier Inc.  

## 2019-03-15 NOTE — Progress Notes (Addendum)
Virtual Visit via Video Note  I connected with Nicole Mendez  on 03/15/19 at 10:11 AM EST by a video enabled telemedicine application and verified that I am speaking with the correct person using two identifiers.  Location patient: home Location provider:work or home office Persons participating in the virtual visit: patient, provider  I discussed the limitations of evaluation and management by telemedicine and the availability of in person appointments. The patient expressed understanding and agreed to proceed.   HPI: 1. Left hand/thumb pain chronic but worse over the last 2 weeks with clicking and feels like internal popping and swelling, reduced ROM and mobility only tried Tylenol and reduced use which has helped some but not a lot.  Wants Xray of left hand for now 2. HTN elevated only taking lis 40 gm qd and chlorthalidone 25 mg qd  3. Anxiety better since covid 19 resolved and able to go out when needed    ROS: See pertinent positives and negatives per HPI.  Past Medical History:  Diagnosis Date  . Alcohol abuse   . Arthritis   . Cervical radiculopathy   . Diabetes mellitus without complication ([place]) 8115  . Herpes    1 and 2 since 43 y.o   . Hyperlipidemia   . Hypertension   . Morbid obesity ([place])   . OSA (obstructive sleep apnea)    Not on CPAP  . Psoriasis   . Tobacco abuse     No past surgical history on file.  Family History  Problem Relation Age of Onset  . Alcohol abuse Maternal Grandmother   . Cancer Maternal Grandmother        Ovarian  . Hypertension Maternal Grandmother   . Diabetes Maternal Grandmother   . Hypertension Mother   . Diabetes Mother   . Colon cancer Other 75  . Breast cancer Other        + FH    SOCIAL HX:  Lives alone   Current Outpatient Medications:  .  acetaminophen (TYLENOL) 650 MG CR tablet, Take 650 mg by mouth every 8 (eight) hours as needed for pain., Disp: , Rfl:  .  albuterol (VENTOLIN HFA) 108 (90 Base) MCG/ACT  inhaler, Inhale 1-2 puffs into the lungs every 4 (four) hours as needed for wheezing or shortness of breath., Disp: 18 g, Rfl: 2 .  amLODipine (NORVASC) 5 MG tablet, Take 1 tablet (5 mg total) by mouth daily., Disp: 90 tablet, Rfl: 3 .  aspirin 81 MG tablet, Take 81 mg by mouth daily., Disp: , Rfl:  .  aspirin-acetaminophen-caffeine (EXCEDRIN MIGRAINE) 250-250-65 MG tablet, Take 1 tablet by mouth every 6 (six) hours as needed for headache., Disp: , Rfl:  .  chlorthalidone (HYGROTON) 25 MG tablet, Take 1 tablet (25 mg total) by mouth daily., Disp: 90 tablet, Rfl: 3 .  Cholecalciferol 50000 units capsule, Take 1 capsule (50,000 Units total) by mouth once a week., Disp: 13 capsule, Rfl: 1 .  clotrimazole-betamethasone (LOTRISONE) cream, Apply 1 application topically 2 (two) times daily., Disp: 30 g, Rfl: 0 .  COSENTYX SENSOREADY, 300 MG, 150 MG/ML SOAJ, , Disp: , Rfl:  .  cyclobenzaprine (FLEXERIL) 5 MG tablet, Take 1 tablet (5 mg total) by mouth daily as needed for muscle spasms., Disp: 90 tablet, Rfl: 0 .  lisinopril (ZESTRIL) 40 MG tablet, Take 1 tablet (40 mg total) by mouth daily., Disp: 90 tablet, Rfl: 3 .  metFORMIN (GLUCOPHAGE) 500 MG tablet, Bid with food, Disp: 180 tablet, Rfl: 3 .  metroNIDAZOLE (FLAGYL) 500 MG tablet, Take 1 tablet (500 mg total) by mouth 2 (two) times daily. With food, Disp: 14 tablet, Rfl: 2 .  pravastatin (PRAVACHOL) 40 MG tablet, Take 1 tablet (40 mg total) by mouth daily., Disp: 90 tablet, Rfl: 3 .  valACYclovir (VALTREX) 500 MG tablet, Take 1 tablet (500 mg total) by mouth daily. Bid x 5 days prn, Disp: 90 tablet, Rfl: 3 .  predniSONE (DELTASONE) 10 MG tablet, Take 1 tablet (10 mg total) by mouth daily with breakfast., Disp: 7 tablet, Rfl: 0  EXAM:  VITALS per patient if applicable:  GENERAL: alert, oriented, appears well and in no acute distress  HEENT: atraumatic, conjunttiva clear, no obvious abnormalities on inspection of external nose and ears  NECK:  normal movements of the head and neck  LUNGS: on inspection no signs of respiratory distress, breathing rate appears normal, no obvious gross SOB, gasping or wheezing  CV: no obvious cyanosis  MS: moves all visible extremities without noticeable abnormality  PSYCH/NEURO: pleasant and cooperative, no obvious depression or anxiety, speech and thought processing grossly intact  ASSESSMENT AND PLAN:  Discussed the following assessment and plan:  Left hand/thumb pain c/w DQ tenosynovitis - Plan: predniSONE (DELTASONE) 10 MG tablet, DG Finger Thumb Left rec [facility] in future steroid injection further w/u if not resolved for now above  Essential hypertension - Plan: amLODipine (NORVASC) 5 MG tablet, chlorthalidone (HYGROTON) 25 MG tablet, lisinopril (ZESTRIL) 40 MG tablet  Hyperlipidemia, unspecified hyperlipidemia type - Plan: pravastatin (PRAVACHOL) 40 MG tablet  Herpes infection - Plan: valACYclovir (VALTREX) 500 MG tablet  GAD (generalized anxiety disorder) improved no need for med  OSA on cpap  Does not feel like settings accurate  -agreeable to see pulm for this  Having daytime fatigue   HM Fluutd Tdap had  pna 22 had mammo 03/04/19 with bx negative  Pap 08/24/17 neg pap neg HPV Colonoscopy consider age 66  rec healthy diet and exercise rec MMR vx health dept Consider hep B testing in future  -we discussed possible serious and likely etiologies, options for evaluation and workup, limitations of telemedicine visit vs in person visit, treatment, treatment risks and precautions. Pt prefers to treat via telemedicine empirically rather then risking or undertaking an in person visit at this moment. Patient agrees to seek prompt in person care if worsening, new symptoms arise, or if is not improving with treatment.   I discussed the assessment and treatment plan with the patient. The patient was provided an opportunity to ask questions and all were answered. The patient  agreed with the plan and demonstrated an understanding of the instructions.   The patient was advised to call back or seek an in-person evaluation if the symptoms worsen or if the condition fails to improve as anticipated.  Time spent 15 minutes  Delorise Jackson, MD

## 2019-03-26 ENCOUNTER — Other Ambulatory Visit: Payer: Self-pay | Admitting: Internal Medicine

## 2019-03-26 DIAGNOSIS — I10 Essential (primary) hypertension: Secondary | ICD-10-CM

## 2019-03-26 MED ORDER — CHLORTHALIDONE 25 MG PO TABS: 25.0000 mg | ORAL_TABLET | Freq: Every day | ORAL | 3 refills | Status: DC

## 2019-04-30 ENCOUNTER — Ambulatory Visit: Payer: Managed Care, Other (non HMO) | Attending: Internal Medicine

## 2019-04-30 DIAGNOSIS — Z20822 Contact with and (suspected) exposure to covid-19: Secondary | ICD-10-CM

## 2019-05-01 LAB — NOVEL CORONAVIRUS, NAA: SARS-CoV-2, NAA: NOT DETECTED

## 2019-05-31 ENCOUNTER — Other Ambulatory Visit: Payer: Self-pay

## 2019-05-31 ENCOUNTER — Encounter: Payer: Self-pay | Admitting: Internal Medicine

## 2019-05-31 ENCOUNTER — Telehealth (INDEPENDENT_AMBULATORY_CARE_PROVIDER_SITE_OTHER): Payer: Managed Care, Other (non HMO) | Admitting: Internal Medicine

## 2019-05-31 VITALS — BP 140/80 | Ht 64.0 in | Wt 318.0 lb

## 2019-05-31 DIAGNOSIS — M65312 Trigger thumb, left thumb: Secondary | ICD-10-CM | POA: Insufficient documentation

## 2019-05-31 DIAGNOSIS — I10 Essential (primary) hypertension: Secondary | ICD-10-CM | POA: Diagnosis not present

## 2019-05-31 DIAGNOSIS — N61 Mastitis without abscess: Secondary | ICD-10-CM | POA: Diagnosis not present

## 2019-05-31 MED ORDER — METOPROLOL SUCCINATE ER 25 MG PO TB24: 25.0000 mg | ORAL_TABLET | Freq: Every day | ORAL | 3 refills | Status: DC

## 2019-05-31 NOTE — Progress Notes (Signed)
Virtual Visit via Video Note  I connected with Nicole Mendez   on 05/31/19 at 10:50 AM EST by a video enabled telemedicine application and verified that I am speaking with the correct person using two identifiers.  Location patient: home Location provider:work or home office Persons participating in the virtual visit: patient, provider  I discussed the limitations of evaluation and management by telemedicine and the availability of in person appointments. The patient expressed understanding and agreed to proceed.   HPI: 1. Trigger finger steroid at [facility] helped left them and they did Xrays  2. HTN BP 136/76 this am repeat no meds yet normally running 140-160 sbp  On norvasc 5 mg  Chlorthalidone 25 mg qam  Lis 40 mg qd no angioedema side effects  BP still elevated  3. Morbid obesity with FH wants referral to bariatric surgery in the futur e 4. covid vx 1/2 sch today  5. S/p left breast bx 03/08/19 benign findings but area is read and warm and swollen s/p bx which is new pt ants medication to tx   ROS: See pertinent positives and negatives per HPI.  Past Medical History:  Diagnosis Date  . Alcohol abuse   . Arthritis   . Cervical radiculopathy   . Diabetes mellitus without complication ([place]) 3825  . Herpes    1 and 2 since 44 y.o   . Hyperlipidemia   . Hypertension   . Morbid obesity ([place])   . OSA (obstructive sleep apnea)    Not on CPAP  . Psoriasis   . Tobacco abuse     No past surgical history on file.  Family History  Problem Relation Age of Onset  . Alcohol abuse Maternal Grandmother   . Cancer Maternal Grandmother        Ovarian  . Hypertension Maternal Grandmother   . Diabetes Maternal Grandmother   . Hypertension Mother   . Diabetes Mother   . Colon cancer Other 24  . Breast cancer Other        + FH  . Aneurysm Maternal Grandfather   . Alcohol abuse Maternal Grandfather     SOCIAL HX: works [employer]  Lives alone   Current Outpatient  Medications:  .  acetaminophen (TYLENOL) 650 MG CR tablet, Take 650 mg by mouth every 8 (eight) hours as needed for pain., Disp: , Rfl:  .  amLODipine (NORVASC) 5 MG tablet, Take 1 tablet (5 mg total) by mouth daily., Disp: 90 tablet, Rfl: 3 .  aspirin 81 MG tablet, Take 81 mg by mouth daily., Disp: , Rfl:  .  aspirin-acetaminophen-caffeine (EXCEDRIN MIGRAINE) 250-250-65 MG tablet, Take 1 tablet by mouth every 6 (six) hours as needed for headache., Disp: , Rfl:  .  chlorthalidone (HYGROTON) 25 MG tablet, Take 1 tablet (25 mg total) by mouth daily., Disp: 90 tablet, Rfl: 3 .  Cholecalciferol 50000 units capsule, Take 1 capsule (50,000 Units total) by mouth once a week., Disp: 13 capsule, Rfl: 1 .  COSENTYX SENSOREADY, 300 MG, 150 MG/ML SOAJ, , Disp: , Rfl:  .  lisinopril (ZESTRIL) 40 MG tablet, Take 1 tablet (40 mg total) by mouth daily., Disp: 90 tablet, Rfl: 3 .  metFORMIN (GLUCOPHAGE) 500 MG tablet, Bid with food, Disp: 180 tablet, Rfl: 3 .  pravastatin (PRAVACHOL) 40 MG tablet, Take 1 tablet (40 mg total) by mouth daily., Disp: 90 tablet, Rfl: 3 .  albuterol (VENTOLIN HFA) 108 (90 Base) MCG/ACT inhaler, Inhale 1-2 puffs into the lungs every 4 (  four) hours as needed for wheezing or shortness of breath. (Patient not taking: Reported on 05/31/2019), Disp: 18 g, Rfl: 2 .  clotrimazole-betamethasone (LOTRISONE) cream, Apply 1 application topically 2 (two) times daily. (Patient not taking: Reported on 05/31/2019), Disp: 30 g, Rfl: 0 .  cyclobenzaprine (FLEXERIL) 5 MG tablet, Take 1 tablet (5 mg total) by mouth daily as needed for muscle spasms. (Patient not taking: Reported on 05/31/2019), Disp: 90 tablet, Rfl: 0 .  doxycycline (VIBRA-TABS) 100 MG tablet, Take 1 tablet (100 mg total) by mouth 2 (two) times daily. With food, Disp: 20 tablet, Rfl: 0 .  metoprolol succinate (TOPROL-XL) 25 MG 24 hr tablet, Take 1 tablet (25 mg total) by mouth daily. At night, Disp: 90 tablet, Rfl: 3 .  metroNIDAZOLE (FLAGYL)  500 MG tablet, Take 1 tablet (500 mg total) by mouth 2 (two) times daily. With food (Patient not taking: Reported on 05/31/2019), Disp: 14 tablet, Rfl: 2 .  mupirocin ointment (BACTROBAN) 2 %, Apply 1 application topically 2 (two) times daily. Breast, Disp: 30 g, Rfl: 2 .  predniSONE (DELTASONE) 10 MG tablet, Take 1 tablet (10 mg total) by mouth daily with breakfast. (Patient not taking: Reported on 05/31/2019), Disp: 7 tablet, Rfl: 0 .  valACYclovir (VALTREX) 500 MG tablet, Take 1 tablet (500 mg total) by mouth daily. Bid x 5 days prn (Patient not taking: Reported on 05/31/2019), Disp: 90 tablet, Rfl: 3  EXAM:  VITALS per patient if applicable:  GENERAL: alert, oriented, appears well and in no acute distress  HEENT: atraumatic, conjunttiva clear, no obvious abnormalities on inspection of external nose and ears  NECK: normal movements of the head and neck  LUNGS: on inspection no signs of respiratory distress, breathing rate appears normal, no obvious gross SOB, gasping or wheezing  CV: no obvious cyanosis  MS: moves all visible extremities without noticeable abnormality  PSYCH/NEURO: pleasant and cooperative, no obvious depression or anxiety, speech and thought processing grossly intact  Skin: cellulitis left breast   ASSESSMENT AND PLAN:  Discussed the following assessment and plan:  Essential hypertension - Plan: metoprolol succinate (TOPROL-XL) 25 MG 24 hr tablet add to other meds  Cont other meds  Warned about angioedema with acei Needs to get fasting labs [facility] ordered since 02/2019   Trigger finger of left thumb with arthritis  F/u prn [facility]   Morbid obesity ([place]) Pt considering bariatric surgery   Cellulitis of left breast Doxycycline and bactroban bx was 03/07/20 and has cellulitis   HM Fluutd Tdap had  pna 22 had covid 1/2 05/31/19 h/o covid 19 in 2020   mammo 03/04/19 with bx negative 03/08/19 Pap 08/24/17 neg pap neg HPV Colonoscopy consider age  69 rec healthy diet and exercise rec MMR vx health dept Consider hep B testing in future with labs add on when pt get labs   [facility] 05/30/19 visit stage 3 basal jt arthritis left thumb significant osteophyte formation with left trigger finger given steroid injections  Dr. Peggye Ley  -we discussed possible serious and likely etiologies, options for evaluation and workup, limitations of telemedicine visit vs in person visit, treatment, treatment risks and precautions. Pt prefers to treat via telemedicine empirically rather then risking or undertaking an in person visit at this moment. Patient agrees to seek prompt in person care if worsening, new symptoms arise, or if is not improving with treatment.   I discussed the assessment and treatment plan with the patient. The patient was provided an opportunity to ask  questions and all were answered. The patient agreed with the plan and demonstrated an understanding of the instructions.   The patient was advised to call back or seek an in-person evaluation if the symptoms worsen or if the condition fails to improve as anticipated.  Time spent 20-29 minutes  Delorise Jackson, MD

## 2019-06-05 ENCOUNTER — Encounter: Payer: Self-pay | Admitting: Internal Medicine

## 2019-06-05 NOTE — Progress Notes (Signed)
Office note from 05/30/19 visit placed on Dr Shauna Hugh desk.

## 2019-06-10 ENCOUNTER — Other Ambulatory Visit: Payer: Self-pay | Admitting: Internal Medicine

## 2019-06-14 ENCOUNTER — Encounter: Payer: Self-pay | Admitting: Internal Medicine

## 2019-06-18 ENCOUNTER — Encounter: Payer: Self-pay | Admitting: Internal Medicine

## 2019-06-19 ENCOUNTER — Other Ambulatory Visit: Payer: Self-pay | Admitting: Internal Medicine

## 2019-06-19 DIAGNOSIS — L0390 Cellulitis, unspecified: Secondary | ICD-10-CM

## 2019-06-19 DIAGNOSIS — L03313 Cellulitis of chest wall: Secondary | ICD-10-CM

## 2019-06-19 MED ORDER — MUPIROCIN 2 % EX OINT: 1.0000 "application " | TOPICAL_OINTMENT | Freq: Two times a day (BID) | CUTANEOUS | 2 refills | Status: DC

## 2019-06-19 MED ORDER — DOXYCYCLINE HYCLATE 100 MG PO TABS: 100.0000 mg | ORAL_TABLET | Freq: Two times a day (BID) | ORAL | 0 refills | Status: DC

## 2019-06-19 NOTE — Addendum Note (Signed)
Addended by: Quentin Ore on: 06/19/2019 03:45 PM   Modules accepted: Orders

## 2019-07-08 ENCOUNTER — Encounter: Payer: Self-pay | Admitting: Internal Medicine

## 2019-07-22 ENCOUNTER — Institutional Professional Consult (permissible substitution): Payer: Managed Care, Other (non HMO) | Admitting: Acute Care

## 2019-07-22 NOTE — Progress Notes (Deleted)
History of Present Illness Nicole Mendez is a 44 y.o. female with history of HTN, Hyperlipidemia and BMI of 54.58. She presents with complaints of daytime sleepiness. She has known OSA, but is having issues with the pressures settings.  States they are uncomfortable.She presents to the [place] office for a sleep consult/ further evaluation after referral from her PCP. She will be followed by Dr. Belia Heman.   07/22/2019  Pt. Presents for sleep consult. She has known OSA, and is wearing CPAP, however complained to her PCP that the pressures were uncomfortable. She was agreeable to be seen by pulmonary to better evaluate. She has been having complaints of daytime sleepiness.   Test Results:  CBC Latest Ref Rng & Units 08/24/2017 08/24/2017 08/19/2016  WBC 3.4 - 10.8 x10E3/uL 6.6 CANCELED 6.3  Hemoglobin 11.1 - 15.9 g/dL 86.5 - 10.7(L)  Hematocrit 34.0 - 46.6 % 37.4 - 34.3  Platelets 150 - 450 x10E3/uL 470(H) - 533(H)    BMP Latest Ref Rng & Units 08/24/2017 12/27/2016 08/19/2016  Glucose 65 - 99 mg/dL 98 784(O) 93  BUN 6 - 24 mg/dL 7 6 10   Creatinine 0.57 - 1.00 mg/dL ) 9.62(X) 5.28(U)  BUN/Creat Ratio 9 - 23 15 11 18   Sodium 134 - 144 mmol/L 143 139 140  Potassium 3.5 - 5.2 mmol/L 4.1 4.1 4.1  Chloride 96 - 106 mmol/L 102 99 102  CO2 20 - 29 mmol/L 25 24 25   Calcium 8.7 - 10.2 mg/dL 9.0 1.32(G) )    BNP No results found for: BNP  ProBNP No results found for: PROBNP  PFT No results found for: FEV1PRE, FEV1POST, FVCPRE, FVCPOST, TLC, DLCOUNC, PREFEV1FVCRT, PSTFEV1FVCRT  No results found.   Past medical hx Past Medical History:  Diagnosis Date  . Alcohol abuse   . Arthritis   . Cervical radiculopathy   . Diabetes mellitus without complication (HCC) 2000  . Herpes    1 and 2 since 44 y.o   . Hyperlipidemia   . Hypertension   . Morbid obesity (HCC)   . OSA (obstructive sleep apnea)    Not on CPAP  . Psoriasis   . Tobacco abuse      Social History    Tobacco Use  . Smoking status: Former Smoker    Packs/day: 0.50    Types: Cigarettes  . Smokeless tobacco: Never Used  . Tobacco comment: stopped 11/2015  Substance Use Topics  . Alcohol use: Yes    Alcohol/week: 0.0 standard drinks    Comment: occasionally  . Drug use: No    Nicole Mendez reports that she has quit smoking. Her smoking use included cigarettes. She smoked 0.50 packs per day. She has never used smokeless tobacco. She reports current alcohol use. She reports that she does not use drugs.  Tobacco Cessation: Counseling given: Not Answered Comment: stopped 11/2015   Past surgical hx, Family hx, Social hx all reviewed.  Current Outpatient Medications on File Prior to Visit  Medication Sig  . acetaminophen (TYLENOL) 650 MG CR tablet Take 650 mg by mouth every 8 (eight) hours as needed for pain.  12/2015 albuterol (VENTOLIN HFA) 108 (90 Base) MCG/ACT inhaler Inhale 1-2 puffs into the lungs every 4 (four) hours as needed for wheezing or shortness of breath. (Patient not taking: Reported on 05/31/2019)  . amLODipine (NORVASC) 5 MG tablet Take 1 tablet (5 mg total) by mouth daily.  12/2015 aspirin 81 MG tablet Take 81 mg by mouth daily.  Marland Kitchen aspirin-acetaminophen-caffeine (EXCEDRIN MIGRAINE) [phone]  MG tablet Take 1 tablet by mouth every 6 (six) hours as needed for headache.  . chlorthalidone (HYGROTON) 25 MG tablet Take 1 tablet (25 mg total) by mouth daily.  . Cholecalciferol 50000 units capsule Take 1 capsule (50,000 Units total) by mouth once a week.  . clotrimazole-betamethasone (LOTRISONE) cream Apply 1 application topically 2 (two) times daily. (Patient not taking: Reported on 05/31/2019)  . COSENTYX SENSOREADY, 300 MG, 150 MG/ML SOAJ   . cyclobenzaprine (FLEXERIL) 5 MG tablet Take 1 tablet (5 mg total) by mouth daily as needed for muscle spasms. (Patient not taking: Reported on 05/31/2019)  . doxycycline (VIBRA-TABS) 100 MG tablet Take 1 tablet (100 mg total) by mouth 2 (two) times  daily. With food  . lisinopril (ZESTRIL) 40 MG tablet Take 1 tablet (40 mg total) by mouth daily.  . metFORMIN (GLUCOPHAGE) 500 MG tablet Bid with food  . metoprolol succinate (TOPROL-XL) 25 MG 24 hr tablet Take 1 tablet (25 mg total) by mouth daily. At night  . metroNIDAZOLE (FLAGYL) 500 MG tablet Take 1 tablet (500 mg total) by mouth 2 (two) times daily. With food (Patient not taking: Reported on 05/31/2019)  . mupirocin ointment (BACTROBAN) 2 % Apply 1 application topically 2 (two) times daily. Breast  . pravastatin (PRAVACHOL) 40 MG tablet Take 1 tablet (40 mg total) by mouth daily.  . predniSONE (DELTASONE) 10 MG tablet Take 1 tablet (10 mg total) by mouth daily with breakfast. (Patient not taking: Reported on 05/31/2019)  . valACYclovir (VALTREX) 500 MG tablet Take 1 tablet (500 mg total) by mouth daily. Bid x 5 days prn (Patient not taking: Reported on 05/31/2019)   No current facility-administered medications on file prior to visit.     No Known Allergies  Review Of Systems:  Constitutional:   No  weight loss, night sweats,  Fevers, chills, fatigue, or  lassitude.  HEENT:   No headaches,  Difficulty swallowing,  Tooth/dental problems, or  Sore throat,                No sneezing, itching, ear ache, nasal congestion, post nasal drip,   CV:  No chest pain,  Orthopnea, PND, swelling in lower extremities, anasarca, dizziness, palpitations, syncope.   GI  No heartburn, indigestion, abdominal pain, nausea, vomiting, diarrhea, change in bowel habits, loss of appetite, bloody stools.   Resp: No shortness of breath with exertion or at rest.  No excess mucus, no productive cough,  No non-productive cough,  No coughing up of blood.  No change in color of mucus.  No wheezing.  No chest wall deformity  Skin: no rash or lesions.  GU: no dysuria, change in color of urine, no urgency or frequency.  No flank pain, no hematuria   MS:  No joint pain or swelling.  No decreased range of motion.  No  back pain.  Psych:  No change in mood or affect. No depression or anxiety.  No memory loss.   Vital Signs There were no vitals taken for this visit.   Physical Exam:  General- No distress,  A&Ox3 ENT: No sinus tenderness, TM clear, pale nasal mucosa, no oral exudate,no post nasal drip, no LAN Cardiac: S1, S2, regular rate and rhythm, no murmur Chest: No wheeze/ rales/ dullness; no accessory muscle use, no nasal flaring, no sternal retractions Abd.: Soft Non-tender Ext: No clubbing cyanosis, edema Neuro:  normal strength Skin: No rashes, warm and dry Psych: normal mood and behavior   Assessment/Plan  No problem-specific  Assessment & Plan notes found for this encounter.    Magdalen Spatz, NP 07/22/2019  9:10 AM

## 2019-07-24 ENCOUNTER — Telehealth: Payer: Self-pay | Admitting: Internal Medicine

## 2019-07-24 NOTE — Telephone Encounter (Signed)
Appt for what?

## 2019-07-24 NOTE — Telephone Encounter (Signed)
Pulmonary referral 

## 2019-07-24 NOTE — Telephone Encounter (Signed)
Pt cancelled appt on 4/19 and did not wish to r/s at this time BL

## 2019-07-25 NOTE — Telephone Encounter (Signed)
Pulmonary referral 

## 2019-08-29 LAB — CBC WITH DIFFERENTIAL/PLATELET
Basophils Absolute: 0 10*3/uL (ref 0.0–0.2)
Basos: 1 %
EOS (ABSOLUTE): 0.1 10*3/uL (ref 0.0–0.4)
Eos: 2 %
Hematocrit: 31.8 % — ABNORMAL LOW (ref 34.0–46.6)
Hemoglobin: 10.3 g/dL — ABNORMAL LOW (ref 11.1–15.9)
Immature Grans (Abs): 0 10*3/uL (ref 0.0–0.1)
Immature Granulocytes: 0 %
Lymphocytes Absolute: 2.4 10*3/uL (ref 0.7–3.1)
Lymphs: 33 %
MCH: 24.8 pg — ABNORMAL LOW (ref 26.6–33.0)
MCHC: 32.4 g/dL (ref 31.5–35.7)
MCV: 76 fL — ABNORMAL LOW (ref 79–97)
Monocytes Absolute: 0.6 10*3/uL (ref 0.1–0.9)
Monocytes: 9 %
Neutrophils Absolute: 4 10*3/uL (ref 1.4–7.0)
Neutrophils: 55 %
Platelets: 445 10*3/uL (ref 150–450)
RBC: 4.16 x10E6/uL (ref 3.77–5.28)
RDW: 14.9 % (ref 11.7–15.4)
WBC: 7.2 10*3/uL (ref 3.4–10.8)

## 2019-08-29 LAB — COMPREHENSIVE METABOLIC PANEL
ALT: 10 IU/L (ref 0–32)
AST: 16 IU/L (ref 0–40)
Albumin/Globulin Ratio: 1.2 (ref 1.2–2.2)
Albumin: 4.1 g/dL (ref 3.8–4.8)
Alkaline Phosphatase: 96 IU/L (ref 48–121)
BUN/Creatinine Ratio: 16 (ref 9–23)
BUN: 10 mg/dL (ref 6–24)
Bilirubin Total: 0.3 mg/dL (ref 0.0–1.2)
CO2: 27 mmol/L (ref 20–29)
Calcium: 9.2 mg/dL (ref 8.7–10.2)
Chloride: 92 mmol/L — ABNORMAL LOW (ref 96–106)
Creatinine, Ser: 0.61 mg/dL (ref 0.57–1.00)
GFR calc Af Amer: 127 mL/min/{1.73_m2} (ref >59)
GFR calc non Af Amer: 110 mL/min/{1.73_m2} (ref >59)
Globulin, Total: 3.4 g/dL (ref 1.5–4.5)
Glucose: 89 mg/dL (ref 65–99)
Potassium: 3.4 mmol/L — ABNORMAL LOW (ref 3.5–5.2)
Sodium: 133 mmol/L — ABNORMAL LOW (ref 134–144)
Total Protein: 7.5 g/dL (ref 6.0–8.5)

## 2019-08-29 LAB — LIPID PANEL
Chol/HDL Ratio: 2.7 ratio (ref 0.0–4.4)
Cholesterol, Total: 137 mg/dL (ref 100–199)
HDL: 50 mg/dL (ref >39)
LDL Chol Calc (NIH): 71 mg/dL (ref 0–99)
Triglycerides: 81 mg/dL (ref 0–149)
VLDL Cholesterol Cal: 16 mg/dL (ref 5–40)

## 2019-08-29 LAB — VITAMIN D 25 HYDROXY (VIT D DEFICIENCY, FRACTURES): Vit D, 25-Hydroxy: 28.4 ng/mL — ABNORMAL LOW (ref 30.0–100.0)

## 2019-08-29 LAB — TSH: TSH: 2.29 u[IU]/mL (ref 0.450–4.500)

## 2019-08-29 LAB — HEMOGLOBIN A1C
Est. average glucose Bld gHb Est-mCnc: 146 mg/dL
Hgb A1c MFr Bld: 6.7 % — ABNORMAL HIGH (ref 4.8–5.6)

## 2019-09-04 ENCOUNTER — Other Ambulatory Visit: Payer: Self-pay | Admitting: Internal Medicine

## 2019-09-04 DIAGNOSIS — E1165 Type 2 diabetes mellitus with hyperglycemia: Secondary | ICD-10-CM

## 2019-09-04 MED ORDER — METFORMIN HCL 500 MG PO TABS: ORAL_TABLET | ORAL | 3 refills | Status: DC

## 2019-09-26 ENCOUNTER — Encounter: Payer: Self-pay | Admitting: Internal Medicine

## 2019-10-03 ENCOUNTER — Other Ambulatory Visit: Payer: Self-pay | Admitting: Obstetrics & Gynecology

## 2019-10-03 ENCOUNTER — Encounter: Payer: Self-pay | Admitting: Internal Medicine

## 2019-10-03 ENCOUNTER — Other Ambulatory Visit: Payer: Self-pay

## 2019-10-03 ENCOUNTER — Ambulatory Visit (INDEPENDENT_AMBULATORY_CARE_PROVIDER_SITE_OTHER): Payer: Managed Care, Other (non HMO) | Admitting: Internal Medicine

## 2019-10-03 VITALS — BP 154/96 | HR 81 | Temp 98.6°F | Ht 64.25 in | Wt 304.0 lb

## 2019-10-03 DIAGNOSIS — G4733 Obstructive sleep apnea (adult) (pediatric): Secondary | ICD-10-CM

## 2019-10-03 DIAGNOSIS — Z9989 Dependence on other enabling machines and devices: Secondary | ICD-10-CM

## 2019-10-03 DIAGNOSIS — G8929 Other chronic pain: Secondary | ICD-10-CM

## 2019-10-03 DIAGNOSIS — R519 Headache, unspecified: Secondary | ICD-10-CM

## 2019-10-03 DIAGNOSIS — Z1211 Encounter for screening for malignant neoplasm of colon: Secondary | ICD-10-CM

## 2019-10-03 DIAGNOSIS — I10 Essential (primary) hypertension: Secondary | ICD-10-CM

## 2019-10-03 DIAGNOSIS — N939 Abnormal uterine and vaginal bleeding, unspecified: Secondary | ICD-10-CM

## 2019-10-03 DIAGNOSIS — E1159 Type 2 diabetes mellitus with other circulatory complications: Secondary | ICD-10-CM

## 2019-10-03 DIAGNOSIS — D649 Anemia, unspecified: Secondary | ICD-10-CM

## 2019-10-03 DIAGNOSIS — N938 Other specified abnormal uterine and vaginal bleeding: Secondary | ICD-10-CM

## 2019-10-03 DIAGNOSIS — Z0000 Encounter for general adult medical examination without abnormal findings: Secondary | ICD-10-CM | POA: Diagnosis not present

## 2019-10-03 DIAGNOSIS — Z6841 Body Mass Index (BMI) 40.0 and over, adult: Secondary | ICD-10-CM

## 2019-10-03 DIAGNOSIS — Z1231 Encounter for screening mammogram for malignant neoplasm of breast: Secondary | ICD-10-CM

## 2019-10-03 MED ORDER — SPIRONOLACTONE 50 MG PO TABS: 50.0000 mg | ORAL_TABLET | Freq: Every day | ORAL | 3 refills | Status: DC

## 2019-10-03 NOTE — Progress Notes (Signed)
Chief Complaint  Patient presents with  . Annual Exam   Annual  1. HTN uncontrolled on norvasc 5, chlorthalidone 25, toprol xl 25 and lis 40 BP readings at home 140s had BP meds 2 hours ago  2. Morbid obesity wants weight loss surgery as weight has been a struggle for years. She is looking into [employer] and liver focus cleanse she is thinking about purchasing this  She is going to [employer] for lunch for fried chicken sandwich today and we disc'ed changes in diet to help and she is going to try smoothies   3. DM 2 A1C 6.7 on metformin 500 mg reviewed labs 08/28/19   4. DUB and anemia on cycle since 09/16/19 will [facility] OB/GYN for her to f/u   Review of Systems  Constitutional: Negative for weight loss.  HENT: Negative for hearing loss.   Eyes: Negative for blurred vision.  Respiratory: Negative for shortness of breath.   Cardiovascular: Negative for chest pain.  Gastrointestinal: Negative for abdominal pain and blood in stool.  Musculoskeletal: Negative for falls.  Skin: Negative for rash.  Neurological: Negative for headaches.  Psychiatric/Behavioral: Negative for depression.   Past Medical History:  Diagnosis Date  . Alcohol abuse   . Arthritis   . Cervical radiculopathy   . Diabetes mellitus without complication ([place]) 1610  . Herpes    1 and 2 since 44 y.o   . Hyperlipidemia   . Hypertension   . Morbid obesity ([place])   . OSA (obstructive sleep apnea)    Not on CPAP  . Psoriasis   . Tobacco abuse    No past surgical history on file. Family History  Problem Relation Age of Onset  . Alcohol abuse Maternal Grandmother   . Cancer Maternal Grandmother        Ovarian  . Hypertension Maternal Grandmother   . Diabetes Maternal Grandmother   . Hypertension Mother   . Diabetes Mother   . Colon cancer Other 71  . Breast cancer Other        + FH  . Aneurysm Maternal Grandfather   . Alcohol abuse Maternal Grandfather    Social History   Socioeconomic History  .  Marital status: Single    Spouse name: Not on file  . Number of children: 0  . Years of education: Not on file  . Highest education level: Not on file  Occupational History  . Not on file  Tobacco Use  . Smoking status: Former Smoker    Packs/day: 0.50    Types: Cigarettes  . Smokeless tobacco: Never Used  . Tobacco comment: stopped 11/2015  Vaping Use  . Vaping Use: Never used  Substance and Sexual Activity  . Alcohol use: Yes    Alcohol/week: 0.0 standard drinks    Comment: occasionally  . Drug use: No  . Sexual activity: Yes    Partners: Male    Birth control/protection: None    Comment: 1 partner  Other Topics Concern  . 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    Associates Degree   No children    No caffeine   Social Determinants of Health   Financial Resource Strain:   . Difficulty of Paying Living Expenses:   Food Insecurity:   . Worried About Charity fundraiser in the Last Year:   . [place] in the Last  Year:   Transportation Needs:   . Film/video editor (Medical):   Marland Kitchen Lack of Transportation (Non-Medical):   Physical Activity:   . Days of Exercise per Week:   . Minutes of Exercise per Session:   Stress:   . Feeling of Stress :   Social Connections:   . Frequency of Communication with Friends and Family:   . Frequency of Social Gatherings with Friends and Family:   . Attends Religious Services:   . Active Member of Clubs or Organizations:   . Attends Archivist Meetings:   Marland Kitchen Marital Status:   Intimate Partner Violence:   . Fear of Current or Ex-Partner:   . Emotionally Abused:   Marland Kitchen Physically Abused:   . Sexually Abused:    Current Meds  Medication Sig  . amLODipine (NORVASC) 5 MG tablet Take 1 tablet (5 mg total) by mouth daily.  Marland Kitchen aspirin 81 MG tablet Take 81 mg by mouth daily.  Marland Kitchen aspirin-acetaminophen-caffeine (EXCEDRIN MIGRAINE) 250-250-65 MG tablet Take  1 tablet by mouth every 6 (six) hours as needed for headache.  . Cholecalciferol 50000 units capsule Take 1 capsule (50,000 Units total) by mouth once a week.  . COSENTYX SENSOREADY, 300 MG, 150 MG/ML SOAJ   . lisinopril (ZESTRIL) 40 MG tablet Take 1 tablet (40 mg total) by mouth daily.  . metFORMIN (GLUCOPHAGE) 500 MG tablet Bid with food  . metoprolol succinate (TOPROL-XL) 25 MG 24 hr tablet Take 1 tablet (25 mg total) by mouth daily. At night  . pravastatin (PRAVACHOL) 40 MG tablet Take 1 tablet (40 mg total) by mouth daily.  . valACYclovir (VALTREX) 500 MG tablet Take 1 tablet (500 mg total) by mouth daily. Bid x 5 days prn  . [DISCONTINUED] chlorthalidone (HYGROTON) 25 MG tablet Take 1 tablet (25 mg total) by mouth daily.   No Known Allergies Recent Results (from the past 2160 hour(s))  Comprehensive metabolic panel     Status: Abnormal   Collection Time: 08/28/19 12:13 PM  Result Value Ref Range   Glucose 89 65 - 99 mg/dL   BUN 10 6 - 24 mg/dL   Creatinine, Ser 0.61 0.57 - 1.00 mg/dL   GFR calc non Af Amer 110 >59 mL/min/1.73   GFR calc Af Amer 127 >59 mL/min/1.73    Comment: **[facility] currently reports eGFR in compliance with the current**   recommendations of the [employer]. [facility] will   update reporting as new guidelines are published from the NKF-ASN   Task force.    BUN/Creatinine Ratio 16 9 - 23   Sodium 133 (L) 134 - 144 mmol/L   Potassium 3.4 (L) 3.5 - 5.2 mmol/L   Chloride 92 (L) 96 - 106 mmol/L   CO2 27 20 - 29 mmol/L   Calcium 9.2 8.7 - 10.2 mg/dL   Total Protein 7.5 6.0 - 8.5 g/dL   Albumin 4.1 3.8 - 4.8 g/dL   Globulin, Total 3.4 1.5 - 4.5 g/dL   Albumin/Globulin Ratio 1.2 1.2 - 2.2   Bilirubin Total 0.3 0.0 - 1.2 mg/dL   Alkaline Phosphatase 96 48 - 121 IU/L    Comment:               **Please note reference interval change**   AST 16 0 - 40 IU/L   ALT 10 0 - 32 IU/L  CBC with Differential/Platelet     Status: Abnormal   Collection Time:  08/28/19 12:13 PM  Result Value Ref Range  WBC 7.2 3.4 - 10.8 x10E3/uL   RBC 4.16 3.77 - 5.28 x10E6/uL   Hemoglobin 10.3 (L) 11.1 - 15.9 g/dL   Hematocrit 31.8 (L) 34.0 - 46.6 %   MCV 76 (L) 79 - 97 fL   MCH 24.8 (L) 26.6 - 33.0 pg   MCHC 32.4 31 - 35 g/dL   RDW 14.9 11.7 - 15.4 %   Platelets 445 150 - 450 x10E3/uL   Neutrophils 55 Not Estab. %   Lymphs 33 Not Estab. %   Monocytes 9 Not Estab. %   Eos 2 Not Estab. %   Basos 1 Not Estab. %   Neutrophils Absolute 4.0 1 - 7 x10E3/uL   Lymphocytes Absolute 2.4 0 - 3 x10E3/uL   Monocytes Absolute 0.6 0 - 0 x10E3/uL   EOS (ABSOLUTE) 0.1 0.0 - 0.4 x10E3/uL   Basophils Absolute 0.0 0 - 0 x10E3/uL   Immature Granulocytes 0 Not Estab. %   Immature Grans (Abs) 0.0 0.0 - 0.1 x10E3/uL  Lipid panel     Status: None   Collection Time: 08/28/19 12:13 PM  Result Value Ref Range   Cholesterol, Total 137 100 - 199 mg/dL   Triglycerides 81 0 - 149 mg/dL   HDL 50 >39 mg/dL   VLDL Cholesterol Cal 16 5 - 40 mg/dL   LDL Chol Calc (NIH) 71 0 - 99 mg/dL   Chol/HDL Ratio 2.7 0.0 - 4.4 ratio    Comment:                                   T. Chol/HDL Ratio                                             Men  Women                               1/2 Avg.Risk  3.4    3.3                                   Avg.Risk  5.0    4.4                                2X Avg.Risk  9.6    7.1                                3X Avg.Risk 23.4   11.0   TSH     Status: None   Collection Time: 08/28/19 12:13 PM  Result Value Ref Range   TSH 2.290 0.450 - 4.500 uIU/mL  HgB A1c     Status: Abnormal   Collection Time: 08/28/19 12:13 PM  Result Value Ref Range   Hgb A1c MFr Bld 6.7 (H) 4.8 - 5.6 %    Comment:          Prediabetes: 5.7 - 6.4          Diabetes: >6.4          Glycemic control for adults with diabetes: <7.0    Est. average  glucose Bld gHb Est-mCnc 146 mg/dL  Vitamin D (25 hydroxy)     Status: Abnormal   Collection Time: 08/28/19 12:13 PM  Result Value Ref  Range   Vit D, 25-Hydroxy 28.4 (L) 30.0 - 100.0 ng/mL    Comment: Vitamin D deficiency has been defined by the [place] practice guideline as a level of serum 25-OH vitamin D less than 20 ng/mL (1,2). The Endocrine Society went on to further define vitamin D insufficiency as a level between 21 and 29 ng/mL (2). 1. IOM (Institute of Medicine). 2010. Dietary reference    intakes for calcium and D. [place]: The    [employer]. 2. Holick MF, Binkley Kerr, Bischoff-Ferrari HA, et al.    Evaluation, treatment, and prevention of vitamin D    deficiency: an Endocrine Society clinical practice    guideline. JCEM. 2011 Jul; 96(7):1911-30.    Objective  Body mass index is 51.77 kg/m. Wt Readings from Last 3 Encounters:  10/03/19 (!) 304 lb (137.9 kg)  05/31/19 (!) 318 lb (144.2 kg)  03/15/19 (!) 318 lb (144.2 kg)   Temp Readings from Last 3 Encounters:  10/03/19 98.6 F (37 C) (Oral)  03/16/18 98.9 F (37.2 C) (Oral)  01/30/18 98.6 F (37 C) (Oral)   BP Readings from Last 3 Encounters:  10/03/19 (!) 154/96  05/31/19 140/80  03/15/19 (!) 160/108   Pulse Readings from Last 3 Encounters:  10/03/19 81  02/20/19 88  03/16/18 (!) 111    Physical Exam Vitals and nursing note reviewed.  Constitutional:      Appearance: Normal appearance. She is well-developed and well-groomed. She is morbidly obese.  HENT:     Head: Normocephalic and atraumatic.  Eyes:     Conjunctiva/sclera: Conjunctivae normal.     Pupils: Pupils are equal, round, and reactive to light.  Cardiovascular:     Rate and Rhythm: Normal rate and regular rhythm.     Heart sounds: Normal heart sounds. No murmur heard.   Pulmonary:     Effort: Pulmonary effort is normal.     Breath sounds: Normal breath sounds.  Skin:    General: Skin is warm and dry.  Neurological:     General: No focal deficit present.     Mental Status: She is alert and oriented to person,  place, and time. Mental status is at baseline.     Gait: Gait normal.  Psychiatric:        Attention and Perception: Attention and perception normal.        Mood and Affect: Mood and affect normal.        Speech: Speech normal.        Behavior: Behavior normal. Behavior is cooperative.        Thought Content: Thought content normal.        Cognition and Memory: Cognition and memory normal.        Judgment: Judgment normal.     Assessment  Plan  Annual physical exam Fluutd Tdap had due again  pna 23 haddue again  covid 2/2  mammo11/30/20 with bx negative12/4/20 Pap 08/24/17 neg pap neg HPV Colonoscopy consider age 44referred today [facility] rec healthy diet and exercise rec MMR vx health dept Consider hep B testing in future with labs add on when pt get labs Rec healthy diet and exercise    Essential hypertension - Plan: spironolactone (ALDACTONE) 50 MG tablet, norvasc 5, toprol xl 25, lis 40  Amb Referral  to Bariatric Surgery Dr. Kreg Shropshire  rec healthy diet and exercise  D/c chlorthalidone 25  F/u in 4 months  Log BP at home   Hypertension associated with diabetes ([place]) - Plan: Amb Referral to Bariatric Surgery Foot exam in the future  Morbid obesity with BMI of 50.0-59.9, adult ([place]) - Plan: Amb Referral to Bariatric Surgery  DUB (dysfunctional uterine bleeding) Anemia, unspecified type  Cc Dr. Kenton Kingfisher ob/gyn to sch appt   Provider: Dr. Olivia Mackie McLean-Scocuzza-[facility] Medicine

## 2019-10-03 NOTE — Progress Notes (Signed)
Patient here for annual exam. She is not due for a PAP and sees OBGYN for breast exam.   Patient flagged: Current status:  PATIENT IS OVERDUE FOR BMI FOLLOW UP PLAN BMI is estimated to be 51.8 based on the last recorded weight and height

## 2019-10-03 NOTE — Patient Instructions (Addendum)
Premier protein shake with banana   Hypokalemia Hypokalemia means that the amount of potassium in the blood is lower than normal. Potassium is a chemical (electrolyte) that helps regulate the amount of fluid in the body. It also stimulates muscle tightening (contraction) and helps nerves work properly. Normally, most of the body's potassium is inside cells, and only a very small amount is in the blood. Because the amount in the blood is so small, minor changes to potassium levels in the blood can be life-threatening. What are the causes? This condition may be caused by:  Antibiotic medicine.  Diarrhea or vomiting. Taking too much of a medicine that helps you have a bowel movement (laxative) can cause diarrhea and lead to hypokalemia.  Chronic kidney disease (CKD).  Medicines that help the body get rid of excess fluid (diuretics).  Eating disorders, such as bulimia.  Low magnesium levels in the body.  Sweating a lot. What are the signs or symptoms? Symptoms of this condition include:  Weakness.  Constipation.  Fatigue.  Muscle cramps.  Mental confusion.  Skipped heartbeats or irregular heartbeat (palpitations).  Tingling or numbness. How is this diagnosed? This condition is diagnosed with a blood test. How is this treated? This condition may be treated by:  Taking potassium supplements by mouth.  Adjusting the medicines that you take.  Eating more foods that contain a lot of potassium. If your potassium level is very low, you may need to get potassium through an IV and be monitored in the hospital. Follow these instructions at home:   Take over-the-counter and prescription medicines only as told by your health care provider. This includes vitamins and supplements.  Eat a healthy diet. A healthy diet includes fresh fruits and vegetables, whole grains, healthy fats, and lean proteins.  If instructed, eat more foods that contain a lot of potassium. This  includes: ? Nuts, such as peanuts and pistachios. ? Seeds, such as sunflower seeds and pumpkin seeds. ? Peas, lentils, and lima beans. ? Whole grain and bran cereals and breads. ? Fresh fruits and vegetables, such as apricots, avocado, bananas, cantaloupe, kiwi, oranges, tomatoes, asparagus, and potatoes. ? Orange juice. ? Tomato juice. ? Red meats. ? Yogurt.  Keep all follow-up visits as told by your health care provider. This is important. Contact a health care provider if you:  Have weakness that gets worse.  Feel your heart pounding or racing.  Vomit.  Have diarrhea.  Have diabetes (diabetes mellitus) and you have trouble keeping your blood sugar (glucose) in your target range. Get help right away if you:  Have chest pain.  Have shortness of breath.  Have vomiting or diarrhea that lasts for more than 2 days.  Faint. Summary  Hypokalemia means that the amount of potassium in the blood is lower than normal.  This condition is diagnosed with a blood test.  Hypokalemia may be treated by taking potassium supplements, adjusting the medicines that you take, or eating more foods that are high in potassium.  If your potassium level is very low, you may need to get potassium through an IV and be monitored in the hospital. This information is not intended to replace advice given to you by your health care provider. Make sure you discuss any questions you have with your health care provider. Document Revised: 11/01/2017 Document Reviewed: 11/01/2017 Elsevier Patient Education  2020 [employer].    Budget-Friendly Healthy Eating There are many ways to save money at the grocery store and continue to eat  healthy. You can be successful if you:  Plan meals according to your budget.  Make a grocery list and only purchase food according to your grocery list.  Prepare food yourself. What are tips for following this plan?  Reading food labels  Compare food labels between  brand name foods and the store brand. Often the nutritional value is the same, but the store brand is lower cost.  Look for products that do not have added sugar, fat, or salt (sodium). These often cost the same but are healthier for you. Products may be labeled as: ? Sugar-free. ? Nonfat. ? Low-fat. ? Sodium-free. ? Low-sodium.  Look for lean ground beef labeled as at least 92% lean and 8% fat. Shopping  Buy only the items on your grocery list and go only to the areas of the store that have the items on your list.  Use coupons only for foods and brands you normally buy. Avoid buying items you wouldn't normally buy simply because they are on sale.  Check online and in newspapers for weekly deals.  Buy healthy items from the bulk bins when available, such as herbs, spices, flour, pasta, nuts, and dried fruit.  Buy fruits and vegetables that are in season. Prices are usually lower on in-season produce.  Look at the unit price on the price tag. Use it to compare different brands and sizes to find out which item is the best deal.  Choose healthy items that are often low-cost, such as carrots, potatoes, apples, bananas, and oranges. Dried or canned beans are a low-cost protein source.  Buy in bulk and freeze extra food. Items you can buy in bulk include meats, fish, poultry, frozen fruits, and frozen vegetables.  Avoid buying "ready-to-eat" foods, such as pre-cut fruits and vegetables and pre-made salads.  If possible, shop around to discover where you can find the best prices. Consider other retailers such as dollar stores, larger [employer], local fruit and vegetable stands, and farmers markets.  Do not shop when you are hungry. If you shop while hungry, it may be hard to stick to your list and budget.  Resist impulse buying. Use your grocery list as your official plan for the week.  Buy a variety of vegetables and fruits by purchasing fresh, frozen, and canned items.  Look  at the top and bottom shelves for deals. Foods at eye level (eye level of an adult or child) are usually more expensive.  Be efficient with your time when shopping. The more time you spend at the store, the more money you are likely to spend.  To save money when choosing more expensive foods like meats and dairy: ? Choose cheaper cuts of meat, such as bone-in chicken thighs and drumsticks instead of skinless and boneless chicken. When you are ready to prepare the chicken, you can remove the skin yourself to make it healthier. ? Choose lean meats like chicken or [place] instead of beef. ? Choose canned seafood, such as tuna, salmon, or sardines. ? Buy eggs as a low-cost source of protein. ? Buy dried beans and peas, such as lentils, split peas, or kidney beans instead of meats. Dried beans and peas are a good alternative source of protein. ? Buy the larger tubs of yogurt instead of individual-sized containers.  Choose water instead of sodas and other sweetened beverages.  Avoid buying chips, cookies, and other "junk food." These items are usually expensive and not healthy. Cooking  Make extra food and freeze the extras in  meal-sized containers or in individual portions for fast meals and snacks.  Pre-cook on days when you have extra time to prepare meals in advance. You can keep these meals in the fridge or freezer and reheat for a quick meal.  When you come home from the grocery store, wash, peel, and cut fruits and vegetables so they are ready to use and eat. This will help reduce food waste. Meal planning  Do not eat out or get fast food. Prepare food at home.  Make a grocery list and make sure to bring it with you to the store. If you have a smart phone, you could use your phone to create your shopping list.  Plan meals and snacks according to a grocery list and budget you create.  Use leftovers in your meal plan for the week.  Look for recipes where you can cook once and make  enough food for two meals.  Include budget-friendly meals like stews, casseroles, and stir-fry dishes.  Try some meatless meals or try "no cook" meals like salads.  Make sure that half your plate is filled with fruits or vegetables. Choose from fresh, frozen, or canned fruits and vegetables. If eating canned, remember to rinse them before eating. This will remove any excess salt added for packaging. Summary  Eating healthy on a budget is possible if you plan your meals according to your budget, purchase according to your budget and grocery list, and prepare food yourself.  Tips for buying more food on a limited budget include buying generic brands, using coupons only for foods you normally buy, and buying healthy items from the bulk bins when available.  Tips for buying cheaper food to replace expensive food include choosing cheaper, lean cuts of meat, and buying dried beans and peas. This information is not intended to replace advice given to you by your health care provider. Make sure you discuss any questions you have with your health care provider. Document Revised: 03/22/2017 Document Reviewed: 03/22/2017 Elsevier Patient Education  2020 [employer].

## 2019-10-09 ENCOUNTER — Ambulatory Visit: Payer: Managed Care, Other (non HMO) | Admitting: Dermatology

## 2019-10-10 ENCOUNTER — Telehealth: Payer: Self-pay | Admitting: Obstetrics & Gynecology

## 2019-10-10 NOTE — Telephone Encounter (Signed)
Attempt to reach patient to schedule ultrasound and new patient follow up for AUB with Dr. Tiburcio Pea. Voicemail box is full unable to leave message for patient to call back to be scheduled.

## 2019-10-16 LAB — HM DIABETES EYE EXAM

## 2019-11-27 ENCOUNTER — Encounter: Payer: Self-pay | Admitting: Internal Medicine

## 2019-11-29 NOTE — Telephone Encounter (Signed)
She could be scheduled with another provider to discuss her headaches, though typically FMLA needs to be completed by the PCP and there is the potential that another provider may not complete the FMLA paperwork for her or may only do it for a short period of time until Dr McLean-Scocuzza can complete it for more long term.

## 2019-11-29 NOTE — Telephone Encounter (Signed)
Please advise , Dr French Ana McLean-Scocuzza does not have any openings until 12/17/19

## 2019-12-10 NOTE — Telephone Encounter (Signed)
Called to inform patient that her physical form has been placed upfront for pick -up.  Patient then inquiring about completion of FMLA paperwork. Patient did not read previous sent message on mychart.   Patient states she is needing the paperwork filled out by this Friday. Informed the patient that outside of an appointment FMLA paperwork takes 14-17 days for completion. Dr French Ana McLean-Scocuzza has no openings this week for an appointment.   Patient would still like to inquire if Dr French Ana McLean-Scocuzza may be able to complete paperwork by Friday anyway.   Please advise

## 2019-12-12 ENCOUNTER — Telehealth: Payer: Self-pay | Admitting: Internal Medicine

## 2019-12-12 NOTE — Telephone Encounter (Signed)
Pt had a consultation appt at [facility] on 11/26/2019 no show appt.

## 2019-12-13 NOTE — Telephone Encounter (Signed)
No answer, no voicemail.  See mychart message encounter

## 2019-12-13 NOTE — Telephone Encounter (Signed)
I do not have FMLA paperwork please have her fax today

## 2019-12-17 ENCOUNTER — Telehealth: Payer: Self-pay | Admitting: Internal Medicine

## 2019-12-17 ENCOUNTER — Encounter: Payer: Self-pay | Admitting: Internal Medicine

## 2019-12-17 NOTE — Addendum Note (Signed)
Addended by: Quentin Ore on: 12/17/2019 02:08 PM   Modules accepted: Orders

## 2019-12-17 NOTE — Telephone Encounter (Signed)
No answer, no voicemail.  See previous mychart encounter FMLA paperwork has been completed

## 2019-12-18 ENCOUNTER — Ambulatory Visit: Payer: Self-pay

## 2019-12-18 ENCOUNTER — Ambulatory Visit
Admission: EM | Admit: 2019-12-18 | Discharge: 2019-12-18 | Disposition: A | Discharge status: Home / Self Care | Payer: Managed Care, Other (non HMO) | Attending: Emergency Medicine | Admitting: Emergency Medicine

## 2019-12-18 ENCOUNTER — Other Ambulatory Visit: Payer: Self-pay

## 2019-12-18 DIAGNOSIS — Z202 Contact with and (suspected) exposure to infections with a predominantly sexual mode of transmission: Secondary | ICD-10-CM | POA: Insufficient documentation

## 2019-12-18 NOTE — ED Triage Notes (Signed)
Pt presents for STD screen partner may have HIV , wants screening for everything

## 2019-12-18 NOTE — Discharge Instructions (Addendum)
Vaginal self swab  obtained  Take medications as prescribed and to completion We will follow up with you regarding the results of your test If tests are positive, please abstain from sexual activity until you and your partner(s) are treated Follow up with PCP or [facility] if symptoms persists Return here or go to ER if you have any new or worsening

## 2019-12-18 NOTE — ED Provider Notes (Signed)
[facility]   [id number] 12/18/19 Arrival Time: 1930   [id number] FOR STD  SUBJECTIVE:  Nicole Mendez is a 44 y.o. female who presents requesting STI screening.  Currently asymptomatic.  Partner asymptomatic.  Last unprotected sexual encounter 2 days ago.  Sexually active with 1 female partner.  Denies similar symptoms in the past.  Denies fever, chills, nausea, vomiting, abdominal or pelvic pain, urinary symptoms, vaginal itching, vaginal odor, vaginal bleeding, dyspareunia, vaginal rashes or lesions.    Patient's last menstrual period was 12/03/2019.  ROS: As per HPI.  All other pertinent ROS negative.     Past Medical History:  Diagnosis Date  . Alcohol abuse   . Arthritis   . Cervical radiculopathy   . Diabetes mellitus without complication (HCC) 2000  . Herpes    1 and 2 since 44 y.o   . Hyperlipidemia   . Hypertension   . Morbid obesity (HCC)   . OSA (obstructive sleep apnea)    Not on CPAP  . Psoriasis   . Tobacco abuse    No past surgical history on file. No Known Allergies No current facility-administered medications on file prior to encounter.   Current Outpatient Medications on File Prior to Encounter  Medication Sig Dispense Refill  . acetaminophen (TYLENOL) 650 MG CR tablet Take 650 mg by mouth every 8 (eight) hours as needed for pain. (Patient not taking: Reported on 10/03/2019)    . albuterol (VENTOLIN HFA) 108 (90 Base) MCG/ACT inhaler Inhale 1-2 puffs into the lungs every 4 (four) hours as needed for wheezing or shortness of breath. (Patient not taking: Reported on 10/03/2019) 18 g 2  . amLODipine (NORVASC) 5 MG tablet Take 1 tablet (5 mg total) by mouth daily. 90 tablet 3  . aspirin 81 MG tablet Take 81 mg by mouth daily.    Marland Kitchen aspirin-acetaminophen-caffeine (EXCEDRIN MIGRAINE) 250-250-65 MG tablet Take 1 tablet by mouth every 6 (six) hours as needed for headache.    . Cholecalciferol 50000 units capsule Take 1 capsule (50,000 Units total) by  mouth once a week. 13 capsule 1  . clotrimazole-betamethasone (LOTRISONE) cream Apply 1 application topically 2 (two) times daily. (Patient not taking: Reported on 10/03/2019) 30 g 0  . COSENTYX SENSOREADY, 300 MG, 150 MG/ML SOAJ     . cyclobenzaprine (FLEXERIL) 5 MG tablet Take 1 tablet (5 mg total) by mouth daily as needed for muscle spasms. (Patient not taking: Reported on 10/03/2019) 90 tablet 0  . lisinopril (ZESTRIL) 40 MG tablet Take 1 tablet (40 mg total) by mouth daily. 90 tablet 3  . metFORMIN (GLUCOPHAGE) 500 MG tablet Bid with food 180 tablet 3  . metoprolol succinate (TOPROL-XL) 25 MG 24 hr tablet Take 1 tablet (25 mg total) by mouth daily. At night 90 tablet 3  . mupirocin ointment (BACTROBAN) 2 % Apply 1 application topically 2 (two) times daily. Breast (Patient not taking: Reported on 10/03/2019) 30 g 2  . pravastatin (PRAVACHOL) 40 MG tablet Take 1 tablet (40 mg total) by mouth daily. 90 tablet 3  . spironolactone (ALDACTONE) 50 MG tablet Take 1 tablet (50 mg total) by mouth daily. In am 90 tablet 3  . valACYclovir (VALTREX) 500 MG tablet Take 1 tablet (500 mg total) by mouth daily. Bid x 5 days prn 90 tablet 3   Social History   Socioeconomic History  . Marital status: Single    Spouse name: Not on file  . Number of children: 0  . Years of  education: Not on file  . Highest education level: Not on file  Occupational History  . Not on file  Tobacco Use  . Smoking status: Former Smoker    Packs/day: 0.50    Types: Cigarettes  . Smokeless tobacco: Never Used  . Tobacco comment: stopped 11/2015  Vaping Use  . Vaping Use: Never used  Substance and Sexual Activity  . Alcohol use: Yes    Alcohol/week: 0.0 standard drinks    Comment: occasionally  . Drug use: No  . Sexual activity: Yes    Partners: Male    Birth control/protection: None    Comment: 1 partner  Other Topics Concern  . Not on file  Social History Narrative   Single   Employed as a Engineer, mining at [employer],  [employer] on 3rd shift.    Associates Degree   No children    No caffeine   Social Determinants of Health   Financial Resource Strain:   . Difficulty of Paying Living Expenses: Not on file  Food Insecurity:   . Worried About Programme researcher, broadcasting/film/video in the Last Year: Not on file  . Ran Out of Food in the Last Year: Not on file  Transportation Needs:   . Lack of Transportation (Medical): Not on file  . Lack of Transportation (Non-Medical): Not on file  Physical Activity:   . Days of Exercise per Week: Not on file  . Minutes of Exercise per Session: Not on file  Stress:   . Feeling of Stress : Not on file  Social Connections:   . Frequency of Communication with Friends and Family: Not on file  . Frequency of Social Gatherings with Friends and Family: Not on file  . Attends Religious Services: Not on file  . Active Member of Clubs or Organizations: Not on file  . Attends Banker Meetings: Not on file  . Marital Status: Not on file  Intimate Partner Violence:   . Fear of Current or Ex-Partner: Not on file  . Emotionally Abused: Not on file  . Physically Abused: Not on file  . Sexually Abused: Not on file   Family History  Problem Relation Age of Onset  . Alcohol abuse Maternal Grandmother   . Cancer Maternal Grandmother        Ovarian  . Hypertension Maternal Grandmother   . Diabetes Maternal Grandmother   . Hypertension Mother   . Diabetes Mother   . Colon cancer Other 80  . Breast cancer Other        + FH  . Aneurysm Maternal Grandfather   . Alcohol abuse Maternal Grandfather     OBJECTIVE:  There were no vitals filed for this visit.   General appearance: alert, NAD, appears stated age Head: NCAT Throat: lips, mucosa, and tongue normal; teeth and gums normal Lungs: CTA bilaterally without adventitious breath sounds Heart: regular rate and rhythm.  Radial pulses 2+ symmetrical bilaterally Back: no CVA tenderness Abdomen: soft, non-tender; bowel sounds normal;  no masses or organomegaly; no guarding or rebound tenderness GU: deferred Skin: warm and dry Psychological:  Alert and cooperative. Normal mood and affect.  LABS:  Results for orders placed or performed in visit on 04/30/19  Novel Coronavirus, NAA ([facility])   Specimen: Nasopharyngeal(NP) swabs in vial transport medium   NASOPHARYNGE  TESTING  Result Value Ref Range   SARS-CoV-2, NAA Not Detected Not Detected    Labs Reviewed  HIV ANTIBODY (ROUTINE TESTING W REFLEX)  [facility]  CERVICOVAGINAL ANCILLARY  ONLY    ASSESSMENT & PLAN:  1. Exposure to STD     No orders of the defined types were placed in this encounter.   Pending: Labs Reviewed  HIV ANTIBODY (ROUTINE TESTING W REFLEX)  [facility]  CERVICOVAGINAL ANCILLARY ONLY     Vaginal self swab  obtained  Take medications as prescribed and to completion We will follow up with you regarding the results of your test If tests are positive, please abstain from sexual activity until you and your partner(s) are treated Follow up with PCP or [facility] if symptoms persists Return here or go to ER if you have any new or worsening symptoms    Reviewed expectations re: course of current medical issues. Questions answered. Outlined signs and symptoms indicating need for more acute intervention. Patient verbalized understanding. After Visit Summary given.       Durward Parcel, FNP 12/18/19 2032

## 2019-12-20 ENCOUNTER — Encounter: Payer: Self-pay | Admitting: Internal Medicine

## 2019-12-20 LAB — RPR: RPR Ser Ql: NONREACTIVE

## 2019-12-20 LAB — CERVICOVAGINAL ANCILLARY ONLY
Bacterial Vaginitis (gardnerella): POSITIVE — AB
Candida Glabrata: NEGATIVE
Candida Vaginitis: NEGATIVE
Chlamydia: NEGATIVE
Comment: NEGATIVE
Comment: NEGATIVE
Comment: NEGATIVE
Comment: NEGATIVE
Comment: NEGATIVE
Comment: NORMAL
Neisseria Gonorrhea: NEGATIVE
Trichomonas: NEGATIVE

## 2019-12-20 LAB — HIV ANTIBODY (ROUTINE TESTING W REFLEX): HIV Screen 4th Generation wRfx: NONREACTIVE

## 2019-12-23 ENCOUNTER — Encounter: Payer: Self-pay | Admitting: Internal Medicine

## 2019-12-23 ENCOUNTER — Telehealth: Payer: Self-pay | Admitting: Internal Medicine

## 2019-12-23 NOTE — Telephone Encounter (Signed)
Pt is contacting me via my chart +BV 12/18/19  but you saw and diagnosed this in her will you treat her please she requests treatment?   Thanks [employer]

## 2019-12-26 ENCOUNTER — Other Ambulatory Visit: Payer: Self-pay | Admitting: Internal Medicine

## 2019-12-26 DIAGNOSIS — B9689 Other specified bacterial agents as the cause of diseases classified elsewhere: Secondary | ICD-10-CM

## 2019-12-26 MED ORDER — METRONIDAZOLE 500 MG PO TABS: 500.0000 mg | ORAL_TABLET | Freq: Two times a day (BID) | ORAL | 5 refills | Status: DC

## 2019-12-27 ENCOUNTER — Telehealth: Payer: Self-pay | Admitting: Emergency Medicine

## 2019-12-27 NOTE — Telephone Encounter (Signed)
Patient was called by provider.  She states metronidazole was prescribed by PCP and she will go get the medication today.

## 2020-01-14 ENCOUNTER — Other Ambulatory Visit: Payer: Self-pay

## 2020-01-14 DIAGNOSIS — I10 Essential (primary) hypertension: Secondary | ICD-10-CM

## 2020-01-14 MED ORDER — AMLODIPINE BESYLATE 5 MG PO TABS: 5.0000 mg | ORAL_TABLET | Freq: Every day | ORAL | 3 refills | Status: DC

## 2020-02-05 ENCOUNTER — Ambulatory Visit: Payer: Managed Care, Other (non HMO) | Admitting: Internal Medicine

## 2020-03-03 ENCOUNTER — Ambulatory Visit: Payer: Self-pay | Admitting: Neurology

## 2020-03-10 ENCOUNTER — Encounter: Payer: Self-pay | Admitting: Internal Medicine

## 2020-03-10 ENCOUNTER — Ambulatory Visit: Payer: Managed Care, Other (non HMO) | Admitting: Internal Medicine

## 2020-03-10 ENCOUNTER — Telehealth: Payer: Self-pay

## 2020-03-10 ENCOUNTER — Other Ambulatory Visit: Payer: Self-pay

## 2020-03-10 VITALS — BP 150/100 | HR 85 | Temp 98.1°F | Ht 64.25 in | Wt 286.2 lb

## 2020-03-10 DIAGNOSIS — E538 Deficiency of other specified B group vitamins: Secondary | ICD-10-CM

## 2020-03-10 DIAGNOSIS — Z13818 Encounter for screening for other digestive system disorders: Secondary | ICD-10-CM

## 2020-03-10 DIAGNOSIS — I10 Essential (primary) hypertension: Secondary | ICD-10-CM | POA: Diagnosis not present

## 2020-03-10 DIAGNOSIS — M1712 Unilateral primary osteoarthritis, left knee: Secondary | ICD-10-CM

## 2020-03-10 DIAGNOSIS — I152 Hypertension secondary to endocrine disorders: Secondary | ICD-10-CM

## 2020-03-10 DIAGNOSIS — E785 Hyperlipidemia, unspecified: Secondary | ICD-10-CM

## 2020-03-10 DIAGNOSIS — Z1211 Encounter for screening for malignant neoplasm of colon: Secondary | ICD-10-CM

## 2020-03-10 DIAGNOSIS — B009 Herpesviral infection, unspecified: Secondary | ICD-10-CM

## 2020-03-10 DIAGNOSIS — Z6841 Body Mass Index (BMI) 40.0 and over, adult: Secondary | ICD-10-CM

## 2020-03-10 DIAGNOSIS — Z1231 Encounter for screening mammogram for malignant neoplasm of breast: Secondary | ICD-10-CM

## 2020-03-10 DIAGNOSIS — Z1159 Encounter for screening for other viral diseases: Secondary | ICD-10-CM

## 2020-03-10 DIAGNOSIS — B9689 Other specified bacterial agents as the cause of diseases classified elsewhere: Secondary | ICD-10-CM

## 2020-03-10 DIAGNOSIS — N760 Acute vaginitis: Secondary | ICD-10-CM | POA: Diagnosis not present

## 2020-03-10 DIAGNOSIS — E1159 Type 2 diabetes mellitus with other circulatory complications: Secondary | ICD-10-CM

## 2020-03-10 DIAGNOSIS — D509 Iron deficiency anemia, unspecified: Secondary | ICD-10-CM

## 2020-03-10 DIAGNOSIS — E559 Vitamin D deficiency, unspecified: Secondary | ICD-10-CM

## 2020-03-10 DIAGNOSIS — Z0184 Encounter for antibody response examination: Secondary | ICD-10-CM

## 2020-03-10 MED ORDER — LISINOPRIL 40 MG PO TABS: 40.0000 mg | ORAL_TABLET | Freq: Every day | ORAL | 3 refills | Status: DC

## 2020-03-10 MED ORDER — METRONIDAZOLE 500 MG PO TABS: 500.0000 mg | ORAL_TABLET | Freq: Two times a day (BID) | ORAL | 5 refills | Status: DC

## 2020-03-10 MED ORDER — PRAVASTATIN SODIUM 40 MG PO TABS: 40.0000 mg | ORAL_TABLET | Freq: Every day | ORAL | 3 refills | Status: DC

## 2020-03-10 MED ORDER — VALACYCLOVIR HCL 500 MG PO TABS: 500.0000 mg | ORAL_TABLET | Freq: Every day | ORAL | 3 refills | Status: DC

## 2020-03-10 MED ORDER — METOPROLOL SUCCINATE ER 25 MG PO TB24: 25.0000 mg | ORAL_TABLET | Freq: Every day | ORAL | 3 refills | Status: DC

## 2020-03-10 NOTE — Telephone Encounter (Signed)
err

## 2020-03-10 NOTE — Patient Instructions (Addendum)
10/25/2019 Telephone [facility]  [address]  [address]  [place], [place] [address]  [phone]  Barkley Boards, MD  [address]  [address]  [place], [place] [address]  [phone]  [phone] (Fax)  Scheduled Appointment   Consider vaccines Tdap vaccine* moderna vaccine booster pneumonia 23 vaccine *  ?hep B vaccine  ADD BACK 1/2 LISINOPRIL PILL X X 3 DAYS THEN INCREASE TO 1 PILL DAILY  bp GOAL <130/<80 AND AT DAY 4 IF STILL HIGH ADD BACK METOPROLOL 25 MG QD   DR. Odis Luster LEFT KNEE PAIN     Journal for Nurse Practitioners, 15(4), 761-607. Retrieved January 08, 2018 from http://clinicalkey.com/nursing">  Knee Exercises Ask your health care provider which exercises are safe for you. Do exercises exactly as told by your health care provider and adjust them as directed. It is normal to feel mild stretching, pulling, tightness, or discomfort as you do these exercises. Stop right away if you feel sudden pain or your pain gets worse. Do not begin these exercises until told by your health care provider. Stretching and range-of-motion exercises These exercises warm up your muscles and joints and improve the movement and flexibility of your knee. These exercises also help to relieve pain and swelling. Knee extension, prone 1. Lie on your abdomen (prone position) on a bed. 2. Place your left / right knee just beyond the edge of the surface so your knee is not on the bed. You can put a towel under your left / right thigh just above your kneecap for comfort. 3. Relax your leg muscles and allow gravity to straighten your knee (extension). You should feel a stretch behind your left / right knee. 4. Hold this position for __________ seconds. 5. Scoot up so your knee is supported between repetitions. Repeat __________ times. Complete this exercise __________ times a day. Knee flexion, active  1. Lie on your back with both legs  straight. If this causes back discomfort, bend your left / right knee so your foot is flat on the floor. 2. Slowly slide your left / right heel back toward your buttocks. Stop when you feel a gentle stretch in the front of your knee or thigh (flexion). 3. Hold this position for __________ seconds. 4. Slowly slide your left / right heel back to the starting position. Repeat __________ times. Complete this exercise __________ times a day. Quadriceps stretch, prone  1. Lie on your abdomen on a firm surface, such as a bed or padded floor. 2. Bend your left / right knee and hold your ankle. If you cannot reach your ankle or pant leg, loop a belt around your foot and grab the belt instead. 3. Gently pull your heel toward your buttocks. Your knee should not slide out to the side. You should feel a stretch in the front of your thigh and knee (quadriceps). 4. Hold this position for __________ seconds. Repeat __________ times. Complete this exercise __________ times a day. Hamstring, supine 1. Lie on your back (supine position). 2. Loop a belt or towel over the ball of your left / right foot. The ball of your foot is on the walking surface, right under your toes. 3. Straighten your left / right knee and slowly pull on the belt to raise your leg until you feel a gentle stretch behind your knee (hamstring). ? Do not let your knee bend while you do this. ? Keep your other leg flat on the floor. 4.  Hold this position for __________ seconds. Repeat __________ times. Complete this exercise __________ times a day. Strengthening exercises These exercises build strength and endurance in your knee. Endurance is the ability to use your muscles for a long time, even after they get tired. Quadriceps, isometric This exercise stretches the muscles in front of your thigh (quadriceps) without moving your knee joint (isometric). 1. Lie on your back with your left / right leg extended and your other knee bent. Put a  rolled towel or small pillow under your knee if told by your health care provider. 2. Slowly tense the muscles in the front of your left / right thigh. You should see your kneecap slide up toward your hip or see increased dimpling just above the knee. This motion will push the back of the knee toward the floor. 3. For __________ seconds, hold the muscle as tight as you can without increasing your pain. 4. Relax the muscles slowly and completely. Repeat __________ times. Complete this exercise __________ times a day. Straight leg raises This exercise stretches the muscles in front of your thigh (quadriceps) and the muscles that move your hips (hip flexors). 1. Lie on your back with your left / right leg extended and your other knee bent. 2. Tense the muscles in the front of your left / right thigh. You should see your kneecap slide up or see increased dimpling just above the knee. Your thigh may even shake a bit. 3. Keep these muscles tight as you raise your leg 4-6 inches (10-15 cm) off the floor. Do not let your knee bend. 4. Hold this position for __________ seconds. 5. Keep these muscles tense as you lower your leg. 6. Relax your muscles slowly and completely after each repetition. Repeat __________ times. Complete this exercise __________ times a day. Hamstring, isometric 1. Lie on your back on a firm surface. 2. Bend your left / right knee about __________ degrees. 3. Dig your left / right heel into the surface as if you are trying to pull it toward your buttocks. Tighten the muscles in the back of your thighs (hamstring) to "dig" as hard as you can without increasing any pain. 4. Hold this position for __________ seconds. 5. Release the tension gradually and allow your muscles to relax completely for __________ seconds after each repetition. Repeat __________ times. Complete this exercise __________ times a day. Hamstring curls If told by your health care provider, do this exercise while  wearing ankle weights. Begin with __________ lb weights. Then increase the weight by 1 lb (0.5 kg) increments. Do not wear ankle weights that are more than __________ lb. 1. Lie on your abdomen with your legs straight. 2. Bend your left / right knee as far as you can without feeling pain. Keep your hips flat against the floor. 3. Hold this position for __________ seconds. 4. Slowly lower your leg to the starting position. Repeat __________ times. Complete this exercise __________ times a day. Squats This exercise strengthens the muscles in front of your thigh and knee (quadriceps). 1. Stand in front of a table, with your feet and knees pointing straight ahead. You may rest your hands on the table for balance but not for support. 2. Slowly bend your knees and lower your hips like you are going to sit in a chair. ? Keep your weight over your heels, not over your toes. ? Keep your lower legs upright so they are parallel with the table legs. ? Do not let your hips  go lower than your knees. ? Do not bend lower than told by your health care provider. ? If your knee pain increases, do not bend as low. 3. Hold the squat position for __________ seconds. 4. Slowly push with your legs to return to standing. Do not use your hands to pull yourself to standing. Repeat __________ times. Complete this exercise __________ times a day. Wall slides This exercise strengthens the muscles in front of your thigh and knee (quadriceps). 1. Lean your back against a smooth wall or door, and walk your feet out 18-24 inches (46-61 cm) from it. 2. Place your feet hip-width apart. 3. Slowly slide down the wall or door until your knees bend __________ degrees. Keep your knees over your heels, not over your toes. Keep your knees in line with your hips. 4. Hold this position for __________ seconds. Repeat __________ times. Complete this exercise __________ times a day. Straight leg raises This exercise strengthens the  muscles that rotate the leg at the hip and move it away from your body (hip abductors). 1. Lie on your side with your left / right leg in the top position. Lie so your head, shoulder, knee, and hip line up. You may bend your bottom knee to help you keep your balance. 2. Roll your hips slightly forward so your hips are stacked directly over each other and your left / right knee is facing forward. 3. Leading with your heel, lift your top leg 4-6 inches (10-15 cm). You should feel the muscles in your outer hip lifting. ? Do not let your foot drift forward. ? Do not let your knee roll toward the ceiling. 4. Hold this position for __________ seconds. 5. Slowly return your leg to the starting position. 6. Let your muscles relax completely after each repetition. Repeat __________ times. Complete this exercise __________ times a day. Straight leg raises This exercise stretches the muscles that move your hips away from the front of the pelvis (hip extensors). 1. Lie on your abdomen on a firm surface. You can put a pillow under your hips if that is more comfortable. 2. Tense the muscles in your buttocks and lift your left / right leg about 4-6 inches (10-15 cm). Keep your knee straight as you lift your leg. 3. Hold this position for __________ seconds. 4. Slowly lower your leg to the starting position. 5. Let your leg relax completely after each repetition. Repeat __________ times. Complete this exercise __________ times a day. This information is not intended to replace advice given to you by your health care provider. Make sure you discuss any questions you have with your health care provider. Document Revised: 01/09/2018 Document Reviewed: 01/09/2018 Elsevier Patient Education  2020 [employer].

## 2020-03-10 NOTE — Progress Notes (Signed)
Chief Complaint  Patient presents with  . Follow-up   Follow up HTN  1. HTN BP elevated today no BP meds per pt at home dbp 70-80s and she was not taking all meds on lis 40 and norvasc 5,  toprol xl 25 and spironolactone 50 mg qd, not taking lis nor spironlactone advised she should be on all meds  She did not have any meds today  With morbid obesity she has lost weight via diet and exercise declines seeing Dr. Kreg Shropshire for weight loss surgery for now and wants to try on her own lifestyle changes   2. C/o mild to moderate  left knee pain will refer urgently to [facility] she has tried steroid shots in the past did not help and interested in gel injections  3. 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  HENT: Negative for hearing loss.   Eyes: Negative for blurred vision.  Respiratory: Negative for shortness of breath.   Cardiovascular: Negative for chest pain.  Gastrointestinal: Negative for abdominal pain.  Musculoskeletal: Positive for joint pain.  Skin: Negative for rash.  Neurological: Negative for headaches.  Psychiatric/Behavioral: Negative for depression.   Past Medical History:  Diagnosis Date  . Alcohol abuse   . Arthritis   . Cervical radiculopathy   . Diabetes mellitus without complication ([place]) 2197  . Herpes    1 and 2 since 44 y.o   . Hyperlipidemia   . Hypertension   . Morbid obesity ([place])   . OSA (obstructive sleep apnea)    Not on CPAP  . Psoriasis   . Tobacco abuse    No past surgical history on file. Family History  Problem Relation Age of Onset  . Alcohol abuse Maternal Grandmother   . Cancer Maternal Grandmother        Ovarian  . Hypertension Maternal Grandmother   . Diabetes Maternal Grandmother   . Hypertension Mother   . Diabetes Mother   . Colon cancer Other 60  . Breast cancer Other        + FH  . Aneurysm Maternal Grandfather   . Alcohol abuse Maternal  Grandfather    Social History   Socioeconomic History  . Marital status: Single    Spouse name: Not on file  . Number of children: 0  . Years of education: Not on file  . Highest education level: Not on file  Occupational History  . Not on file  Tobacco Use  . Smoking status: Former Smoker    Packs/day: 0.50    Types: Cigarettes  . Smokeless tobacco: Never Used  . Tobacco comment: stopped 11/2015  Vaping Use  . Vaping Use: Never used  Substance and Sexual Activity  . Alcohol use: Yes    Alcohol/week: 0.0 standard drinks    Comment: occasionally  . Drug use: No  . Sexual activity: Yes    Partners: Male    Birth control/protection: None    Comment: 1 partner  Other Topics Concern  . 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    Associates Degree   No children    No caffeine   Social Determinants of Health   Financial Resource Strain: Not on file  Food Insecurity: Not on file  Transportation Needs: Not on file  Physical Activity: Not on file  Stress: Not on file  Social Connections: Not on file  Intimate Partner Violence: Not on file   Current Meds  Medication Sig  . acetaminophen (TYLENOL) 650 MG CR tablet Take 650 mg by mouth every 8 (eight) hours as needed for pain.   Marland Kitchen albuterol (VENTOLIN HFA) 108 (90 Base) MCG/ACT inhaler Inhale 1-2 puffs into the lungs every 4 (four) hours as needed for wheezing or shortness of breath.  Marland Kitchen amLODipine (NORVASC) 5 MG tablet Take 1 tablet (5 mg total) by mouth daily.  Marland Kitchen aspirin 81 MG tablet Take 81 mg by mouth daily.  Marland Kitchen aspirin-acetaminophen-caffeine (EXCEDRIN MIGRAINE) 250-250-65 MG tablet Take 1 tablet by mouth every 6 (six) hours as needed for headache.  . Cholecalciferol 50000 units capsule Take 1 capsule (50,000 Units total) by mouth once a week.  . COSENTYX SENSOREADY, 300 MG, 150 MG/ML SOAJ   . cyclobenzaprine (FLEXERIL) 5 MG tablet Take 1 tablet (5 mg total)  by mouth daily as needed for muscle spasms.  Marland Kitchen lisinopril (ZESTRIL) 40 MG tablet Take 1 tablet (40 mg total) by mouth daily.  . metFORMIN (GLUCOPHAGE) 500 MG tablet Bid with food  . metoprolol succinate (TOPROL-XL) 25 MG 24 hr tablet Take 1 tablet (25 mg total) by mouth daily. At night  . metroNIDAZOLE (FLAGYL) 500 MG tablet Take 1 tablet (500 mg total) by mouth 2 (two) times daily. With food  . pravastatin (PRAVACHOL) 40 MG tablet Take 1 tablet (40 mg total) by mouth daily.  Marland Kitchen spironolactone (ALDACTONE) 50 MG tablet Take 1 tablet (50 mg total) by mouth daily. In am  . valACYclovir (VALTREX) 500 MG tablet Take 1 tablet (500 mg total) by mouth daily. Bid x 5 days prn  . [DISCONTINUED] clotrimazole-betamethasone (LOTRISONE) cream Apply 1 application topically 2 (two) times daily.  . [DISCONTINUED] lisinopril (ZESTRIL) 40 MG tablet Take 1 tablet (40 mg total) by mouth daily.  . [DISCONTINUED] metoprolol succinate (TOPROL-XL) 25 MG 24 hr tablet Take 1 tablet (25 mg total) by mouth daily. At night  . [DISCONTINUED] metroNIDAZOLE (FLAGYL) 500 MG tablet Take 1 tablet (500 mg total) by mouth 2 (two) times daily. With food  . [DISCONTINUED] mupirocin ointment (BACTROBAN) 2 % Apply 1 application topically 2 (two) times daily. Breast  . [DISCONTINUED] pravastatin (PRAVACHOL) 40 MG tablet Take 1 tablet (40 mg total) by mouth daily.  . [DISCONTINUED] valACYclovir (VALTREX) 500 MG tablet Take 1 tablet (500 mg total) by mouth daily. Bid x 5 days prn   No Known Allergies Recent Results (from the past 2160 hour(s))  Cervicovaginal ancillary only     Status: Abnormal   Collection Time: 12/18/19  8:28 PM  Result Value Ref Range   Neisseria Gonorrhea Negative    Chlamydia Negative    Trichomonas Negative    Bacterial Vaginitis (gardnerella) Positive (A)    Candida Vaginitis Negative    Candida Glabrata Negative    Comment Normal Reference Range Candida Species - Negative    Comment Normal Reference Range  Candida Galbrata - Negative    Comment Normal Reference Range Trichomonas - Negative    Comment Normal Reference [place] Chlamydia - Negative    Comment      Normal Reference Range Neisseria Gonorrhea - Negative   Comment      Normal Reference Range Bacterial Vaginosis - Negative  HIV Antibody (routine testing w rflx)     Status: None   Collection Time: 12/18/19  8:28 PM  Result Value Ref Range   HIV Screen  4th Generation wRfx Non Reactive Non Reactive  RPR     Status: None   Collection Time: 12/18/19  8:28 PM  Result Value Ref Range   RPR Ser Ql Non Reactive Non Reactive   Objective  Body mass index is 48.74 kg/m. Wt Readings from Last 3 Encounters:  03/10/20 286 lb 3.2 oz (129.8 kg)  10/03/19 (!) 304 lb (137.9 kg)  05/31/19 (!) 318 lb (144.2 kg)   Temp Readings from Last 3 Encounters:  03/10/20 98.1 F (36.7 C) (Oral)  10/03/19 98.6 F (37 C) (Oral)  03/16/18 98.9 F (37.2 C) (Oral)   BP Readings from Last 3 Encounters:  03/10/20 (!) 150/100  10/03/19 (!) 154/96  05/31/19 140/80   Pulse Readings from Last 3 Encounters:  03/10/20 85  10/03/19 81  02/20/19 88    Physical Exam Vitals and nursing note reviewed.  Constitutional:      Appearance: Normal appearance. She is well-developed and well-groomed. She is morbidly obese.  HENT:     Head: Normocephalic and atraumatic.  Eyes:     Conjunctiva/sclera: Conjunctivae normal.     Pupils: Pupils are equal, round, and reactive to light.  Cardiovascular:     Rate and Rhythm: Normal rate and regular rhythm.     Heart sounds: Normal heart sounds. No murmur heard.   Pulmonary:     Effort: Pulmonary effort is normal.     Breath sounds: Normal breath sounds.  Skin:    General: Skin is warm and dry.  Neurological:     General: No focal deficit present.     Mental Status: She is alert and oriented to person, place, and time. Mental status is at baseline.     Gait: Gait normal.  Psychiatric:        Attention and  Perception: Attention and perception normal.        Mood and Affect: Mood and affect normal.        Speech: Speech normal.        Behavior: Behavior normal. Behavior is cooperative.        Thought Content: Thought content normal.        Cognition and Memory: Cognition and memory normal.        Judgment: Judgment normal.     Assessment  Plan  Essential hypertension - Plan: lisinopril (ZESTRIL) 40 MG tablet, metoprolol succinate (TOPROL-XL) 25 MG 24 hr tablet norvasc 5 mg qd  spironolactone 50 mg qd  ADD BACK 1/2 LISINOPRIL PILL X X 3 DAYS THEN INCREASE TO 1 PILL DAILY  bp GOAL <130/<80 AND AT DAY 4 IF STILL HIGH ADD BACK METOPROLOL 25 MG QD  BP check in 2-4 weeks after taking meds as above   Bacterial vaginosis - Plan: metroNIDAZOLE (FLAGYL) 500 MG tablet  Herpes infection - Plan: valACYclovir (VALTREX) 500 MG tablet  Hyperlipidemia, unspecified hyperlipidemia type - Plan: pravastatin (PRAVACHOL) 40 MG tablet  Arthritis of left knee - Plan: Ambulatory referral to [facility] pt no showed for referral appt per [facility]  Hypertension associated with diabetes ([place]) - Plan: Comprehensive metabolic panel, Lipid panel, CBC with Differential/Platelet, Hemoglobin A1c See meds above  Labs [facility] Monitor BP  Metformin 500 mg bid   Iron deficiency anemia, unspecified iron deficiency anemia type - Plan: CBC with Differential/Platelet, Iron, TIBC and Ferritin Panel  Vitamin D deficiency - Plan: Vitamin D (25 hydroxy)  B12 deficiency - Plan: Vitamin B12  Morbid obesity with BMI of 45.0-49.9, adult ([place])  Continue healthy diet and exercise   HM Fluutd Tdap had due again  pna 23 haddue again  covid 2/2 moderna   mammo11/30/20 with bx negative12/4/20 left breast clip -ordered pt to call and schedule  Pap 08/24/17 neg pap neg HPV  Colonoscopy consider age 44referred [place] no FH colon cancer   rec healthy diet and exercise rec MMR vx health  dept Consider hep B/C testing in futurewith labs add on when pt get labs Rec healthy diet and exercise     Provider: Dr. Olivia Mackie McLean-Scocuzza-[facility] Medicine

## 2020-03-16 ENCOUNTER — Telehealth: Payer: Self-pay | Admitting: Internal Medicine

## 2020-03-16 NOTE — Telephone Encounter (Signed)
Rejection Reason - Patient was No Show" [place], [place] said on Mar 16, 2020 2:39 PM

## 2020-03-17 DIAGNOSIS — M1712 Unilateral primary osteoarthritis, left knee: Secondary | ICD-10-CM | POA: Insufficient documentation

## 2020-03-19 NOTE — Progress Notes (Signed)
Faxed a records realeas for [facility] exam [facility], confirmation given.  Sharlon Pfohl,cma

## 2020-03-23 ENCOUNTER — Encounter: Payer: Self-pay | Admitting: Internal Medicine

## 2020-04-02 ENCOUNTER — Encounter: Payer: Self-pay | Admitting: Internal Medicine

## 2020-04-08 ENCOUNTER — Ambulatory Visit: Payer: Managed Care, Other (non HMO)

## 2020-04-16 ENCOUNTER — Encounter: Payer: Self-pay | Admitting: Internal Medicine

## 2020-04-27 ENCOUNTER — Encounter: Payer: Self-pay | Admitting: Internal Medicine

## 2020-05-14 ENCOUNTER — Encounter: Payer: Self-pay | Admitting: Neurology

## 2020-05-14 ENCOUNTER — Ambulatory Visit: Payer: Managed Care, Other (non HMO) | Admitting: Neurology

## 2020-05-14 ENCOUNTER — Other Ambulatory Visit: Payer: Self-pay

## 2020-05-14 VITALS — BP 138/83 | HR 82 | Ht 64.25 in | Wt 279.5 lb

## 2020-05-14 DIAGNOSIS — G43709 Chronic migraine without aura, not intractable, without status migrainosus: Secondary | ICD-10-CM | POA: Diagnosis not present

## 2020-05-14 MED ORDER — VENLAFAXINE HCL ER 75 MG PO CP24: 75.0000 mg | ORAL_CAPSULE | Freq: Every day | ORAL | 11 refills | Status: DC

## 2020-05-14 MED ORDER — SUMATRIPTAN SUCCINATE 50 MG PO TABS: 50.0000 mg | ORAL_TABLET | ORAL | 6 refills | Status: DC | PRN

## 2020-05-14 NOTE — Progress Notes (Signed)
Chief Complaint  Patient presents with  . New Patient (Initial Visit)    Reports having some type of headache daily. She takes OTC NSAIDS for her more severe pain but they are not always helpful. She sometimes has nausea. She has never been on any preventive medication.    HISTORICAL  Nicole Mendez is a 45 year old female, seen in request by Dr. Bevelyn Buckles for evaluation of frequent headaches, initial evaluation was on May 14, 2020  I reviewed and summarized the referring note.  Medical history Hypertension Hyperlipidemia DM  Patient had long history of migraine headaches, used to have it to times each months, but increase the headaches since 2020, now she has migraine headache 2-3 times a week, lateralized retro-orbital area headache with light noise sensitivity, throbbing, nauseous lasting for hours, she has been taking frequent Excedrin migraine with only temporary improvement  I personally reviewed CT head without contrast May 2017 that was normal  Laboratory evaluation seen 2021, negative HIV, RPR, vitamin D 28.4, A1c 6.7, normal TSH, lipid profile LDL 73, hemoglobin 10.3,  CMP creatinine of 0.61,    REVIEW OF SYSTEMS: Full 14 system review of systems performed and notable only for as above All other review of systems were negative.  ALLERGIES: No Known Allergies  HOME MEDICATIONS: Current Outpatient Medications  Medication Sig Dispense Refill  . acetaminophen (TYLENOL) 650 MG CR tablet Take 650 mg by mouth every 8 (eight) hours as needed for pain.     Marland Kitchen albuterol (VENTOLIN HFA) 108 (90 Base) MCG/ACT inhaler Inhale 1-2 puffs into the lungs every 4 (four) hours as needed for wheezing or shortness of breath. 18 g 2  . amLODipine (NORVASC) 5 MG tablet Take 1 tablet (5 mg total) by mouth daily. 90 tablet 3  . aspirin 81 MG tablet Take 81 mg by mouth daily.    Marland Kitchen aspirin-acetaminophen-caffeine (EXCEDRIN MIGRAINE) 250-250-65 MG tablet Take 1 tablet by  mouth every 6 (six) hours as needed for headache.    . Cholecalciferol 50000 units capsule Take 1 capsule (50,000 Units total) by mouth once a week. 13 capsule 1  . COSENTYX SENSOREADY, 300 MG, 150 MG/ML SOAJ     . cyclobenzaprine (FLEXERIL) 5 MG tablet Take 1 tablet (5 mg total) by mouth daily as needed for muscle spasms. 90 tablet 0  . lisinopril (ZESTRIL) 40 MG tablet Take 1 tablet (40 mg total) by mouth daily. 90 tablet 3  . metFORMIN (GLUCOPHAGE) 500 MG tablet Bid with food 180 tablet 3  . metoprolol succinate (TOPROL-XL) 25 MG 24 hr tablet Take 1 tablet (25 mg total) by mouth daily. At night 90 tablet 3  . pravastatin (PRAVACHOL) 40 MG tablet Take 1 tablet (40 mg total) by mouth daily. 90 tablet 3  . spironolactone (ALDACTONE) 50 MG tablet Take 1 tablet (50 mg total) by mouth daily. In am 90 tablet 3  . valACYclovir (VALTREX) 500 MG tablet Take 1 tablet (500 mg total) by mouth daily. Bid x 5 days prn 90 tablet 3   No current facility-administered medications for this visit.    PAST MEDICAL HISTORY: Past Medical History:  Diagnosis Date  . Alcohol abuse   . Arthritis   . Cervical radiculopathy   . Diabetes mellitus without complication (HCC) 2000  . Headache   . Herpes    1 and 2 since 45 y.o   . Hyperlipidemia   . Hypertension   . Morbid obesity (HCC)   . OSA (obstructive sleep apnea)  Not on CPAP  . Psoriasis   . Tobacco abuse     PAST SURGICAL HISTORY: Past Surgical History:  Procedure Laterality Date  . No past surgery.      FAMILY HISTORY: Family History  Problem Relation Age of Onset  . Alcohol abuse Maternal Grandmother   . Cancer Maternal Grandmother        Ovarian  . Hypertension Maternal Grandmother   . Diabetes Maternal Grandmother   . Hypertension Mother   . Diabetes Mother   . Colon cancer Other 80  . Breast cancer Other        + FH  . Aneurysm Maternal Grandfather   . Alcohol abuse Maternal Grandfather   . Other Father        unsure of  medical history    SOCIAL HISTORY: Social History   Socioeconomic History  . Marital status: Single    Spouse name: Not on file  . Number of children: 0  . Years of education: college  . Highest education level: Associate degree: academic program  Occupational History  . Occupation: customer service  Tobacco Use  . Smoking status: Former Smoker    Packs/day: 0.50    Types: Cigarettes  . Smokeless tobacco: Never Used  . Tobacco comment: stopped 11/2015  Vaping Use  . Vaping Use: Never used  Substance and Sexual Activity  . Alcohol use: Yes    Comment: occasionally  . Drug use: No  . Sexual activity: Yes    Partners: Male    Birth control/protection: None    Comment: 1 partner  Other Topics Concern  . Not on file  Social History Narrative   Lives with her mother.   Right-handed.   No daily caffeine use.   Social Determinants of Health   Financial Resource Strain: Not on file  Food Insecurity: Not on file  Transportation Needs: Not on file  Physical Activity: Not on file  Stress: Not on file  Social Connections: Not on file  Intimate Partner Violence: Not on file     PHYSICAL EXAM   Vitals:   05/14/20 1051  BP: 138/83  Pulse: 82  Weight: 279 lb 8 oz (126.8 kg)  Height: 5' 4.25" (1.632 m)   Not recorded     Body mass index is 47.6 kg/m.  PHYSICAL EXAMNIATION:  Gen: NAD, conversant, well nourised, well groomed                     Cardiovascular: Regular rate rhythm, no peripheral edema, warm, nontender. Eyes: Conjunctivae clear without exudates or hemorrhage Neck: Supple, no carotid bruits. Pulmonary: Clear to auscultation bilaterally   NEUROLOGICAL EXAM:  MENTAL STATUS: Speech:    Speech is normal; fluent and spontaneous with normal comprehension.  Cognition:     Orientation to time, place and person     Normal recent and remote memory     Normal Attention span and concentration     Normal Language, naming, repeating,spontaneous speech      Fund of knowledge   CRANIAL NERVES: CN II: Visual fields are full to confrontation. Pupils are round equal and briskly reactive to light. CN III, IV, VI: extraocular movement are normal. No ptosis. CN V: Facial sensation is intact to light touch CN VII: Face is symmetric with normal eye closure  CN VIII: Hearing is normal to causal conversation. CN IX, X: Phonation is normal. CN XI: Head turning and shoulder shrug are intact  MOTOR: There is no pronator drift  of out-stretched arms. Muscle bulk and tone are normal. Muscle strength is normal.  REFLEXES: Reflexes are 2+ and symmetric at the biceps, triceps, knees, and ankles. Plantar responses are flexor.  SENSORY: Intact to light touch, pinprick and vibratory sensation are intact in fingers and toes.  COORDINATION: There is no trunk or limb dysmetria noted.  GAIT/STANCE: Posture is normal. Gait is steady with normal steps, base, arm swing, and turning. Heel and toe walking are normal. Tandem gait is normal.  Romberg is absent.   DIAGNOSTIC DATA (LABS, IMAGING, TESTING) - I reviewed patient records, labs, notes, testing and imaging myself where available.   ASSESSMENT AND PLAN  Nicole Mendez is a 45 y.o. female    Chronic migraine  Also component of medicine rebound headaches  Start preventive medication Effexor XR 75 mg daily  Imitrex 50 mg as needed   Levert Feinstein, M.D. Ph.D.  [facility] [address] [place], [place] [address] Ph: [phone] Fax: [phone]  CC:  McLean-Scocuzza, Pasty Spillers, MD [address] [place],  [place] [address]

## 2020-05-18 ENCOUNTER — Other Ambulatory Visit: Payer: Self-pay

## 2020-05-18 ENCOUNTER — Ambulatory Visit
Admission: RE | Admit: 2020-05-18 | Discharge: 2020-05-18 | Disposition: A | Discharge status: Home / Self Care | Payer: Managed Care, Other (non HMO) | Source: Ambulatory Visit | Attending: Family Medicine | Admitting: Family Medicine

## 2020-05-18 VITALS — BP 167/89 | HR 98 | Temp 98.9°F | Resp 18

## 2020-05-18 DIAGNOSIS — Z87891 Personal history of nicotine dependence: Secondary | ICD-10-CM | POA: Diagnosis not present

## 2020-05-18 DIAGNOSIS — Z7982 Long term (current) use of aspirin: Secondary | ICD-10-CM | POA: Diagnosis not present

## 2020-05-18 DIAGNOSIS — Z113 Encounter for screening for infections with a predominantly sexual mode of transmission: Secondary | ICD-10-CM

## 2020-05-18 DIAGNOSIS — Z79899 Other long term (current) drug therapy: Secondary | ICD-10-CM | POA: Diagnosis not present

## 2020-05-18 LAB — POCT URINALYSIS DIP (MANUAL ENTRY)
Bilirubin, UA: NEGATIVE
Blood, UA: NEGATIVE
Glucose, UA: NEGATIVE mg/dL
Ketones, POC UA: NEGATIVE mg/dL
Leukocytes, UA: NEGATIVE
Nitrite, UA: NEGATIVE
Protein Ur, POC: NEGATIVE mg/dL
Spec Grav, UA: 1.025 (ref 1.010–1.025)
Urobilinogen, UA: 0.2 E.U./dL
pH, UA: 7 (ref 5.0–8.0)

## 2020-05-18 LAB — POCT URINE PREGNANCY: Preg Test, Ur: NEGATIVE

## 2020-05-18 NOTE — ED Triage Notes (Signed)
Patient presents to Urgent Care for STD testing. Pt denies any symptoms.

## 2020-05-18 NOTE — Discharge Instructions (Addendum)
We have screened you for STDs.  Swab sent for testing and blood work sent for HIV and syphilis.  You can check your my chart for results.

## 2020-05-19 LAB — HIV ANTIBODY (ROUTINE TESTING W REFLEX): HIV Screen 4th Generation wRfx: NONREACTIVE

## 2020-05-19 LAB — RPR: RPR Ser Ql: NONREACTIVE

## 2020-05-19 NOTE — ED Provider Notes (Signed)
[facility]    CSN: [id number] Arrival date & time: 05/18/20  1511      History   Chief Complaint Chief Complaint  Patient presents with  . SEXUALLY TRANSMITTED DISEASE    HPI Nicole Mendez is a 45 y.o. female.   Patient is a 45 year old female presents today for STD screening.  She is currently denying any symptoms.       Past Medical History:  Diagnosis Date  . Alcohol abuse   . Arthritis   . Cervical radiculopathy   . Diabetes mellitus without complication (HCC) 2000  . Headache   . Herpes    1 and 2 since 45 y.o   . Hyperlipidemia   . Hypertension   . Morbid obesity (HCC)   . OSA (obstructive sleep apnea)    Not on CPAP  . Psoriasis   . Tobacco abuse     Patient Active Problem List   Diagnosis Date Noted  . Chronic migraine w/o aura w/o status migrainosus, not intractable 05/14/2020  . Arthritis of left knee 03/17/2020  . Hypertension associated with diabetes (HCC) 10/03/2019  . Trigger finger of left thumb 05/31/2019  . Cellulitis of left breast 05/31/2019  . GAD (generalized anxiety disorder) 02/21/2019  . Prediabetes 02/21/2019  . COVID-19 01/16/2019  . Plantar fasciitis of right foot 01/30/2018  . GERD (gastroesophageal reflux disease) 08/24/2017  . Acute pain of left shoulder 03/20/2017  . Cervical radiculopathy 03/20/2017  . Anemia 08/22/2016  . Annual physical exam 02/02/2016  . Chronic headache 08/20/2015  . Alcohol abuse   . Cervical radiculopathy at C6 07/19/2015  . Trapezius strain 04/21/2015  . Essential hypertension 08/20/2014  . OSA on CPAP 08/20/2014  . Type 2 diabetes mellitus (HCC) 08/20/2014  . Hyperlipidemia 01/10/2008  . Morbid obesity with BMI of 45.0-49.9, adult (HCC) 01/10/2008  . Bacterial vaginosis 12/28/2007  . PSORIASIS 12/17/2007  . ALLERGIC RHINITIS 09/24/2007  . HSV (herpes simplex virus) infection 07/03/2004    Past Surgical History:  Procedure Laterality Date  . No past surgery.      OB  History    Gravida  0   Para  0   Term  0   Preterm  0   AB  0   Living  0     SAB  0   IAB  0   Ectopic  0   Multiple  0   Live Births               Home Medications    Prior to Admission medications   Medication Sig Start Date End Date Taking? Authorizing Provider  acetaminophen (TYLENOL) 650 MG CR tablet Take 650 mg by mouth every 8 (eight) hours as needed for pain.     [provider]  albuterol (VENTOLIN HFA) 108 (90 Base) MCG/ACT inhaler Inhale 1-2 puffs into the lungs every 4 (four) hours as needed for wheezing or shortness of breath. 01/29/19   McLean-Scocuzza, Pasty Spillers, MD  amLODipine (NORVASC) 5 MG tablet Take 1 tablet (5 mg total) by mouth daily. 01/14/20   McLean-Scocuzza, Pasty Spillers, MD  aspirin 81 MG tablet Take 81 mg by mouth daily.    [provider]  aspirin-acetaminophen-caffeine (EXCEDRIN MIGRAINE) [phone] MG tablet Take 1 tablet by mouth every 6 (six) hours as needed for headache.    [provider]  Cholecalciferol 50000 units capsule Take 1 capsule (50,000 Units total) by mouth once a week. 08/25/17   McLean-Scocuzza, Pasty Spillers,  MD  COSENTYX SENSOREADY, 300 MG, 150 MG/ML SOAJ  01/16/19   [provider]  cyclobenzaprine (FLEXERIL) 5 MG tablet Take 1 tablet (5 mg total) by mouth daily as needed for muscle spasms. 08/24/17   McLean-Scocuzza, Pasty Spillers, MD  lisinopril (ZESTRIL) 40 MG tablet Take 1 tablet (40 mg total) by mouth daily. 03/10/20   McLean-Scocuzza, Pasty Spillers, MD  metFORMIN (GLUCOPHAGE) 500 MG tablet Bid with food 09/04/19   McLean-Scocuzza, Pasty Spillers, MD  metoprolol succinate (TOPROL-XL) 25 MG 24 hr tablet Take 1 tablet (25 mg total) by mouth daily. At night 03/10/20   McLean-Scocuzza, Pasty Spillers, MD  pravastatin (PRAVACHOL) 40 MG tablet Take 1 tablet (40 mg total) by mouth daily. 03/10/20   McLean-Scocuzza, Pasty Spillers, MD  spironolactone (ALDACTONE) 50 MG tablet Take 1 tablet (50 mg total) by mouth daily. In am 10/03/19    McLean-Scocuzza, Pasty Spillers, MD  SUMAtriptan (IMITREX) 50 MG tablet Take 1 tablet (50 mg total) by mouth every 2 (two) hours as needed. May repeat in 2 hours if headache persists or recurs. 05/14/20   Levert Feinstein, MD  valACYclovir (VALTREX) 500 MG tablet Take 1 tablet (500 mg total) by mouth daily. Bid x 5 days prn 03/10/20   McLean-Scocuzza, Pasty Spillers, MD  venlafaxine XR (EFFEXOR XR) 75 MG 24 hr capsule Take 1 capsule (75 mg total) by mouth daily with breakfast. 05/14/20   Levert Feinstein, MD    Family History Family History  Problem Relation Age of Onset  . Alcohol abuse Maternal Grandmother   . Cancer Maternal Grandmother        Ovarian  . Hypertension Maternal Grandmother   . Diabetes Maternal Grandmother   . Hypertension Mother   . Diabetes Mother   . Colon cancer Other 80  . Breast cancer Other        + FH  . Aneurysm Maternal Grandfather   . Alcohol abuse Maternal Grandfather   . Other Father        unsure of medical history    Social History Social History   Tobacco Use  . Smoking status: Former Smoker    Packs/day: 0.50    Types: Cigarettes  . Smokeless tobacco: Never Used  . Tobacco comment: stopped 11/2015  Vaping Use  . Vaping Use: Never used  Substance Use Topics  . Alcohol use: Yes    Comment: occasionally  . Drug use: No     Allergies   Patient has no known allergies.   Review of Systems Review of Systems  Constitutional: Negative.   Gastrointestinal: Negative.   Genitourinary: Negative.      Physical Exam Triage Vital Signs ED Triage Vitals  Enc Vitals Group     BP 05/18/20 1535 (!) 167/89     Pulse Rate 05/18/20 1535 98     Resp 05/18/20 1535 18     Temp 05/18/20 1535 98.9 F (37.2 C)     Temp Source 05/18/20 1535 Oral     SpO2 05/18/20 1535 97 %     Weight --      Height --      Head Circumference --      Peak Flow --      Pain Score 05/18/20 1529 0     Pain Loc --      Pain Edu? --      Excl. in GC? --    No data found.  Updated Vital  Signs BP (!) 167/89 (BP Location: Left Arm)  Pulse 98   Temp 98.9 F (37.2 C) (Oral)   Resp 18   SpO2 97%   Visual Acuity Right Eye Distance:   Left Eye Distance:   Bilateral Distance:    Right Eye Near:   Left Eye Near:    Bilateral Near:     Physical Exam Vitals and nursing note reviewed.  Constitutional:      General: She is not in acute distress.    Appearance: Normal appearance. She is not ill-appearing, toxic-appearing or diaphoretic.  HENT:     Head: Normocephalic.  Eyes:     Conjunctiva/sclera: Conjunctivae normal.  Pulmonary:     Effort: Pulmonary effort is normal.  Musculoskeletal:        General: Normal range of motion.     Cervical back: Normal range of motion.  Skin:    General: Skin is warm and dry.     Findings: No rash.  Neurological:     Mental Status: She is alert.  Psychiatric:        Mood and Affect: Mood normal.      UC Treatments / Results  Labs (all labs ordered are listed, but only abnormal results are displayed) Labs Reviewed  POCT URINALYSIS DIP (MANUAL ENTRY) - Abnormal; Notable for the following components:      Result Value   Clarity, UA cloudy (*)    All other components within normal limits  HIV ANTIBODY (ROUTINE TESTING W REFLEX)   Narrative:    Performed at:  [facility] [address] [place], [place]  [id number] Lab Director: Jolene Schimke MD, Phone:  [phone]  RPR   Narrative:    Performed at:  [address] [place] [address], [place], [place]  [id number] Lab Director: Jolene Schimke MD, Phone:  [phone]  POCT URINE PREGNANCY  CERVICOVAGINAL ANCILLARY ONLY    EKG   Radiology No results found.  Procedures Procedures (including critical care time)  Medications Ordered in UC Medications - No data to display  Initial Impression / Assessment and Plan / UC Course  I have reviewed the triage vital signs and the nursing notes.  Pertinent labs & imaging results that were available during  my care of the patient were reviewed by me and considered in my medical decision making (see chart for details).     STD screening Swab sent for testing and blood work done for HIV and syphilis. Urine negative for pregnancy or UTI today. Check my chart for results Follow up as needed for continued or worsening symptoms  Final Clinical Impressions(s) / UC Diagnoses   Final diagnoses:  Screening examination for STD (sexually transmitted disease)     Discharge Instructions     We have screened you for STDs.  Swab sent for testing and blood work sent for HIV and syphilis.  You can check your my chart for results.    ED Prescriptions    None     PDMP not reviewed this encounter.   Janace Aris, NP 05/19/20 [phone]

## 2020-05-20 LAB — CERVICOVAGINAL ANCILLARY ONLY
Bacterial Vaginitis (gardnerella): NEGATIVE
Candida Glabrata: NEGATIVE
Candida Vaginitis: NEGATIVE
Chlamydia: NEGATIVE
Comment: NEGATIVE
Comment: NEGATIVE
Comment: NEGATIVE
Comment: NEGATIVE
Comment: NEGATIVE
Comment: NORMAL
Neisseria Gonorrhea: NEGATIVE
Trichomonas: NEGATIVE

## 2020-05-21 ENCOUNTER — Ambulatory Visit: Payer: Self-pay

## 2020-06-01 ENCOUNTER — Telehealth: Payer: Self-pay | Admitting: Neurology

## 2020-06-01 NOTE — Telephone Encounter (Signed)
Patient is requesting a copy of her recent office visit to provide to her employer.

## 2020-06-01 NOTE — Telephone Encounter (Signed)
Pt. is asking for a letter stating she has headaches. Please advise.

## 2020-06-02 NOTE — Telephone Encounter (Signed)
Done emailed to pt.

## 2020-06-18 ENCOUNTER — Telehealth: Payer: Self-pay | Admitting: *Deleted

## 2020-06-18 DIAGNOSIS — Z0289 Encounter for other administrative examinations: Secondary | ICD-10-CM

## 2020-06-18 NOTE — Telephone Encounter (Signed)
Pt [employer] form faxed on 06/18/20 [phone]

## 2020-06-25 ENCOUNTER — Encounter: Payer: Self-pay | Admitting: Internal Medicine

## 2020-07-01 ENCOUNTER — Telehealth: Payer: Self-pay | Admitting: *Deleted

## 2020-07-01 NOTE — Telephone Encounter (Signed)
I re faxed [employer] form to [phone]

## 2020-07-06 ENCOUNTER — Ambulatory Visit: Payer: Managed Care, Other (non HMO) | Admitting: Obstetrics & Gynecology

## 2020-07-09 ENCOUNTER — Other Ambulatory Visit: Payer: Self-pay

## 2020-07-09 ENCOUNTER — Telehealth (INDEPENDENT_AMBULATORY_CARE_PROVIDER_SITE_OTHER): Payer: Managed Care, Other (non HMO) | Admitting: Internal Medicine

## 2020-07-09 ENCOUNTER — Encounter: Payer: Self-pay | Admitting: Internal Medicine

## 2020-07-09 VITALS — Ht 64.25 in | Wt 274.0 lb

## 2020-07-09 DIAGNOSIS — I152 Hypertension secondary to endocrine disorders: Secondary | ICD-10-CM | POA: Diagnosis not present

## 2020-07-09 DIAGNOSIS — E1159 Type 2 diabetes mellitus with other circulatory complications: Secondary | ICD-10-CM | POA: Diagnosis not present

## 2020-07-09 DIAGNOSIS — Z111 Encounter for screening for respiratory tuberculosis: Secondary | ICD-10-CM

## 2020-07-09 DIAGNOSIS — Z1211 Encounter for screening for malignant neoplasm of colon: Secondary | ICD-10-CM

## 2020-07-09 NOTE — Progress Notes (Signed)
telephone Note  I connected with Nicole Mendez  on 07/09/20 at  4:02 PM EDT by telephone application and verified that I am speaking with the correct person using two identifiers.  Location patient: work Environmental [employer] or home office Persons participating in the virtual visit: patient, provider  I discussed the limitations of evaluation and management by telemedicine and the availability of in person appointments. The patient expressed understanding and agreed to proceed.   HPI: 1. HTN per pt controlled norvasc 5 mg qd lis 40 mg qd, spironolactone 50 mg qd, toprol xl 25 mg qd 2. DM 2 6.7 on metformin 500 mg bid  ACEI lis above, pravachol 40 mg qhs  3. No kids with desire to have kids will f/u ob/gyn 07/24/20 has new partner since 01/2020   -COVID-19 vaccine status: 2/2  ROS: See pertinent positives and negatives per HPI.  Past Medical History:  Diagnosis Date  . Alcohol abuse   . Arthritis   . Cervical radiculopathy   . Diabetes mellitus without complication ([place]) 5038  . Headache   . Herpes    1 and 2 since 45 y.o   . Hyperlipidemia   . Hypertension   . Morbid obesity ([place])   . OSA (obstructive sleep apnea)    Not on CPAP  . Psoriasis   . Tobacco abuse     Past Surgical History:  Procedure Laterality Date  . No past surgery.       Current Outpatient Medications:  .  acetaminophen (TYLENOL) 650 MG CR tablet, Take 650 mg by mouth every 8 (eight) hours as needed for pain. , Disp: , Rfl:  .  amLODipine (NORVASC) 5 MG tablet, Take 1 tablet (5 mg total) by mouth daily., Disp: 90 tablet, Rfl: 3 .  aspirin 81 MG tablet, Take 81 mg by mouth daily., Disp: , Rfl:  .  Cholecalciferol 50000 units capsule, Take 1 capsule (50,000 Units total) by mouth once a week., Disp: 13 capsule, Rfl: 1 .  COSENTYX SENSOREADY, 300 MG, 150 MG/ML SOAJ, , Disp: , Rfl:  .  cyclobenzaprine (FLEXERIL) 5 MG tablet, Take 1 tablet (5 mg total) by mouth daily as needed for muscle spasms.,  Disp: 90 tablet, Rfl: 0 .  lisinopril (ZESTRIL) 40 MG tablet, Take 1 tablet (40 mg total) by mouth daily., Disp: 90 tablet, Rfl: 3 .  metFORMIN (GLUCOPHAGE) 500 MG tablet, Bid with food, Disp: 180 tablet, Rfl: 3 .  metoprolol succinate (TOPROL-XL) 25 MG 24 hr tablet, Take 1 tablet (25 mg total) by mouth daily. At night, Disp: 90 tablet, Rfl: 3 .  pravastatin (PRAVACHOL) 40 MG tablet, Take 1 tablet (40 mg total) by mouth daily., Disp: 90 tablet, Rfl: 3 .  spironolactone (ALDACTONE) 50 MG tablet, Take 1 tablet (50 mg total) by mouth daily. In am, Disp: 90 tablet, Rfl: 3 .  SUMAtriptan (IMITREX) 50 MG tablet, Take 1 tablet (50 mg total) by mouth every 2 (two) hours as needed. May repeat in 2 hours if headache persists or recurs., Disp: 12 tablet, Rfl: 6 .  valACYclovir (VALTREX) 500 MG tablet, Take 1 tablet (500 mg total) by mouth daily. Bid x 5 days prn, Disp: 90 tablet, Rfl: 3 .  venlafaxine XR (EFFEXOR XR) 75 MG 24 hr capsule, Take 1 capsule (75 mg total) by mouth daily with breakfast., Disp: 30 capsule, Rfl: 11 .  albuterol (VENTOLIN HFA) 108 (90 Base) MCG/ACT inhaler, Inhale 1-2 puffs into the lungs every 4 (four) hours as needed for  wheezing or shortness of breath. (Patient not taking: Reported on 07/09/2020), Disp: 18 g, Rfl: 2  EXAM:  VITALS per patient if applicable:  GENERAL: alert, oriented, appears well and in no acute distress  PSYCH/NEURO: pleasant and cooperative, no obvious depression or anxiety, speech and thought processing grossly intact  ASSESSMENT AND PLAN:  Discussed the following assessment and plan:  Hypertension associated with diabetes (HCC) norvasc 5 mg qd lis 40 mg qd, spironolactone 50 mg qd, toprol xl 25 mg qd metformin 500 mg bid  pravachol 40 mg qhs  Needs fasting labs asap   HM Fluutd Tdap haddue again pna 23 haddue again covid2/2 moderna consider booster 46rd dose   mammo11/30/20 with bx negative12/4/20 left breast clip -ordered pt to call and  schedule [place]  Pap 08/24/17 neg pap neg HPV f/u ob/gyn 07/24/20   Colonoscopy consider age 45referred [facility] no FH colon cancer   rec healthy diet and exercise  rec MMR vx health dept Consider hep B/C testing in [place] labs add on when pt get labs Rec healthy diet and exercise  -we discussed possible serious and likely etiologies, options for evaluation and workup, limitations of telemedicine visit vs in person visit, treatment, treatment risks and precautions.     I discussed the assessment and treatment plan with the patient. The patient was provided an opportunity to ask questions and all were answered. The patient agreed with the plan and demonstrated an understanding of the instructions.    Time spent 20 min Delorise Jackson, MD

## 2020-07-24 ENCOUNTER — Encounter: Payer: Self-pay | Admitting: Obstetrics & Gynecology

## 2020-07-24 ENCOUNTER — Other Ambulatory Visit: Payer: Self-pay

## 2020-07-24 ENCOUNTER — Ambulatory Visit: Payer: Managed Care, Other (non HMO) | Admitting: Obstetrics & Gynecology

## 2020-07-24 VITALS — BP 140/90 | Ht 61.0 in | Wt 276.0 lb

## 2020-07-24 DIAGNOSIS — N979 Female infertility, unspecified: Secondary | ICD-10-CM

## 2020-07-24 NOTE — Progress Notes (Signed)
Gynecology Infertility Exam  PCP: McLean-Scocuzza, Pasty Spillers, MD  Chief Complaint:  Chief Complaint  Patient presents with  . Consult    History of Present Illness: Patient is a 45 y.o. G0P0000 presenting for evaluation of infertility. Patient and partner have been attempting conception for 8 months.  But she has been in other relationships and not used birth control since her 19s and never been pregnat. Marital Status: single but w partner. Pregnancies with current partner no  Menstrual and Endocrine History LMP: Patient's last menstrual period was 06/16/2020. Menarche:10 Shortest Interval: 25 Longest Interval: 32  days Duration of flow: several days Heavy Menses: no Clots: no Intermenstrual Bleeding: no Postcoital Bleeding: no Dysmenorrhea: yes Amenorrhea: not applicable Wt Change: no Hirsutism: no Balding: no Acne: no Galactorrhea: no  Obstetrical History Never pregnant  Gynecologic History Last PAP: 2019 Previous abdominal or pelvic surgery: no Pelvic Pain:  no Endometriosis: no Hot Flashes: no DES Exposure: no Abnormal Pap: no Cervix Cryo/cone: no STD: no PID: no  Infertility and Endocrine Studies BBT: no Endo. Bx.:no HSG: no PCT: no Laparoscopy: no Hormonal Studies: no Semen analysis: no Other Studies: no Meds: none Other Therapies: Not applicable Insemination:not applicable  Sexual History Frequency: a few times per month(s) Satisfied: yes Dyspareunia: no  Contraception None  Family History Thyroid Problems: no Heart Condition or High Blood Pressure: no Blood Clot or Stroke: no Diabetes: no Cancer: no Birth Defects/Inherited diseases:no Infectious diseases (mumps, TB, Rubella):no Other Medical Problems: no MR/autism/fragile X or POF: no  Habits Cigarettes:    Wife -  no    Husband - no Alcohol:    Wife -  no    Husband - no Marijuana: no  PMHx: She  has a past medical history of Alcohol abuse, Arthritis, Cervical  radiculopathy, Diabetes mellitus without complication (HCC) (2000), Headache, Herpes, Hyperlipidemia, Hypertension, Morbid obesity (HCC), OSA (obstructive sleep apnea), Psoriasis, and Tobacco abuse. Also,  has a past surgical history that includes No past surgery.., family history includes Alcohol abuse in her maternal grandfather and maternal grandmother; Aneurysm in her maternal grandfather; Breast cancer in an other family member; Cancer in her maternal grandmother; Colon cancer (age of onset: 5) in an other family member; Diabetes in her maternal grandmother and mother; Hypertension in her maternal grandmother and mother; Other in her father.,  reports that she has quit smoking. Her smoking use included cigarettes. She smoked 0.50 packs per day. She has never used smokeless tobacco. She reports current alcohol use. She reports that she does not use drugs.  She has a current medication list which includes the following prescription(s): acetaminophen, albuterol, amlodipine, aspirin, cholecalciferol, cosentyx sensoready (300 mg), cyclobenzaprine, lisinopril, metformin, metoprolol succinate, pravastatin, spironolactone, sumatriptan, valacyclovir, and venlafaxine xr. Also, has No Known Allergies.  Review of Systems  Constitutional: Negative for chills, fever and malaise/fatigue.  HENT: Negative for congestion, sinus pain and sore throat.   Eyes: Negative for blurred vision and pain.  Respiratory: Negative for cough and wheezing.   Cardiovascular: Negative for chest pain and leg swelling.  Gastrointestinal: Negative for abdominal pain, constipation, diarrhea, heartburn, nausea and vomiting.  Genitourinary: Negative for dysuria, frequency, hematuria and urgency.  Musculoskeletal: Negative for back pain, joint pain, myalgias and neck pain.  Skin: Negative for itching and rash.  Neurological: Negative for dizziness, tremors and weakness.  Endo/Heme/Allergies: Does not bruise/bleed easily.   Psychiatric/Behavioral: Negative for depression. The patient is not nervous/anxious and does not have insomnia.     Objective: BP 140/90  Ht 5\' 1"  (1.549 m)   Wt 276 lb (125.2 kg)   LMP 06/16/2020   BMI 52.15 kg/m  Physical Exam Constitutional:      General: She is not in acute distress.    Appearance: She is well-developed.  Musculoskeletal:        General: Normal range of motion.  Neurological:     Mental Status: She is alert and oriented to person, place, and time.  Skin:    General: Skin is warm and dry.  Vitals reviewed.      Assessment: 45 y.o. G0P0000 1. Infertility, female - FSH/LH - Anti mullerian hormone - Testosterone,Free and Total - 48 PELVIC COMPLETE WITH TRANSVAGINAL; Future - Ambulatory referral to Infertility - PAP due soon, at PCP or here  Plan referral based on age to expedite testing and interventions Risks of infertility and AMA pregnancy discussed  Plan: 1) We discussed the underlying etiologies which may be implicated in a couple experiencing difficulty conceiving.  The average couple will conceive within the span of 1 year with unprotected coitus, with a monthly fecundity rate of 20% or 1 in 5.  Even without further work up or intervention the patient and her partner may be successful in conceiving unassisted, although if an underlying etiology can be identified and addressed fecundity rate may improve.  The work up entails examining for ovulatory function, tubal patency, and ruling out female factor infertility.  These may be looked at concurrently or sequentially.  The downside of sequential work up is that this method may miss issues if more than one compartment is contributing.  She is aware that tubal factor or moderate to severe female factor infertility will require further consultation with a reproductive endocrinologist.  In the case of anovulation, use of Clomid (clomiphen citrate) or Femara (letrazole) were discussed with the understanding the  the later is an off-label, but well supported use.  With either of these drugs the risk of multiples increases from the standard population rate of 2% to approximately 10%, with higher order multiples possible but unlikely.  Both drugs may require some time to titrate to the appropriate dosage to ensure consistent ovulation.  Cycles will be limited to 6 cycles on each drug secondary to decreasing rates of conception after 6 cycles.  In addition should patient be started on ovulation induction with Clomid she was advised to discontinue the drug for any vision changes as this is a rare but potentially permanent side-effect if medication is continued.  We discussed timing of intercourse as well as the use of ovulation predictor kits identify the patient's fertile window each month.     2) Preconception counseling - immunization up to date.  The patient denies any family history of conditions which would warrant preconception genetic counseling or testing on her or her partner.  Instructed to start prenatal vitamins while trying to conceive.    [place], MD, [facility] Major [facility], [facility] 07/24/2020  2:46 PM

## 2020-07-24 NOTE — Patient Instructions (Signed)
Thank you for choosing [facility]. As part of our ongoing efforts to improve patient experience, we would appreciate your feedback. Please fill out the short survey that you will receive by mail or MyChart. Your opinion is important to us! -Dr Asusena Sigley  Female Infertility  Female infertility refers to a woman's inability to get pregnant (conceive) after a year of having sex regularly (or after 6 months in women over age 46) without using birth control. Infertility can also mean that a woman is not able to carry a pregnancy to full term. Both women and men can have fertility problems. What are the causes? This condition may be caused by:  Problems with reproductive organs. Infertility can result if a woman: ? Has an abnormally short cervix or a cervix that does not remain closed during a pregnancy. ? Has a blockage or scarring in the fallopian tubes. ? Has an abnormally shaped uterus. ? Has uterine fibroids. This is a benign mass of tissue or muscle (tumor) that can develop in the uterus. ? Is not ovulating in a regular way.  Certain medical conditions. These may include: ? Polycystic ovary syndrome (PCOS). This is a hormonal disorder that can cause small cysts to grow on the ovaries. This is the most common cause of infertility in women. ? Endometriosis. This is a condition in which the tissue that lines the uterus (endometrium) grows outside of its normal location. ? Cancer and cancer treatments, such as chemotherapy or radiation. ? Premature ovarian failure. This is when ovaries stop producing eggs and hormones before age 40. ? Sexually transmitted diseases, such as chlamydia or gonorrhea. ? Autoimmune disorders. These are disorders in which the body's defense system (immune system) attacks normal, healthy cells. Infertility can be linked to more than one cause. For some women, the cause of infertility is not known (unexplained infertility). What increases the risk?  Age. A woman's  fertility declines with age, especially after her mid-30s.  Being underweight or overweight.  Drinking too much alcohol.  Using drugs such as anabolic steroids, cocaine, and marijuana.  Exercising excessively.  Being exposed to environmental toxins, such as radiation, pesticides, and certain chemicals. What are the signs or symptoms? The main sign of infertility in women is the inability to get pregnant or carry a pregnancy to full term. How is this diagnosed? This condition may be diagnosed by:  Checking whether you are ovulating each month. The tests may include: ? Blood tests to check hormone levels. ? An ultrasound of the ovaries. ? Taking a small tissue that lines the uterus and checking it under a microscope (endometrial biopsy).  Doing additional tests. This is done if ovulation is normal. Tests may include: ? Hysterosalpingography. This X-ray test can show the shape of the uterus and whether the fallopian tubes are open. ? Laparoscopy. This test uses a lighted tube (laparoscope) to look for problems in the fallopian tubes and other organs. ? Transvaginal ultrasound. This imaging test is used to check for abnormalities in the uterus and ovaries. ? Hysteroscopy. This test uses a lighted tube to check for problems in the cervix and the uterus. To be diagnosed with infertility, both partners will have a physical exam. Both partners will also have an extensive medical and sexual history taken. Additional tests may be done. How is this treated? Treatment depends on the cause of infertility. Most cases of infertility in women are treated with medicine or surgery.  Women may take medicine to: ? Correct ovulation problems. ? Treat   other health conditions.  Surgery may be done to: ? Repair damage to the ovaries, fallopian tubes, cervix, or uterus. ? Remove growths from the uterus. ? Remove scar tissue from the uterus, pelvis, or other organs. Assisted reproductive technology  (ART) Assisted reproductive technology (ART) refers to all treatments and procedures that combine eggs and sperm outside the body to try to help a couple conceive. ART is often combined with fertility drugs to stimulate ovulation. Sometimes ART is done using eggs retrieved from another woman's body (donor eggs) or from previously frozen fertilized eggs (embryos). There are different types of ART. These include:  Intrauterine insemination (IUI). A long, thin tube is used to place sperm directly into a woman's uterus. This procedure: ? Is effective for infertility caused by sperm problems, including low sperm count and low motility. ? Can be used in combination with fertility drugs.  In vitro fertilization (IVF). This is done when a woman's fallopian tubes are blocked or when a man has low sperm count. In this procedure: ? Fertility drugs are used to stimulate the ovaries to produce multiple eggs. ? Once mature, these eggs are removed from the body and combined with the sperm to be fertilized. ? The fertilized eggs are then placed into the woman's uterus. Follow these instructions at home:  Take over-the-counter and prescription medicines only as told by your health care provider.  Do not use any products that contain nicotine or tobacco, such as cigarettes and e-cigarettes. If you need help quitting, ask your health care provider.  If you drink alcohol, limit how much you have to 1 drink a day.  Make dietary changes to lose weight or maintain a healthy weight. Work with your health care provider and a dietitian to set a weight-loss goal that is healthy and reasonable for you.  Seek support from a counselor or support group to talk about your concerns related to infertility. Couples counseling may be helpful for you and your partner.  Practice stress reduction techniques that work well for you, such as regular physical activity, meditation, or deep breathing.  Keep all follow-up visits as  told by your health care provider. This is important. Contact a health care provider if you:  Feel that stress is interfering with your life and relationships.  Have side effects from treatments for infertility. Summary  Female infertility refers to a woman's inability to get pregnant (conceive) after a year of having sex regularly (or after 6 months in women over age 46) without using birth control.  To be diagnosed with infertility, both partners will have a physical exam. Both partners will also have an extensive medical and sexual history taken.  Seek support from a counselor or support group to talk about your concerns related to infertility. Couples counseling may be helpful for you and your partner. This information is not intended to replace advice given to you by your health care provider. Make sure you discuss any questions you have with your health care provider. Document Revised: 01/02/2020 Document Reviewed: 11/22/2019 Elsevier Patient Education  2021 Elsevier Inc.  

## 2020-07-31 ENCOUNTER — Other Ambulatory Visit: Payer: Self-pay | Admitting: Internal Medicine

## 2020-07-31 DIAGNOSIS — E1165 Type 2 diabetes mellitus with hyperglycemia: Secondary | ICD-10-CM

## 2020-07-31 LAB — FSH/LH
FSH: 4 m[IU]/mL
LH: 6.7 m[IU]/mL

## 2020-07-31 LAB — ANTI MULLERIAN HORMONE: ANTI-MULLERIAN HORMONE (AMH): 0.059 ng/mL

## 2020-07-31 LAB — TESTOSTERONE,FREE AND TOTAL
Testosterone, Free: 0.9 pg/mL (ref 0.0–4.2)
Testosterone: <3 ng/dL — ABNORMAL LOW (ref 4–50)

## 2020-08-10 NOTE — Progress Notes (Deleted)
No chief complaint on file.   HISTORICAL  Nicole Mendez is a 45 year old female, seen in request by Dr. Bevelyn Buckles for evaluation of frequent headaches, initial evaluation was on May 14, 2020  I reviewed and summarized the referring note.  Medical history Hypertension Hyperlipidemia DM  Patient had long history of migraine headaches, used to have it to times each months, but increase the headaches since 2020, now she has migraine headache 2-3 times a week, lateralized retro-orbital area headache with light noise sensitivity, throbbing, nauseous lasting for hours, she has been taking frequent Excedrin migraine with only temporary improvement  I personally reviewed CT head without contrast May 2017 that was normal  Laboratory evaluation seen 2021, negative HIV, RPR, vitamin D 28.4, A1c 6.7, normal TSH, lipid profile LDL 73, hemoglobin 10.3,  CMP creatinine of 0.61   Update Aug 11, 2020 SS:    REVIEW OF SYSTEMS: Full 14 system review of systems performed and notable only for as above All other review of systems were negative.  ALLERGIES: No Known Allergies  HOME MEDICATIONS: Current Outpatient Medications  Medication Sig Dispense Refill  . acetaminophen (TYLENOL) 650 MG CR tablet Take 650 mg by mouth every 8 (eight) hours as needed for pain.     Marland Kitchen albuterol (VENTOLIN HFA) 108 (90 Base) MCG/ACT inhaler Inhale 1-2 puffs into the lungs every 4 (four) hours as needed for wheezing or shortness of breath. 18 g 2  . amLODipine (NORVASC) 5 MG tablet Take 1 tablet (5 mg total) by mouth daily. 90 tablet 3  . aspirin 81 MG tablet Take 81 mg by mouth daily.    . Cholecalciferol 50000 units capsule Take 1 capsule (50,000 Units total) by mouth once a week. 13 capsule 1  . COSENTYX SENSOREADY, 300 MG, 150 MG/ML SOAJ     . cyclobenzaprine (FLEXERIL) 5 MG tablet Take 1 tablet (5 mg total) by mouth daily as needed for muscle spasms. 90 tablet 0  . lisinopril (ZESTRIL) 40  MG tablet Take 1 tablet (40 mg total) by mouth daily. 90 tablet 3  . metFORMIN (GLUCOPHAGE) 500 MG tablet TAKE 1 TABLET BY MOUTH  TWICE DAILY WITH FOOD 180 tablet 3  . metoprolol succinate (TOPROL-XL) 25 MG 24 hr tablet Take 1 tablet (25 mg total) by mouth daily. At night 90 tablet 3  . pravastatin (PRAVACHOL) 40 MG tablet Take 1 tablet (40 mg total) by mouth daily. 90 tablet 3  . spironolactone (ALDACTONE) 50 MG tablet Take 1 tablet (50 mg total) by mouth daily. In am 90 tablet 3  . SUMAtriptan (IMITREX) 50 MG tablet Take 1 tablet (50 mg total) by mouth every 2 (two) hours as needed. May repeat in 2 hours if headache persists or recurs. 12 tablet 6  . valACYclovir (VALTREX) 500 MG tablet Take 1 tablet (500 mg total) by mouth daily. Bid x 5 days prn 90 tablet 3  . venlafaxine XR (EFFEXOR XR) 75 MG 24 hr capsule Take 1 capsule (75 mg total) by mouth daily with breakfast. 30 capsule 11   No current facility-administered medications for this visit.    PAST MEDICAL HISTORY: Past Medical History:  Diagnosis Date  . Alcohol abuse   . Arthritis   . Cervical radiculopathy   . Diabetes mellitus without complication (HCC) 2000  . Headache   . Herpes    1 and 2 since 45 y.o   . Hyperlipidemia   . Hypertension   . Morbid obesity (HCC)   .  OSA (obstructive sleep apnea)    Not on CPAP  . Psoriasis   . Tobacco abuse     PAST SURGICAL HISTORY: Past Surgical History:  Procedure Laterality Date  . No past surgery.      FAMILY HISTORY: Family History  Problem Relation Age of Onset  . Alcohol abuse Maternal Grandmother   . Cancer Maternal Grandmother        Ovarian  . Hypertension Maternal Grandmother   . Diabetes Maternal Grandmother   . Hypertension Mother   . Diabetes Mother   . Colon cancer Other 80  . Breast cancer Other        + FH  . Aneurysm Maternal Grandfather   . Alcohol abuse Maternal Grandfather   . Other Father        unsure of medical history    SOCIAL  HISTORY: Social History   Socioeconomic History  . Marital status: Single    Spouse name: Not on file  . Number of children: 0  . Years of education: college  . Highest education level: Associate degree: academic program  Occupational History  . Occupation: customer service  Tobacco Use  . Smoking status: Former Smoker    Packs/day: 0.50    Types: Cigarettes  . Smokeless tobacco: Never Used  . Tobacco comment: stopped 11/2015  Vaping Use  . Vaping Use: Never used  Substance and Sexual Activity  . Alcohol use: Yes    Comment: occasionally  . Drug use: No  . Sexual activity: Yes    Partners: Male    Birth control/protection: None    Comment: 1 partner  Other Topics Concern  . Not on file  Social History Narrative   Lives with her mother.   Right-handed.   No daily caffeine use.   Social Determinants of Health   Financial Resource Strain: Not on file  Food Insecurity: Not on file  Transportation Needs: Not on file  Physical Activity: Not on file  Stress: Not on file  Social Connections: Not on file  Intimate Partner Violence: Not on file     PHYSICAL EXAM   There were no vitals filed for this visit. Not recorded     There is no height or weight on file to calculate BMI.  PHYSICAL EXAMNIATION:  Gen: NAD, conversant, well nourised, well groomed                     Cardiovascular: Regular rate rhythm, no peripheral edema, warm, nontender. Eyes: Conjunctivae clear without exudates or hemorrhage Neck: Supple, no carotid bruits. Pulmonary: Clear to auscultation bilaterally   NEUROLOGICAL EXAM:  MENTAL STATUS: Speech:    Speech is normal; fluent and spontaneous with normal comprehension.  Cognition:     Orientation to time, place and person     Normal recent and remote memory     Normal Attention span and concentration     Normal Language, naming, repeating,spontaneous speech     Fund of knowledge   CRANIAL NERVES: CN II: Visual fields are full to  confrontation. Pupils are round equal and briskly reactive to light. CN III, IV, VI: extraocular movement are normal. No ptosis. CN V: Facial sensation is intact to light touch CN VII: Face is symmetric with normal eye closure  CN VIII: Hearing is normal to causal conversation. CN IX, X: Phonation is normal. CN XI: Head turning and shoulder shrug are intact  MOTOR: There is no pronator drift of out-stretched arms. Muscle bulk and tone  are normal. Muscle strength is normal.  REFLEXES: Reflexes are 2+ and symmetric at the biceps, triceps, knees, and ankles. Plantar responses are flexor.  SENSORY: Intact to light touch, pinprick and vibratory sensation are intact in fingers and toes.  COORDINATION: There is no trunk or limb dysmetria noted.  GAIT/STANCE: Posture is normal. Gait is steady with normal steps, base, arm swing, and turning. Heel and toe walking are normal. Tandem gait is normal.  Romberg is absent.   DIAGNOSTIC DATA (LABS, IMAGING, TESTING) - I reviewed patient records, labs, notes, testing and imaging myself where available.   ASSESSMENT AND PLAN  Nicole Mendez is a 45 y.o. female    Chronic migraine  Also component of medicine rebound headaches  Start preventive medication Effexor XR 75 mg daily  Imitrex 50 mg as needed   Levert Feinstein, M.D. Ph.D.  [facility] [address], [address] [place], [place] [address] Ph: [phone] Fax: [phone]  CC:  McLean-Scocuzza, Pasty Spillers, MD [address] [place],  [place] [address]

## 2020-08-12 ENCOUNTER — Encounter: Payer: Self-pay | Admitting: Neurology

## 2020-08-12 ENCOUNTER — Ambulatory Visit: Payer: Managed Care, Other (non HMO) | Admitting: Neurology

## 2020-08-17 ENCOUNTER — Ambulatory Visit: Payer: Managed Care, Other (non HMO) | Admitting: Dermatology

## 2020-08-17 ENCOUNTER — Other Ambulatory Visit: Payer: Self-pay

## 2020-08-17 ENCOUNTER — Ambulatory Visit
Admission: RE | Admit: 2020-08-17 | Discharge: 2020-08-17 | Disposition: A | Discharge status: Home / Self Care | Payer: Managed Care, Other (non HMO) | Source: Ambulatory Visit | Attending: Obstetrics & Gynecology | Admitting: Obstetrics & Gynecology

## 2020-08-17 DIAGNOSIS — L409 Psoriasis, unspecified: Secondary | ICD-10-CM | POA: Diagnosis not present

## 2020-08-17 DIAGNOSIS — N979 Female infertility, unspecified: Secondary | ICD-10-CM | POA: Diagnosis present

## 2020-08-17 MED ORDER — FLUOCINOLONE ACETONIDE BODY 0.01 % EX OIL: 1.0000 "application " | TOPICAL_OIL | Freq: Every day | CUTANEOUS | 4 refills | Status: AC

## 2020-08-17 MED ORDER — KETOCONAZOLE 2 % EX SHAM: 1.0000 "application " | MEDICATED_SHAMPOO | CUTANEOUS | 3 refills | Status: DC

## 2020-08-17 NOTE — Progress Notes (Signed)
   Follow-Up Visit   Subjective  Nicole Mendez is a 45 y.o. female who presents for the following: Psoriasis (Scalp, body, cosentyx has been off around 1 month). Cosentyx helps, but is no longer covered by her insurance and is too expensive, $250 copay   The following portions of the chart were reviewed this encounter and updated as appropriate:       Review of Systems:  No other skin or systemic complaints except as noted in HPI or Assessment and Plan.  Objective  Well appearing patient in no apparent distress; mood and affect are within normal limits.  A focused examination was performed including face, arms, legs. Relevant physical exam findings are noted in the Assessment and Plan.  Objective  Scalp, body: Hyperpigmented scaly plaques L calf, elbows, hyperpigmented scaly paps R and L lower legs, hypopigmented scaly patches perioral  3% BSA off Cosentyx x 1 mo   Assessment & Plan  Psoriasis Scalp, body  With Psoriatic Arthritis, knees  Psoriasis - severe on systemic "biologic" treatment injections.  Psoriasis is a chronic non-curable, but treatable genetic/hereditary disease that may have other systemic features affecting other organ systems such as joints (Psoriatic Arthritis).  It is linked with heart disease, inflammatory bowel disease, non-alcoholic fatty liver disease, and depression. Significant skin psoriasis and/or psoriatic arthritis may have significant symptoms and affects activities of daily activity and often benefits from systemic "biologic" injection treatments.  These "biologic" treatments have some potential side effects including immunosuppression and require pre-treatment laboratory screening and periodic laboratory monitoring and periodic in person evaluation and monitoring by the attending dermatologist physician.   Start Ketoconazole 2% shampoo 1x/wk let sit 5 minutes and rinse out Start Derma-Smooth FS oil qd to aa scalp and body  D/c Cosentyx due  to insurance reasons (too expensive) Pending labs/approval pt will start Taltz loading dose, then maintenance  Reviewed risks of biologics including immunosuppression, infections, injection site reaction, and failure to improve condition. Goal is control of skin condition, not cure.  Some older biologics such as Humira and Enbrel may slightly increase risk of malignancy and may worsen congestive heart failure. The use of biologics requires long term medication management, including periodic office visits and monitoring of blood work.   ketoconazole (NIZORAL) 2 % shampoo - Scalp, body  Fluocinolone Acetonide Body 0.01 % OIL - Scalp, body  Other Related Procedures Comprehensive metabolic panel CBC with Differential/Platelet QuantiFERON-TB Gold Plus  Return in about 6 months (around 02/17/2021) for Psoriasis f/u.   I, Ardis Rowan, RMA, am acting as scribe for Willeen Niece, MD . Documentation: I have reviewed the above documentation for accuracy and completeness, and I agree with the above.  Willeen Niece MD

## 2020-08-17 NOTE — Patient Instructions (Signed)

## 2020-08-18 LAB — IRON,TIBC AND FERRITIN PANEL
Ferritin: 7 ng/mL — ABNORMAL LOW (ref 15–150)
Iron Saturation: 4 % — CL (ref 15–55)
Iron: 19 ug/dL — ABNORMAL LOW (ref 27–159)
Total Iron Binding Capacity: 428 ug/dL (ref 250–450)
UIBC: 409 ug/dL (ref 131–425)

## 2020-08-18 LAB — COMPREHENSIVE METABOLIC PANEL
ALT: 10 IU/L (ref 0–32)
AST: 14 IU/L (ref 0–40)
Albumin/Globulin Ratio: 1.3 (ref 1.2–2.2)
Albumin: 3.9 g/dL (ref 3.8–4.8)
Alkaline Phosphatase: 105 IU/L (ref 44–121)
BUN/Creatinine Ratio: 14 (ref 9–23)
BUN: 9 mg/dL (ref 6–24)
Bilirubin Total: <0.2 mg/dL (ref 0.0–1.2)
CO2: 24 mmol/L (ref 20–29)
Calcium: 8.8 mg/dL (ref 8.7–10.2)
Chloride: 102 mmol/L (ref 96–106)
Creatinine, Ser: 0.63 mg/dL (ref 0.57–1.00)
Globulin, Total: 3.1 g/dL (ref 1.5–4.5)
Glucose: 115 mg/dL — ABNORMAL HIGH (ref 65–99)
Potassium: 4.3 mmol/L (ref 3.5–5.2)
Sodium: 141 mmol/L (ref 134–144)
Total Protein: 7 g/dL (ref 6.0–8.5)
eGFR: 111 mL/min/{1.73_m2} (ref >59)

## 2020-08-18 LAB — HEMOGLOBIN A1C
Est. average glucose Bld gHb Est-mCnc: 131 mg/dL
Hgb A1c MFr Bld: 6.2 % — ABNORMAL HIGH (ref 4.8–5.6)

## 2020-08-18 LAB — CBC WITH DIFFERENTIAL/PLATELET
Basophils Absolute: 0.1 10*3/uL (ref 0.0–0.2)
Basos: 1 %
EOS (ABSOLUTE): 0.1 10*3/uL (ref 0.0–0.4)
Eos: 2 %
Hematocrit: 31.9 % — ABNORMAL LOW (ref 34.0–46.6)
Hemoglobin: 10 g/dL — ABNORMAL LOW (ref 11.1–15.9)
Immature Grans (Abs): 0 10*3/uL (ref 0.0–0.1)
Immature Granulocytes: 0 %
Lymphocytes Absolute: 2.7 10*3/uL (ref 0.7–3.1)
Lymphs: 41 %
MCH: 23.6 pg — ABNORMAL LOW (ref 26.6–33.0)
MCHC: 31.3 g/dL — ABNORMAL LOW (ref 31.5–35.7)
MCV: 75 fL — ABNORMAL LOW (ref 79–97)
Monocytes Absolute: 0.5 10*3/uL (ref 0.1–0.9)
Monocytes: 8 %
Neutrophils Absolute: 3.2 10*3/uL (ref 1.4–7.0)
Neutrophils: 48 %
Platelets: 473 10*3/uL — ABNORMAL HIGH (ref 150–450)
RBC: 4.23 x10E6/uL (ref 3.77–5.28)
RDW: 14.5 % (ref 11.7–15.4)
WBC: 6.6 10*3/uL (ref 3.4–10.8)

## 2020-08-18 LAB — VITAMIN D 25 HYDROXY (VIT D DEFICIENCY, FRACTURES): Vit D, 25-Hydroxy: 21.9 ng/mL — ABNORMAL LOW (ref 30.0–100.0)

## 2020-08-18 LAB — VITAMIN B12: Vitamin B-12: 339 pg/mL (ref 232–1245)

## 2020-08-18 LAB — HEPATITIS C ANTIBODY: Hep C Virus Ab: <0.1 s/co ratio (ref 0.0–0.9)

## 2020-08-18 LAB — HEPATITIS B SURFACE ANTIBODY, QUANTITATIVE: Hepatitis B Surf Ab Quant: 16.7 m[IU]/mL (ref >9.9)

## 2020-08-18 LAB — LIPID PANEL
Chol/HDL Ratio: 3 ratio (ref 0.0–4.4)
Cholesterol, Total: 143 mg/dL (ref 100–199)
HDL: 47 mg/dL (ref >39)
LDL Chol Calc (NIH): 80 mg/dL (ref 0–99)
Triglycerides: 84 mg/dL (ref 0–149)
VLDL Cholesterol Cal: 16 mg/dL (ref 5–40)

## 2020-08-19 ENCOUNTER — Other Ambulatory Visit: Payer: Self-pay | Admitting: Internal Medicine

## 2020-08-19 ENCOUNTER — Encounter: Payer: Self-pay | Admitting: Internal Medicine

## 2020-08-19 DIAGNOSIS — E559 Vitamin D deficiency, unspecified: Secondary | ICD-10-CM | POA: Insufficient documentation

## 2020-08-19 DIAGNOSIS — D509 Iron deficiency anemia, unspecified: Secondary | ICD-10-CM | POA: Insufficient documentation

## 2020-08-19 MED ORDER — CHOLECALCIFEROL 1.25 MG (50000 UT) PO CAPS: 50000.0000 [IU] | ORAL_CAPSULE | ORAL | 1 refills | Status: DC

## 2020-08-20 ENCOUNTER — Ambulatory Visit: Payer: Managed Care, Other (non HMO) | Admitting: Obstetrics & Gynecology

## 2020-08-20 ENCOUNTER — Encounter: Payer: Self-pay | Admitting: Obstetrics & Gynecology

## 2020-08-20 ENCOUNTER — Other Ambulatory Visit: Payer: Self-pay

## 2020-08-20 VITALS — BP 170/100 | HR 86 | Ht 64.0 in | Wt 269.0 lb

## 2020-08-20 DIAGNOSIS — N979 Female infertility, unspecified: Secondary | ICD-10-CM

## 2020-08-20 LAB — COMPREHENSIVE METABOLIC PANEL
ALT: 11 IU/L (ref 0–32)
AST: 14 IU/L (ref 0–40)
Albumin/Globulin Ratio: 1.2 (ref 1.2–2.2)
Albumin: 3.8 g/dL (ref 3.8–4.8)
Alkaline Phosphatase: 101 IU/L (ref 44–121)
BUN/Creatinine Ratio: 15 (ref 9–23)
BUN: 9 mg/dL (ref 6–24)
Bilirubin Total: <0.2 mg/dL (ref 0.0–1.2)
CO2: 26 mmol/L (ref 20–29)
Calcium: 8.7 mg/dL (ref 8.7–10.2)
Chloride: 102 mmol/L (ref 96–106)
Creatinine, Ser: 0.61 mg/dL (ref 0.57–1.00)
Globulin, Total: 3.1 g/dL (ref 1.5–4.5)
Glucose: 112 mg/dL — ABNORMAL HIGH (ref 65–99)
Potassium: 4.4 mmol/L (ref 3.5–5.2)
Sodium: 140 mmol/L (ref 134–144)
Total Protein: 6.9 g/dL (ref 6.0–8.5)
eGFR: 112 mL/min/{1.73_m2} (ref >59)

## 2020-08-20 LAB — CBC WITH DIFFERENTIAL/PLATELET
Basophils Absolute: 0 10*3/uL (ref 0.0–0.2)
Basos: 1 %
EOS (ABSOLUTE): 0.1 10*3/uL (ref 0.0–0.4)
Eos: 2 %
Hematocrit: 33 % — ABNORMAL LOW (ref 34.0–46.6)
Hemoglobin: 9.8 g/dL — ABNORMAL LOW (ref 11.1–15.9)
Immature Grans (Abs): 0 10*3/uL (ref 0.0–0.1)
Immature Granulocytes: 0 %
Lymphocytes Absolute: 2.6 10*3/uL (ref 0.7–3.1)
Lymphs: 41 %
MCH: 23 pg — ABNORMAL LOW (ref 26.6–33.0)
MCHC: 29.7 g/dL — ABNORMAL LOW (ref 31.5–35.7)
MCV: 77 fL — ABNORMAL LOW (ref 79–97)
Monocytes Absolute: 0.5 10*3/uL (ref 0.1–0.9)
Monocytes: 8 %
Neutrophils Absolute: 3.1 10*3/uL (ref 1.4–7.0)
Neutrophils: 48 %
Platelets: 474 10*3/uL — ABNORMAL HIGH (ref 150–450)
RBC: 4.27 x10E6/uL (ref 3.77–5.28)
RDW: 14.6 % (ref 11.7–15.4)
WBC: 6.4 10*3/uL (ref 3.4–10.8)

## 2020-08-20 LAB — QUANTIFERON-TB GOLD PLUS
QuantiFERON Mitogen Value: >10 IU/mL
QuantiFERON Nil Value: 0.07 IU/mL
QuantiFERON TB1 Ag Value: 0.06 IU/mL
QuantiFERON TB2 Ag Value: 0.06 IU/mL
QuantiFERON-TB Gold Plus: NEGATIVE

## 2020-08-20 NOTE — Progress Notes (Signed)
  HPI: Pt presents for follow up infertility. Recent labs and [place].  Ultrasound demonstrates one small IM fibroid 1.9 cm, normal otherwise Labs: low AMH and testosterone, normal LH, FSH.  PMHx: She  has a past medical history of Alcohol abuse, Arthritis, Cervical radiculopathy, Diabetes mellitus without complication (HCC) (2000), Headache, Herpes, Hyperlipidemia, Hypertension, Morbid obesity (HCC), OSA (obstructive sleep apnea), Psoriasis, and Tobacco abuse. Also,  has a past surgical history that includes No past surgery.., family history includes Alcohol abuse in her maternal grandfather and maternal grandmother; Aneurysm in her maternal grandfather; Breast cancer in an other family member; Cancer in her maternal grandmother; Colon cancer (age of onset: 59) in an other family member; Diabetes in her maternal grandmother and mother; Hypertension in her maternal grandmother and mother; Other in her father.,  reports that she has quit smoking. Her smoking use included cigarettes. She smoked 0.50 packs per day. She has never used smokeless tobacco. She reports current alcohol use. She reports that she does not use drugs.  She has a current medication list which includes the following prescription(s): acetaminophen, albuterol, amlodipine, aspirin, cholecalciferol, cosentyx sensoready (300 mg), cyclobenzaprine, fluocinolone acetonide body, ketoconazole, lisinopril, metformin, metoprolol succinate, pravastatin, spironolactone, sumatriptan, valacyclovir, and venlafaxine xr. Also, has No Known Allergies.  Review of Systems  All other systems reviewed and are negative.   Objective: BP (!) 170/100 (Cuff Size: Large)   Pulse 86   Ht 5\' 4"  (1.626 m)   Wt 269 lb (122 kg)   LMP 07/24/2020 (Approximate)   BMI 46.17 kg/m   Physical examination Constitutional NAD, Conversant  Skin No rashes, lesions or ulceration.   Extremities: Moves all appropriately.  Normal ROM for age. No lymphadenopathy.  Neuro:  Grossly intact  Psych: Oriented to PPT.  Normal mood. Normal affect.   Assessment:  Infertility, female Discussed referral for fertility tx options    IVF, Clomid challenge for ovarian reserve, donor egg, adoption all discussed today    Age a concern, she is aware but hopeful for biologic pregnancy  HTN, needs continued control measures w PCP.  May be stress related today.   A total of 20 minutes were spent face-to-face with the patient as well as preparation, review, communication, and documentation during this encounter.   07/26/2020, MD, Annamarie Major [facility]/G[facility], [facility] 08/20/2020  4:29 PM

## 2020-08-24 ENCOUNTER — Other Ambulatory Visit: Payer: Self-pay

## 2020-08-24 ENCOUNTER — Telehealth: Payer: Self-pay

## 2020-08-24 MED ORDER — TALTZ 80 MG/ML ~~LOC~~ SOAJ: 80.0000 mg | SUBCUTANEOUS | 2 refills | Status: DC

## 2020-08-24 MED ORDER — TALTZ 80 MG/ML ~~LOC~~ SOAJ: 80.0000 mg | SUBCUTANEOUS | 1 refills | Status: DC

## 2020-08-24 MED ORDER — TALTZ 80 MG/ML ~~LOC~~ SOAJ: 160.0000 mg | SUBCUTANEOUS | 0 refills | Status: DC

## 2020-08-24 MED ORDER — TALTZ 80 MG/ML ~~LOC~~ SOAJ: 80.0000 mg | Freq: Once | SUBCUTANEOUS | 0 refills | Status: AC

## 2020-08-24 NOTE — Telephone Encounter (Signed)
Advised patient labs ok. She has mild anemia and will discuss with PCP. Will send in Taltz Initial, Induction, and Maintenance Rxs to [place].

## 2020-08-24 NOTE — Addendum Note (Signed)
Addended by: Willeen Niece on: 08/24/2020 08:28 AM   Modules accepted: Level of Service

## 2020-08-24 NOTE — Telephone Encounter (Signed)
-----   Message from Willeen Niece, MD sent at 08/24/2020  8:36 AM EDT ----- Labs ok.  Pt has mild anemia which seems to be stable over past year.  PCP should address.  TB is negative.  Ok to send in [place] for [employer].

## 2020-09-01 ENCOUNTER — Ambulatory Visit: Payer: Managed Care, Other (non HMO) | Admitting: Gastroenterology

## 2020-09-07 ENCOUNTER — Ambulatory Visit
Admission: RE | Admit: 2020-09-07 | Discharge: 2020-09-07 | Disposition: A | Discharge status: Home / Self Care | Payer: Managed Care, Other (non HMO) | Source: Ambulatory Visit | Attending: Emergency Medicine | Admitting: Emergency Medicine

## 2020-09-07 ENCOUNTER — Other Ambulatory Visit: Payer: Self-pay

## 2020-09-07 ENCOUNTER — Telehealth: Payer: Self-pay

## 2020-09-07 VITALS — BP 175/105 | HR 76 | Temp 99.0°F | Resp 18

## 2020-09-07 DIAGNOSIS — N898 Other specified noninflammatory disorders of vagina: Secondary | ICD-10-CM | POA: Insufficient documentation

## 2020-09-07 DIAGNOSIS — I10 Essential (primary) hypertension: Secondary | ICD-10-CM | POA: Insufficient documentation

## 2020-09-07 LAB — POCT URINALYSIS DIP (MANUAL ENTRY)
Bilirubin, UA: NEGATIVE
Glucose, UA: NEGATIVE mg/dL
Ketones, POC UA: NEGATIVE mg/dL
Nitrite, UA: NEGATIVE
Protein Ur, POC: NEGATIVE mg/dL
Spec Grav, UA: 1.025 (ref 1.010–1.025)
Urobilinogen, UA: 0.2 E.U./dL
pH, UA: 6.5 (ref 5.0–8.0)

## 2020-09-07 LAB — POCT URINE PREGNANCY: Preg Test, Ur: NEGATIVE

## 2020-09-07 MED ORDER — METRONIDAZOLE 500 MG PO TABS: 500.0000 mg | ORAL_TABLET | Freq: Two times a day (BID) | ORAL | 0 refills | Status: DC

## 2020-09-07 NOTE — ED Provider Notes (Signed)
[facility]    CSN: [id number] Arrival date & time: 09/07/20  [id number]      History   Chief Complaint Chief Complaint  Patient presents with  . SEXUALLY TRANSMITTED DISEASE  . Vaginal Itching  . Abdominal Pain    HPI Nicole MareLatisha Y Mendez is a 45 y.o. female.   Patient presents with vaginal irritation and suprapubic abdominal discomfort x4 days.  She denies fever, chills, dysuria, hematuria, vaginal discharge, pelvic pain, rash, lesions, or other symptoms.  She request testing for STDs.  She states her symptoms are similar to previous episodes of BV.  Her medical history includes HSV, diabetes, hypertension, morbid obesity, psoriasis, tobacco abuse, alcohol abuse, chronic headaches, anxiety.  The history is provided by the patient and medical records.    Past Medical History:  Diagnosis Date  . Alcohol abuse   . Arthritis   . Cervical radiculopathy   . Diabetes mellitus without complication (HCC) 2000  . Headache   . Herpes    1 and 2 since 45 y.o   . Hyperlipidemia   . Hypertension   . Morbid obesity (HCC)   . OSA (obstructive sleep apnea)    Not on CPAP  . Psoriasis   . Tobacco abuse     Patient Active Problem List   Diagnosis Date Noted  . Iron deficiency anemia 08/19/2020  . Vitamin D deficiency 08/19/2020  . Chronic migraine w/o aura w/o status migrainosus, not intractable 05/14/2020  . Arthritis of left knee 03/17/2020  . Hypertension associated with diabetes (HCC) 10/03/2019  . Trigger finger of left thumb 05/31/2019  . Cellulitis of left breast 05/31/2019  . GAD (generalized anxiety disorder) 02/21/2019  . Prediabetes 02/21/2019  . COVID-19 01/16/2019  . Plantar fasciitis of right foot 01/30/2018  . GERD (gastroesophageal reflux disease) 08/24/2017  . Acute pain of left shoulder 03/20/2017  . Cervical radiculopathy 03/20/2017  . Anemia 08/22/2016  . Annual physical exam 02/02/2016  . Chronic headache 08/20/2015  . Alcohol abuse   . Cervical  radiculopathy at C6 07/19/2015  . Trapezius strain 04/21/2015  . Essential hypertension 08/20/2014  . OSA on CPAP 08/20/2014  . Type 2 diabetes mellitus (HCC) 08/20/2014  . Hyperlipidemia 01/10/2008  . Morbid obesity with BMI of 45.0-49.9, adult (HCC) 01/10/2008  . Bacterial vaginosis 12/28/2007  . PSORIASIS 12/17/2007  . ALLERGIC RHINITIS 09/24/2007  . HSV (herpes simplex virus) infection 07/03/2004    Past Surgical History:  Procedure Laterality Date  . No past surgery.      OB History    Gravida  0   Para  0   Term  0   Preterm  0   AB  0   Living  0     SAB  0   IAB  0   Ectopic  0   Multiple  0   Live Births               Home Medications    Prior to Admission medications   Medication Sig Start Date End Date Taking? Authorizing Provider  amLODipine (NORVASC) 5 MG tablet Take 1 tablet (5 mg total) by mouth daily. 01/14/20  Yes McLean-Scocuzza, Pasty Spillersracy N, MD  aspirin 81 MG tablet Take 81 mg by mouth daily.   Yes [provider]  Cholecalciferol 1.25 MG (50000 UT) capsule Take 1 capsule (50,000 Units total) by mouth once a week. 08/19/20  Yes McLean-Scocuzza, Pasty Spillersracy N, MD  cyclobenzaprine (FLEXERIL) 5 MG tablet Take 1 tablet (5  mg total) by mouth daily as needed for muscle spasms. 08/24/17  Yes McLean-Scocuzza, Pasty Spillers, MD  Fluocinolone Acetonide Body 0.01 % OIL Apply 1 application topically at bedtime. Apply to aa psoriasis scalp and body qd 08/17/20 08/17/21 Yes Willeen Niece, MD  Ixekizumab (TALTZ) 80 MG/ML SOAJ Inject 160 mg into the skin as directed. Inject 160 mg (2 x 80 mg) subcutaneous at week 0, then begin first induction dose 80 mg (1 x 80 mg) 2 weeks later (week 2). 08/24/20  Yes Willeen Niece, MD  ketoconazole (NIZORAL) 2 % shampoo Apply 1 application topically once a week. Shampoo scalp weekly, let sit 5 minutes and rinse out 08/17/20  Yes Willeen Niece, MD  lisinopril (ZESTRIL) 40 MG tablet Take 1 tablet (40 mg total) by mouth daily. 03/10/20   Yes McLean-Scocuzza, Pasty Spillers, MD  metFORMIN (GLUCOPHAGE) 500 MG tablet TAKE 1 TABLET BY MOUTH  TWICE DAILY WITH FOOD 08/03/20  Yes McLean-Scocuzza, Pasty Spillers, MD  metoprolol succinate (TOPROL-XL) 25 MG 24 hr tablet Take 1 tablet (25 mg total) by mouth daily. At night 03/10/20  Yes McLean-Scocuzza, Pasty Spillers, MD  metroNIDAZOLE (FLAGYL) 500 MG tablet Take 1 tablet (500 mg total) by mouth 2 (two) times daily. 09/07/20  Yes Mickie Bail, NP  pravastatin (PRAVACHOL) 40 MG tablet Take 1 tablet (40 mg total) by mouth daily. 03/10/20  Yes McLean-Scocuzza, Pasty Spillers, MD  spironolactone (ALDACTONE) 50 MG tablet Take 1 tablet (50 mg total) by mouth daily. In am 10/03/19  Yes McLean-Scocuzza, Pasty Spillers, MD  valACYclovir (VALTREX) 500 MG tablet Take 1 tablet (500 mg total) by mouth daily. Bid x 5 days prn 03/10/20  Yes McLean-Scocuzza, Pasty Spillers, MD  acetaminophen (TYLENOL) 650 MG CR tablet Take 650 mg by mouth every 8 (eight) hours as needed for pain.     [provider]  albuterol (VENTOLIN HFA) 108 (90 Base) MCG/ACT inhaler Inhale 1-2 puffs into the lungs every 4 (four) hours as needed for wheezing or shortness of breath. 01/29/19   McLean-Scocuzza, Pasty Spillers, MD  COSENTYX SENSOREADY, 300 MG, 150 MG/ML SOAJ  01/16/19   [provider]  Ixekizumab (TALTZ) 80 MG/ML SOAJ Inject 80 mg into the skin every 14 (fourteen) days. Weeks 4-10. 08/24/20   Willeen Niece, MD  Ixekizumab (TALTZ) 80 MG/ML SOAJ Inject 80 mg into the skin every 28 (twenty-eight) days. For maintenance. 08/24/20   Willeen Niece, MD  SUMAtriptan (IMITREX) 50 MG tablet Take 1 tablet (50 mg total) by mouth every 2 (two) hours as needed. May repeat in 2 hours if headache persists or recurs. 05/14/20   Levert Feinstein, MD  venlafaxine XR (EFFEXOR XR) 75 MG 24 hr capsule Take 1 capsule (75 mg total) by mouth daily with breakfast. 05/14/20   Levert Feinstein, MD    Family History Family History  Problem Relation Age of Onset  . Alcohol abuse Maternal Grandmother   .  Cancer Maternal Grandmother        Ovarian  . Hypertension Maternal Grandmother   . Diabetes Maternal Grandmother   . Hypertension Mother   . Diabetes Mother   . Colon cancer Other 80  . Breast cancer Other        + FH  . Aneurysm Maternal Grandfather   . Alcohol abuse Maternal Grandfather   . Other Father        unsure of medical history    Social History Social History   Tobacco Use  . Smoking status: Former Smoker  Packs/day: 0.50    Types: Cigarettes  . Smokeless tobacco: Never Used  . Tobacco comment: stopped 11/2015  Vaping Use  . Vaping Use: Never used  Substance Use Topics  . Alcohol use: Yes    Comment: occasionally  . Drug use: No     Allergies   Patient has no known allergies.   Review of Systems Review of Systems  Constitutional: Negative for chills and fever.  Respiratory: Negative for cough and shortness of breath.   Cardiovascular: Negative for chest pain and palpitations.  Gastrointestinal: Positive for abdominal pain. Negative for vomiting.  Genitourinary: Negative for dysuria, flank pain, hematuria, pelvic pain and vaginal discharge.       Vaginal irritation  Skin: Negative for color change and rash.  All other systems reviewed and are negative.    Physical Exam Triage Vital Signs ED Triage Vitals  Enc Vitals Group     BP      Pulse      Resp      Temp      Temp src      SpO2      Weight      Height      Head Circumference      Peak Flow      Pain Score      Pain Loc      Pain Edu?      Excl. in GC?    No data found.  Updated Vital Signs BP (!) 175/105 (BP Location: Left Arm)   Pulse 76   Temp 99 F (37.2 C) (Oral)   Resp 18   LMP  (LMP Unknown)   SpO2 96%   Visual Acuity Right Eye Distance:   Left Eye Distance:   Bilateral Distance:    Right Eye Near:   Left Eye Near:    Bilateral Near:     Physical Exam Vitals and nursing note reviewed.  Constitutional:      General: She is not in acute distress.     Appearance: She is well-developed. She is obese. She is not ill-appearing.  HENT:     Head: Normocephalic and atraumatic.     Mouth/Throat:     Mouth: Mucous membranes are moist.  Eyes:     Conjunctiva/sclera: Conjunctivae normal.  Cardiovascular:     Rate and Rhythm: Normal rate and regular rhythm.     Heart sounds: Normal heart sounds.  Pulmonary:     Effort: Pulmonary effort is normal. No respiratory distress.     Breath sounds: Normal breath sounds.  Abdominal:     Palpations: Abdomen is soft.     Tenderness: There is no abdominal tenderness. There is no right CVA tenderness, guarding or rebound.  Musculoskeletal:     Cervical back: Neck supple.  Skin:    General: Skin is warm and dry.  Neurological:     General: No focal deficit present.     Mental Status: She is alert and oriented to person, place, and time.     Gait: Gait normal.  Psychiatric:        Mood and Affect: Mood normal.        Behavior: Behavior normal.      UC Treatments / Results  Labs (all labs ordered are listed, but only abnormal results are displayed) Labs Reviewed  POCT URINALYSIS DIP (MANUAL ENTRY) - Abnormal; Notable for the following components:      Result Value   Blood, UA moderate (*)    Leukocytes,  UA Small (1+) (*)    All other components within normal limits  URINE CULTURE  POCT URINE PREGNANCY  CERVICOVAGINAL ANCILLARY ONLY    EKG   Radiology No results found.  Procedures Procedures (including critical care time)  Medications Ordered in UC Medications - No data to display  Initial Impression / Assessment and Plan / UC Course  I have reviewed the triage vital signs and the nursing notes.  Pertinent labs & imaging results that were available during my care of the patient were reviewed by me and considered in my medical decision making (see chart for details).   Vaginal irritation, Elevated blood pressure with known HTN.  Patient obtained vaginal self swab for testing.   Treating with metronidazole.  Education provided on bacterial vaginitis.  Discussed with patient that she may require additional treatment if her test results are positive.  Instructed her to abstain from sexual activity for at least 7 days.  Instructed her to follow-up with her PCP or OB/GYN if her symptoms are not improving.  Also discussed that her blood pressure is elevated today and needs to be rechecked by her PCP in 2 to 4 weeks.  Education provided on managing hypertension.  Patient agrees to plan of care.   Final Clinical Impressions(s) / UC Diagnoses   Final diagnoses:  Vaginal irritation  Elevated blood pressure reading in office with diagnosis of hypertension     Discharge Instructions     Take metronidazole twice a day for 7 days.    Your vaginal tests are pending.  If your test results are positive, we will call you.  You and your sexual partner(s) may require treatment at that time.  Do not have sexual activity for at least 7 days.    Your blood pressure is elevated today at 175/105.  Please have this rechecked by your primary care provider in 2-4 weeks.         ED Prescriptions    Medication Sig Dispense Auth. Provider   metroNIDAZOLE (FLAGYL) 500 MG tablet Take 1 tablet (500 mg total) by mouth 2 (two) times daily. 14 tablet Mickie Bail, NP     PDMP not reviewed this encounter.   Mickie Bail, NP 09/07/20 [id number]

## 2020-09-07 NOTE — Telephone Encounter (Signed)
Taltz denied by insurance stating that pt must try and fail one of the following -   Cimzia, Humira, Skyrizi, [place], or [address]

## 2020-09-07 NOTE — Discharge Instructions (Addendum)
Take metronidazole twice a day for 7 days.    Your vaginal tests are pending.  If your test results are positive, we will call you.  You and your sexual partner(s) may require treatment at that time.  Do not have sexual activity for at least 7 days.    Your blood pressure is elevated today at 175/105.  Please have this rechecked by your primary care provider in 2-4 weeks.

## 2020-09-07 NOTE — Telephone Encounter (Signed)
Please send in [place] and let pt know of the change

## 2020-09-07 NOTE — ED Triage Notes (Signed)
Pt presents today with c/o of lower abd pain, vaginal irritation and itching x 4 days. Denies discharge. She requests to be tested for STDs.

## 2020-09-08 ENCOUNTER — Other Ambulatory Visit: Payer: Self-pay

## 2020-09-08 DIAGNOSIS — L409 Psoriasis, unspecified: Secondary | ICD-10-CM

## 2020-09-08 LAB — CERVICOVAGINAL ANCILLARY ONLY
Bacterial Vaginitis (gardnerella): POSITIVE — AB
Candida Glabrata: NEGATIVE
Candida Vaginitis: NEGATIVE
Chlamydia: NEGATIVE
Comment: NEGATIVE
Comment: NEGATIVE
Comment: NEGATIVE
Comment: NEGATIVE
Comment: NEGATIVE
Comment: NORMAL
Neisseria Gonorrhea: NEGATIVE
Trichomonas: NEGATIVE

## 2020-09-08 MED ORDER — SKYRIZI 150 MG/ML ~~LOC~~ SOSY: 150.0000 mg | PREFILLED_SYRINGE | SUBCUTANEOUS | 1 refills | Status: DC

## 2020-09-08 NOTE — Telephone Encounter (Signed)
Initial and maintenance dose sent to [facility]. Pt notified.

## 2020-09-09 ENCOUNTER — Telehealth (HOSPITAL_COMMUNITY): Payer: Self-pay | Admitting: Emergency Medicine

## 2020-09-09 LAB — URINE CULTURE: Culture: >=100000 — AB

## 2020-09-09 MED ORDER — NITROFURANTOIN MONOHYD MACRO 100 MG PO CAPS: 100.0000 mg | ORAL_CAPSULE | Freq: Two times a day (BID) | ORAL | 0 refills | Status: DC

## 2020-09-14 ENCOUNTER — Other Ambulatory Visit: Payer: Self-pay

## 2020-09-14 DIAGNOSIS — L409 Psoriasis, unspecified: Secondary | ICD-10-CM

## 2020-09-14 MED ORDER — SKYRIZI 150 MG/ML ~~LOC~~ SOSY: 150.0000 mg | PREFILLED_SYRINGE | SUBCUTANEOUS | 1 refills | Status: DC

## 2020-09-14 NOTE — Progress Notes (Signed)
Per Michelene Heady, rx must be filled with [facility] specialty pharmacy.

## 2020-09-22 ENCOUNTER — Ambulatory Visit: Payer: Managed Care, Other (non HMO)

## 2020-09-23 ENCOUNTER — Inpatient Hospital Stay
Admission: RE | Admit: 2020-09-23 | Discharge: 2020-09-23 | Disposition: A | Discharge status: Home / Self Care | Payer: Managed Care, Other (non HMO) | Source: Ambulatory Visit | Attending: Internal Medicine | Admitting: Internal Medicine

## 2020-09-24 ENCOUNTER — Ambulatory Visit
Admission: RE | Admit: 2020-09-24 | Discharge: 2020-09-24 | Disposition: A | Discharge status: Home / Self Care | Payer: Managed Care, Other (non HMO) | Source: Ambulatory Visit | Attending: Emergency Medicine | Admitting: Emergency Medicine

## 2020-09-24 ENCOUNTER — Other Ambulatory Visit: Payer: Self-pay

## 2020-09-24 VITALS — BP 153/94 | HR 88 | Temp 98.6°F | Resp 18

## 2020-09-24 DIAGNOSIS — I10 Essential (primary) hypertension: Secondary | ICD-10-CM | POA: Insufficient documentation

## 2020-09-24 DIAGNOSIS — Z79899 Other long term (current) drug therapy: Secondary | ICD-10-CM | POA: Insufficient documentation

## 2020-09-24 DIAGNOSIS — Z87891 Personal history of nicotine dependence: Secondary | ICD-10-CM | POA: Insufficient documentation

## 2020-09-24 DIAGNOSIS — Z113 Encounter for screening for infections with a predominantly sexual mode of transmission: Secondary | ICD-10-CM | POA: Diagnosis present

## 2020-09-24 DIAGNOSIS — E119 Type 2 diabetes mellitus without complications: Secondary | ICD-10-CM | POA: Diagnosis not present

## 2020-09-24 DIAGNOSIS — Z7984 Long term (current) use of oral hypoglycemic drugs: Secondary | ICD-10-CM | POA: Insufficient documentation

## 2020-09-24 DIAGNOSIS — Z7982 Long term (current) use of aspirin: Secondary | ICD-10-CM | POA: Diagnosis not present

## 2020-09-24 LAB — POCT URINALYSIS DIP (MANUAL ENTRY)
Bilirubin, UA: NEGATIVE
Blood, UA: NEGATIVE
Glucose, UA: NEGATIVE mg/dL
Ketones, POC UA: NEGATIVE mg/dL
Leukocytes, UA: NEGATIVE
Nitrite, UA: NEGATIVE
Protein Ur, POC: 30 mg/dL — AB
Spec Grav, UA: 1.02 (ref 1.010–1.025)
Urobilinogen, UA: 0.2 E.U./dL
pH, UA: 7 (ref 5.0–8.0)

## 2020-09-24 LAB — POCT URINE PREGNANCY: Preg Test, Ur: NEGATIVE

## 2020-09-24 NOTE — ED Provider Notes (Signed)
[facility]    CSN: [id number] Arrival date & time: 09/24/20  1502      History   Chief Complaint Chief Complaint  Patient presents with   SEXUALLY TRANSMITTED DISEASE    HPI Nicole Mendez is a 45 y.o. female.  Patient presents with request for STD testing, including HIV and syphilis.  She denies symptoms; no vaginal discharge, pelvic pain, abdominal pain, dysuria, flank pain, rash, lesions.  She was seen here on 09/07/2020; diagnosed with vaginal irritation and elevated blood pressure reading with known hypertension; treated with metronidazole; her cytology was positive for BV.  Her medical history includes hypertension, diabetes, morbid obesity, alcohol abuse, tobacco abuse, chronic headache, cervical radiculopathy, GERD, anxiety, anemia.  The history is provided by the patient and medical records.   Past Medical History:  Diagnosis Date   Alcohol abuse    Arthritis    Cervical radiculopathy    Diabetes mellitus without complication (HCC) 2000   Headache    Herpes    1 and 2 since 45 y.o    Hyperlipidemia    Hypertension    Morbid obesity (HCC)    OSA (obstructive sleep apnea)    Not on CPAP   Psoriasis    Tobacco abuse     Patient Active Problem List   Diagnosis Date Noted   Iron deficiency anemia 08/19/2020   Vitamin D deficiency 08/19/2020   Chronic migraine w/o aura w/o status migrainosus, not intractable 05/14/2020   Arthritis of left knee 03/17/2020   Hypertension associated with diabetes (HCC) 10/03/2019   Trigger finger of left thumb 05/31/2019   Cellulitis of left breast 05/31/2019   GAD (generalized anxiety disorder) 02/21/2019   Prediabetes 02/21/2019   COVID-19 01/16/2019   Plantar fasciitis of right foot 01/30/2018   GERD (gastroesophageal reflux disease) 08/24/2017   Acute pain of left shoulder 03/20/2017   Cervical radiculopathy 03/20/2017   Anemia 08/22/2016   Annual physical exam 02/02/2016   Chronic headache 08/20/2015    Alcohol abuse    Cervical radiculopathy at C6 07/19/2015   Trapezius strain 04/21/2015   Essential hypertension 08/20/2014   OSA on CPAP 08/20/2014   Type 2 diabetes mellitus (HCC) 08/20/2014   Hyperlipidemia 01/10/2008   Morbid obesity with BMI of 45.0-49.9, adult (HCC) 01/10/2008   Bacterial vaginosis 12/28/2007   PSORIASIS 12/17/2007   ALLERGIC RHINITIS 09/24/2007   HSV (herpes simplex virus) infection 07/03/2004    Past Surgical History:  Procedure Laterality Date   No past surgery.      OB History     Gravida  0   Para  0   Term  0   Preterm  0   AB  0   Living  0      SAB  0   IAB  0   Ectopic  0   Multiple  0   Live Births               Home Medications    Prior to Admission medications   Medication Sig Start Date End Date Taking? Authorizing Provider  acetaminophen (TYLENOL) 650 MG CR tablet Take 650 mg by mouth every 8 (eight) hours as needed for pain.     [provider]  albuterol (VENTOLIN HFA) 108 (90 Base) MCG/ACT inhaler Inhale 1-2 puffs into the lungs every 4 (four) hours as needed for wheezing or shortness of breath. 01/29/19   McLean-Scocuzza, Pasty Spillersracy N, MD  amLODipine (NORVASC) 5 MG tablet Take 1 tablet (  5 mg total) by mouth daily. 01/14/20   McLean-Scocuzza, Pasty Spillers, MD  aspirin 81 MG tablet Take 81 mg by mouth daily.    [provider]  Cholecalciferol 1.25 MG (50000 UT) capsule Take 1 capsule (50,000 Units total) by mouth once a week. 08/19/20   McLean-Scocuzza, Pasty Spillers, MD  cyclobenzaprine (FLEXERIL) 5 MG tablet Take 1 tablet (5 mg total) by mouth daily as needed for muscle spasms. 08/24/17   McLean-Scocuzza, Pasty Spillers, MD  Fluocinolone Acetonide Body 0.01 % OIL Apply 1 application topically at bedtime. Apply to aa psoriasis scalp and body qd 08/17/20 08/17/21  Willeen Niece, MD  ketoconazole (NIZORAL) 2 % shampoo Apply 1 application topically once a week. Shampoo scalp weekly, let sit 5 minutes and rinse out 08/17/20    Willeen Niece, MD  lisinopril (ZESTRIL) 40 MG tablet Take 1 tablet (40 mg total) by mouth daily. 03/10/20   McLean-Scocuzza, Pasty Spillers, MD  metFORMIN (GLUCOPHAGE) 500 MG tablet TAKE 1 TABLET BY MOUTH  TWICE DAILY WITH FOOD 08/03/20   McLean-Scocuzza, Pasty Spillers, MD  metoprolol succinate (TOPROL-XL) 25 MG 24 hr tablet Take 1 tablet (25 mg total) by mouth daily. At night 03/10/20   McLean-Scocuzza, Pasty Spillers, MD  metroNIDAZOLE (FLAGYL) 500 MG tablet Take 1 tablet (500 mg total) by mouth 2 (two) times daily. 09/07/20   Mickie Bail, NP  nitrofurantoin, macrocrystal-monohydrate, (MACROBID) 100 MG capsule Take 1 capsule (100 mg total) by mouth 2 (two) times daily. 09/09/20   Merrilee Jansky, MD  pravastatin (PRAVACHOL) 40 MG tablet Take 1 tablet (40 mg total) by mouth daily. 03/10/20   McLean-Scocuzza, Pasty Spillers, MD  Risankizumab-rzaa [facility]) 150 MG/ML SOSY Inject 150 mg into the skin as directed. At weeks 0 & 4. 09/14/20   Willeen Niece, MD  Risankizumab-rzaa [facility]) 150 MG/ML SOSY Inject 150 mg into the skin as directed. Every 12 weeks for maintenance. 09/14/20   Willeen Niece, MD  spironolactone (ALDACTONE) 50 MG tablet Take 1 tablet (50 mg total) by mouth daily. In am 10/03/19   McLean-Scocuzza, Pasty Spillers, MD  SUMAtriptan (IMITREX) 50 MG tablet Take 1 tablet (50 mg total) by mouth every 2 (two) hours as needed. May repeat in 2 hours if headache persists or recurs. 05/14/20   Levert Feinstein, MD  valACYclovir (VALTREX) 500 MG tablet Take 1 tablet (500 mg total) by mouth daily. Bid x 5 days prn 03/10/20   McLean-Scocuzza, Pasty Spillers, MD  venlafaxine XR (EFFEXOR XR) 75 MG 24 hr capsule Take 1 capsule (75 mg total) by mouth daily with breakfast. 05/14/20   Levert Feinstein, MD    Family History Family History  Problem Relation Age of Onset   Alcohol abuse Maternal Grandmother    Cancer Maternal Grandmother        Ovarian   Hypertension Maternal Grandmother    Diabetes Maternal Grandmother    Hypertension Mother    Diabetes Mother     Colon cancer Other 24   Breast cancer Other        + FH   Aneurysm Maternal Grandfather    Alcohol abuse Maternal Grandfather    Other Father        unsure of medical history    Social History Social History   Tobacco Use   Smoking status: Former    Packs/day: 0.50    Pack years: 0.00    Types: Cigarettes   Smokeless tobacco: Never   Tobacco comments:    stopped 11/2015  Vaping  Use   Vaping Use: Never used  Substance Use Topics   Alcohol use: Yes    Comment: occasionally   Drug use: No     Allergies   Patient has no known allergies.   Review of Systems Review of Systems  Constitutional:  Negative for chills and fever.  Respiratory:  Negative for cough and shortness of breath.   Cardiovascular:  Negative for chest pain and palpitations.  Gastrointestinal:  Negative for abdominal pain and vomiting.  Genitourinary:  Negative for dysuria, flank pain, hematuria, pelvic pain and vaginal discharge.  Skin:  Negative for color change and rash.  All other systems reviewed and are negative.   Physical Exam Triage Vital Signs ED Triage Vitals [09/24/20 1522]  Enc Vitals Group     BP      Pulse      Resp      Temp      Temp src      SpO2      Weight      Height      Head Circumference      Peak Flow      Pain Score 0     Pain Loc      Pain Edu?      Excl. in GC?    No data found.  Updated Vital Signs BP (!) 153/94 (BP Location: Left Arm)   Pulse 88   Temp 98.6 F (37 C) (Oral)   Resp 18   LMP  (LMP Unknown) Comment: can't remember LMP  SpO2 97%   Visual Acuity Right Eye Distance:   Left Eye Distance:   Bilateral Distance:    Right Eye Near:   Left Eye Near:    Bilateral Near:     Physical Exam Vitals and nursing note reviewed.  Constitutional:      General: She is not in acute distress.    Appearance: She is well-developed. She is obese. She is not ill-appearing.  HENT:     Head: Normocephalic and atraumatic.     Mouth/Throat:     Mouth:  Mucous membranes are moist.  Eyes:     Conjunctiva/sclera: Conjunctivae normal.  Cardiovascular:     Rate and Rhythm: Normal rate and regular rhythm.     Heart sounds: Normal heart sounds.  Pulmonary:     Effort: Pulmonary effort is normal. No respiratory distress.     Breath sounds: Normal breath sounds.  Abdominal:     Palpations: Abdomen is soft.     Tenderness: There is no abdominal tenderness.  Musculoskeletal:     Cervical back: Neck supple.  Skin:    General: Skin is warm and dry.  Neurological:     General: No focal deficit present.     Mental Status: She is alert and oriented to person, place, and time.     Gait: Gait normal.  Psychiatric:        Mood and Affect: Mood normal.        Behavior: Behavior normal.     UC Treatments / Results  Labs (all labs ordered are listed, but only abnormal results are displayed) Labs Reviewed  POCT URINALYSIS DIP (MANUAL ENTRY) - Abnormal; Notable for the following components:      Result Value   Clarity, UA cloudy (*)    Protein Ur, POC =30 (*)    All other components within normal limits  RPR  HIV ANTIBODY (ROUTINE TESTING W REFLEX)  POCT URINE PREGNANCY  CERVICOVAGINAL ANCILLARY ONLY  EKG   Radiology No results found.  Procedures Procedures (including critical care time)  Medications Ordered in UC Medications - No data to display  Initial Impression / Assessment and Plan / UC Course  I have reviewed the triage vital signs and the nursing notes.  Pertinent labs & imaging results that were available during my care of the patient were reviewed by me and considered in my medical decision making (see chart for details).  STD screening.  Elevated blood pressure reading with known hypertension.  Syphilis and HIV pending.  Patient obtained vaginal swab for STD testing.  Urine does not show signs of infection.  Urine pregnancy negative.  Discussed with patient that we will call her if her test results are positive  requiring treatment.  Also discussed that her blood pressure is elevated again today and needs to be rechecked by her PCP in 1 to 2 weeks.  Education provided on managing hypertension.  Patient agrees to plan of care.   Final Clinical Impressions(s) / UC Diagnoses   Final diagnoses:  Screening for STD (sexually transmitted disease)  Elevated blood pressure reading in office with diagnosis of hypertension     Discharge Instructions      Your blood and vaginal tests are pending.  If your test results are positive, we will call you.    Your blood pressure is elevated today at 166/101.  Please have this rechecked by your primary care provider in 1-2 weeks.             ED Prescriptions   None    PDMP not reviewed this encounter.   Mickie Bail, NP 09/24/20 [phone]

## 2020-09-24 NOTE — ED Triage Notes (Signed)
Pt presents today requesting to have STD testing done, to include Syphilis and HIV. Denies symptoms.

## 2020-09-24 NOTE — Discharge Instructions (Addendum)
Your blood and vaginal tests are pending.  If your test results are positive, we will call you.    Your blood pressure is elevated today at 166/101.  Please have this rechecked by your primary care provider in 1-2 weeks.

## 2020-09-28 LAB — CERVICOVAGINAL ANCILLARY ONLY
Bacterial Vaginitis (gardnerella): POSITIVE — AB
Candida Glabrata: NEGATIVE
Candida Vaginitis: NEGATIVE
Chlamydia: NEGATIVE
Comment: NEGATIVE
Comment: NEGATIVE
Comment: NEGATIVE
Comment: NEGATIVE
Comment: NEGATIVE
Comment: NORMAL
Neisseria Gonorrhea: NEGATIVE
Trichomonas: NEGATIVE

## 2020-09-29 ENCOUNTER — Encounter: Payer: Self-pay | Admitting: Internal Medicine

## 2020-09-29 ENCOUNTER — Ambulatory Visit
Admission: EM | Admit: 2020-09-29 | Discharge: 2020-09-29 | Disposition: A | Discharge status: Home / Self Care | Payer: Managed Care, Other (non HMO) | Attending: Emergency Medicine | Admitting: Emergency Medicine

## 2020-09-29 ENCOUNTER — Telehealth (HOSPITAL_COMMUNITY): Payer: Self-pay | Admitting: Emergency Medicine

## 2020-09-29 ENCOUNTER — Other Ambulatory Visit: Payer: Self-pay

## 2020-09-29 DIAGNOSIS — A539 Syphilis, unspecified: Secondary | ICD-10-CM

## 2020-09-29 LAB — HIV ANTIBODY (ROUTINE TESTING W REFLEX): HIV Screen 4th Generation wRfx: NONREACTIVE

## 2020-09-29 LAB — RPR: RPR Ser Ql: REACTIVE — AB

## 2020-09-29 LAB — RPR, QUANT+TP ABS (REFLEX)
Rapid Plasma Reagin, Quant: 1:2 {titer} — ABNORMAL HIGH
T Pallidum Abs: REACTIVE — AB

## 2020-09-29 MED ORDER — PENICILLIN G BENZATHINE 1200000 UNIT/2ML IM SUSY
2.4000 10*6.[IU] | PREFILLED_SYRINGE | Freq: Once | INTRAMUSCULAR | Status: AC
  Administered 2020-09-29: 2.4 10*6.[IU] via INTRAMUSCULAR

## 2020-09-29 NOTE — ED Triage Notes (Signed)
Pt presents today to receive treatment for Syphilis as directed.

## 2020-10-02 MED ORDER — METRONIDAZOLE 500 MG PO TABS
500.0000 mg | ORAL_TABLET | Freq: Two times a day (BID) | ORAL | 0 refills | Status: DC
Start: 2020-10-02 — End: 2021-01-04

## 2020-10-09 ENCOUNTER — Ambulatory Visit: Payer: Managed Care, Other (non HMO) | Admitting: Internal Medicine

## 2020-10-27 ENCOUNTER — Encounter: Payer: Self-pay | Admitting: Internal Medicine

## 2020-10-27 NOTE — Telephone Encounter (Signed)
Okay to have patient come in for TB test nurse visit?

## 2020-10-28 NOTE — Addendum Note (Signed)
Addended by: Quentin Ore on: 10/28/2020 08:09 AM   Modules accepted: Orders

## 2020-11-10 ENCOUNTER — Encounter: Payer: Self-pay | Admitting: *Deleted

## 2020-11-16 DIAGNOSIS — Z0289 Encounter for other administrative examinations: Secondary | ICD-10-CM

## 2020-11-18 ENCOUNTER — Telehealth: Payer: Self-pay | Admitting: *Deleted

## 2020-11-18 NOTE — Telephone Encounter (Signed)
Pt [facility] form faxed on 11/18/20

## 2020-11-22 LAB — QUANTIFERON-TB GOLD PLUS
QuantiFERON Mitogen Value: >10 IU/mL
QuantiFERON Nil Value: 0.01 IU/mL
QuantiFERON TB1 Ag Value: 0.07 IU/mL
QuantiFERON TB2 Ag Value: 0.01 IU/mL
QuantiFERON-TB Gold Plus: NEGATIVE

## 2020-12-01 NOTE — Progress Notes (Signed)
Left a message to call back. Mychart message has been sent to patient to call back as this is the 3rd attempt

## 2020-12-11 ENCOUNTER — Ambulatory Visit: Payer: Managed Care, Other (non HMO)

## 2020-12-18 ENCOUNTER — Ambulatory Visit: Payer: Managed Care, Other (non HMO)

## 2021-01-04 ENCOUNTER — Ambulatory Visit: Payer: Managed Care, Other (non HMO) | Admitting: Neurology

## 2021-01-04 ENCOUNTER — Other Ambulatory Visit: Payer: Self-pay

## 2021-01-04 ENCOUNTER — Encounter: Payer: Self-pay | Admitting: Neurology

## 2021-01-04 VITALS — BP 179/99 | HR 75 | Ht 64.0 in | Wt 257.5 lb

## 2021-01-04 DIAGNOSIS — G4440 Drug-induced headache, not elsewhere classified, not intractable: Secondary | ICD-10-CM

## 2021-01-04 DIAGNOSIS — G43709 Chronic migraine without aura, not intractable, without status migrainosus: Secondary | ICD-10-CM | POA: Diagnosis not present

## 2021-01-04 MED ORDER — ONDANSETRON 4 MG PO TBDP: 4.0000 mg | ORAL_TABLET | Freq: Three times a day (TID) | ORAL | 6 refills | Status: DC | PRN

## 2021-01-04 MED ORDER — RIZATRIPTAN BENZOATE 10 MG PO TBDP: 10.0000 mg | ORAL_TABLET | ORAL | 11 refills | Status: DC | PRN

## 2021-01-04 MED ORDER — VENLAFAXINE HCL ER 150 MG PO CP24: 150.0000 mg | ORAL_CAPSULE | Freq: Every day | ORAL | 3 refills | Status: DC

## 2021-01-04 NOTE — Progress Notes (Signed)
Chief Complaint  Patient presents with   Follow-up    New room - alone. She has continued venlafaxine XR, 75mg  daily. Her worst months, she may have ten migraine days. Sumatriptan does not always work well.    ASSESSMENT AND PLAN  Nicole Mendez is a 45 y.o. female    Chronic migraine Analgesic rebound headache  Also component of medicine rebound headaches, overall improved after stopped taking frequent Excedrin Migraine.  Tolerating Effexor XR 75 mg daily reported moderate improvement, no significant side effect, will titrating up the dosage to 2 tablets every day  Suboptimal response to Imitrex 50 mg as needed, will try Maxalt 10 mg as needed, may combine it with Zofran, Aleve  DIAGNOSTIC DATA (LABS, IMAGING, TESTING) - I reviewed patient records, labs, notes, testing and imaging myself where available.    HISTORICAL  Nicole Mendez is a 45 year old female, seen in request by Dr. 54 for evaluation of frequent headaches, initial evaluation was on May 14, 2020  I reviewed and summarized the referring note.  Medical history Hypertension Hyperlipidemia DM  Patient had long history of migraine headaches, used to have it two times each months, but increase the headaches since 2020, now she has migraine headache 2-3 times a week, lateralized retro-orbital area headache with light noise sensitivity, throbbing, nauseous lasting for hours, she has been taking frequent Excedrin migraine with only temporary improvement  I personally reviewed CT head without contrast May 2017 that was normal  Laboratory evaluation seen 2021, negative HIV, RPR, vitamin D 28.4, A1c 6.7, normal TSH, lipid profile LDL 73, hemoglobin 10.3,  CMP creatinine of 0.61,   UPDATE Jan 04 2021: She is taking Effexor XR 75 mg every day, which has helped her, tolerating the medication well, no significant side effect, it has helped her migraine, average 2-3 migraine each week, is no  longer taking frequent over-the-counter medication  She tried Maxalt 50 mg as needed, is helpful 50% of the time, oftentimes she has to take second dose, without complete elimination of her headache   PHYSICAL EXAM   Vitals:   01/04/21 1333  BP: (!) 179/99  Pulse: 75  Weight: 257 lb 8 oz (116.8 kg)  Height: 5\' 4"  (1.626 m)   Not recorded     Body mass index is 44.2 kg/m.  PHYSICAL EXAMNIATION:  Gen: NAD, conversant, well nourised, well groomed                 NEUROLOGICAL EXAM:  MENTAL STATUS: Speech/cognition: Awake, alert, oriented to history taking and casual conversation CRANIAL NERVES: CN II: Visual fields are full to confrontation. Pupils are round equal and briskly reactive to light. CN III, IV, VI: extraocular movement are normal. No ptosis. CN V: Facial sensation is intact to light touch CN VII: Face is symmetric with normal eye closure  CN VIII: Hearing is normal to causal conversation. CN IX, X: Phonation is normal. CN XI: Head turning and shoulder shrug are intact  MOTOR: There is no pronator drift of out-stretched arms. Muscle bulk and tone are normal. Muscle strength is normal.  REFLEXES: Reflexes are 2+ and symmetric at the biceps, triceps, knees, and ankles. Plantar responses are flexor.  SENSORY: Intact to light touch, pinprick and vibratory sensation are intact in fingers and toes.  COORDINATION: There is no trunk or limb dysmetria noted.  GAIT/STANCE: Gait is normal  REVIEW OF SYSTEMS: Full 14 system review of systems performed and notable only for as above All  other review of systems were negative.  ALLERGIES: No Known Allergies  HOME MEDICATIONS: Current Outpatient Medications  Medication Sig Dispense Refill   acetaminophen (TYLENOL) 650 MG CR tablet Take 650 mg by mouth every 8 (eight) hours as needed for pain.      albuterol (VENTOLIN HFA) 108 (90 Base) MCG/ACT inhaler Inhale 1-2 puffs into the lungs every 4 (four) hours as needed  for wheezing or shortness of breath. 18 g 2   amLODipine (NORVASC) 5 MG tablet Take 1 tablet (5 mg total) by mouth daily. 90 tablet 3   aspirin 81 MG tablet Take 81 mg by mouth daily.     Cholecalciferol 1.25 MG (50000 UT) capsule Take 1 capsule (50,000 Units total) by mouth once a week. 13 capsule 1   cyclobenzaprine (FLEXERIL) 5 MG tablet Take 1 tablet (5 mg total) by mouth daily as needed for muscle spasms. 90 tablet 0   Fluocinolone Acetonide Body 0.01 % OIL Apply 1 application topically at bedtime. Apply to aa psoriasis scalp and body qd 118 mL 4   ketoconazole (NIZORAL) 2 % shampoo Apply 1 application topically once a week. Shampoo scalp weekly, let sit 5 minutes and rinse out 120 mL 3   lisinopril (ZESTRIL) 40 MG tablet Take 1 tablet (40 mg total) by mouth daily. 90 tablet 3   metFORMIN (GLUCOPHAGE) 500 MG tablet TAKE 1 TABLET BY MOUTH  TWICE DAILY WITH FOOD 180 tablet 3   metoprolol succinate (TOPROL-XL) 25 MG 24 hr tablet Take 1 tablet (25 mg total) by mouth daily. At night 90 tablet 3   pravastatin (PRAVACHOL) 40 MG tablet Take 1 tablet (40 mg total) by mouth daily. 90 tablet 3   Risankizumab-rzaa (SKYRIZI) 150 MG/ML SOSY Inject 150 mg into the skin as directed. Every 12 weeks for maintenance. 1 mL 1   spironolactone (ALDACTONE) 50 MG tablet Take 1 tablet (50 mg total) by mouth daily. In am 90 tablet 3   SUMAtriptan (IMITREX) 50 MG tablet Take 1 tablet (50 mg total) by mouth every 2 (two) hours as needed. May repeat in 2 hours if headache persists or recurs. 12 tablet 6   valACYclovir (VALTREX) 500 MG tablet Take 1 tablet (500 mg total) by mouth daily. Bid x 5 days prn 90 tablet 3   venlafaxine XR (EFFEXOR XR) 75 MG 24 hr capsule Take 1 capsule (75 mg total) by mouth daily with breakfast. 30 capsule 11   No current facility-administered medications for this visit.    PAST MEDICAL HISTORY: Past Medical History:  Diagnosis Date   Alcohol abuse    Arthritis    Cervical radiculopathy     Diabetes mellitus without complication (HCC) 2000   Headache    Herpes    1 and 2 since 46 y.o    Hyperlipidemia    Hypertension    Morbid obesity (HCC)    OSA (obstructive sleep apnea)    Not on CPAP   Psoriasis    Tobacco abuse     PAST SURGICAL HISTORY: Past Surgical History:  Procedure Laterality Date   No past surgery.      FAMILY HISTORY: Family History  Problem Relation Age of Onset   Alcohol abuse Maternal Grandmother    Cancer Maternal Grandmother        Ovarian   Hypertension Maternal Grandmother    Diabetes Maternal Grandmother    Hypertension Mother    Diabetes Mother    Colon cancer Other 48   Breast cancer Other        +  FH   Aneurysm Maternal Grandfather    Alcohol abuse Maternal Grandfather    Other Father        unsure of medical history    SOCIAL HISTORY: Social History   Socioeconomic History   Marital status: Single    Spouse name: Not on file   Number of children: 0   Years of education: college   Highest education level: Associate degree: academic program  Occupational History   Occupation: customer service  Tobacco Use   Smoking status: Former    Packs/day: 0.50    Types: Cigarettes   Smokeless tobacco: Never   Tobacco comments:    stopped 11/2015  Vaping Use   Vaping Use: Never used  Substance and Sexual Activity   Alcohol use: Yes    Comment: occasionally   Drug use: No   Sexual activity: Yes    Partners: Male    Birth control/protection: None    Comment: 1 partner  Other Topics Concern   Not on file  Social History Narrative   Lives with her mother.   Right-handed.   No daily caffeine use.   Social Determinants of Health   Financial Resource Strain: Not on file  Food Insecurity: Not on file  Transportation Needs: Not on file  Physical Activity: Not on file  Stress: Not on file  Social Connections: Not on file  Intimate Partner Violence: Not on file      Levert Feinstein, M.D. Ph.D.  [facility] [address], [address] [place], [place] [address] Ph: [phone] Fax: [phone]  CC:  McLean-Scocuzza, Pasty Spillers, MD [address] [place],  [place] [address]

## 2021-01-04 NOTE — Patient Instructions (Signed)
Meds ordered this encounter  Medications   rizatriptan (MAXALT-MLT) 10 MG disintegrating tablet    Sig: Take 1 tablet (10 mg total) by mouth as needed for migraine. May repeat in 2 hours if needed    Dispense:  12 tablet    Refill:  11   ondansetron (ZOFRAN ODT) 4 MG disintegrating tablet    Sig: Take 1 tablet (4 mg total) by mouth every 8 (eight) hours as needed for nausea or vomiting.    Dispense:  20 tablet    Refill:  6   venlafaxine XR (EFFEXOR XR) 150 MG 24 hr capsule    Sig: Take 1 capsule (150 mg total) by mouth daily with breakfast.    Dispense:  90 capsule    Refill:  3  For prolonged severe migraine headache, you may mix Maxalt together with Zofran for nausea, over-the-counter Aleve 1 to 2 tablets, go to sleep,

## 2021-01-13 ENCOUNTER — Ambulatory Visit: Payer: Managed Care, Other (non HMO)

## 2021-01-14 ENCOUNTER — Other Ambulatory Visit: Payer: Self-pay

## 2021-01-14 ENCOUNTER — Ambulatory Visit (INDEPENDENT_AMBULATORY_CARE_PROVIDER_SITE_OTHER): Payer: Managed Care, Other (non HMO)

## 2021-01-14 DIAGNOSIS — Z23 Encounter for immunization: Secondary | ICD-10-CM

## 2021-01-15 ENCOUNTER — Encounter: Payer: Self-pay | Admitting: Internal Medicine

## 2021-01-19 NOTE — Telephone Encounter (Signed)
Immunization for placed up front for pt.

## 2021-02-04 ENCOUNTER — Ambulatory Visit (INDEPENDENT_AMBULATORY_CARE_PROVIDER_SITE_OTHER): Payer: Managed Care, Other (non HMO) | Admitting: Internal Medicine

## 2021-02-04 ENCOUNTER — Other Ambulatory Visit: Payer: Self-pay

## 2021-02-04 ENCOUNTER — Telehealth: Payer: Self-pay | Admitting: Internal Medicine

## 2021-02-04 ENCOUNTER — Encounter: Payer: Self-pay | Admitting: Internal Medicine

## 2021-02-04 VITALS — BP 134/94 | HR 80 | Temp 98.0°F | Ht 64.0 in | Wt 267.2 lb

## 2021-02-04 DIAGNOSIS — Z113 Encounter for screening for infections with a predominantly sexual mode of transmission: Secondary | ICD-10-CM

## 2021-02-04 DIAGNOSIS — Z0001 Encounter for general adult medical examination with abnormal findings: Secondary | ICD-10-CM

## 2021-02-04 DIAGNOSIS — Z124 Encounter for screening for malignant neoplasm of cervix: Secondary | ICD-10-CM | POA: Diagnosis not present

## 2021-02-04 DIAGNOSIS — N6320 Unspecified lump in the left breast, unspecified quadrant: Secondary | ICD-10-CM

## 2021-02-04 DIAGNOSIS — B9689 Other specified bacterial agents as the cause of diseases classified elsewhere: Secondary | ICD-10-CM

## 2021-02-04 DIAGNOSIS — Z0000 Encounter for general adult medical examination without abnormal findings: Secondary | ICD-10-CM

## 2021-02-04 DIAGNOSIS — Z1329 Encounter for screening for other suspected endocrine disorder: Secondary | ICD-10-CM

## 2021-02-04 DIAGNOSIS — N760 Acute vaginitis: Secondary | ICD-10-CM

## 2021-02-04 DIAGNOSIS — E1159 Type 2 diabetes mellitus with other circulatory complications: Secondary | ICD-10-CM | POA: Diagnosis not present

## 2021-02-04 DIAGNOSIS — Z6841 Body Mass Index (BMI) 40.0 and over, adult: Secondary | ICD-10-CM

## 2021-02-04 DIAGNOSIS — I10 Essential (primary) hypertension: Secondary | ICD-10-CM | POA: Diagnosis not present

## 2021-02-04 DIAGNOSIS — E559 Vitamin D deficiency, unspecified: Secondary | ICD-10-CM | POA: Diagnosis not present

## 2021-02-04 DIAGNOSIS — E669 Obesity, unspecified: Secondary | ICD-10-CM

## 2021-02-04 DIAGNOSIS — Z23 Encounter for immunization: Secondary | ICD-10-CM

## 2021-02-04 DIAGNOSIS — Z1211 Encounter for screening for malignant neoplasm of colon: Secondary | ICD-10-CM

## 2021-02-04 DIAGNOSIS — E785 Hyperlipidemia, unspecified: Secondary | ICD-10-CM

## 2021-02-04 DIAGNOSIS — E1169 Type 2 diabetes mellitus with other specified complication: Secondary | ICD-10-CM

## 2021-02-04 MED ORDER — AMLODIPINE BESYLATE 10 MG PO TABS: 10.0000 mg | ORAL_TABLET | Freq: Every day | ORAL | 3 refills | Status: DC

## 2021-02-04 MED ORDER — METRONIDAZOLE 500 MG PO TABS: 500.0000 mg | ORAL_TABLET | Freq: Two times a day (BID) | ORAL | 2 refills | Status: DC

## 2021-02-04 NOTE — Progress Notes (Signed)
Chief Complaint  Patient presents with   Annual Exam   Annual  1.DM2  Htn uncontrolled on norvasc 5 mg qd, lis 40 mg qd, metformin 500 mg bid, toprol xl 25 mg qd, pravachol 40 mg qhs, spironolactone 50 mg qd  Of note she never took effexor 150 mg xr yet for mood reviewed could cause elevated BP [facility] exam 02/16/21   2. Pap due and will refer for colonoscopy   3. Left breast inner mass she reports trauma hit the wall weeks ago but knot is still there    Review of Systems  Constitutional:  Negative for weight loss.  HENT:  Negative for hearing loss.   Eyes:  Negative for blurred vision.  Respiratory:  Negative for shortness of breath.   Cardiovascular:  Negative for chest pain.  Gastrointestinal:  Negative for abdominal pain and blood in stool.  Genitourinary:  Negative for dysuria.  Musculoskeletal:  Negative for falls and joint pain.  Skin:  Negative for rash.  Neurological:  Negative for headaches.  Psychiatric/Behavioral:  Negative for depression.   Past Medical History:  Diagnosis Date   Alcohol abuse    Arthritis    Cervical radiculopathy    Diabetes mellitus without complication ([place]) 6468   Headache    Herpes    1 and 2 since 45 y.o    Hyperlipidemia    Hypertension    Morbid obesity ([place])    OSA (obstructive sleep apnea)    Not on CPAP   Psoriasis    Tobacco abuse    Past Surgical History:  Procedure Laterality Date   No past surgery.     Family History  Problem Relation Age of Onset   Alcohol abuse Maternal Grandmother    Cancer Maternal Grandmother        Ovarian   Hypertension Maternal Grandmother    Diabetes Maternal Grandmother    Hypertension Mother    Diabetes Mother    Colon cancer Other 13   Breast cancer Other        + FH   Aneurysm Maternal Grandfather    Alcohol abuse Maternal Grandfather    Other Father        unsure of medical history   Social History   Socioeconomic History   Marital status: Single    Spouse name:  Not on file   Number of children: 0   Years of education: college   Highest education level: Associate degree: academic program  Occupational History   Occupation: customer service  Tobacco Use   Smoking status: Former    Packs/day: 0.50    Types: Cigarettes   Smokeless tobacco: Never   Tobacco comments:    stopped 11/2015  Vaping Use   Vaping Use: Never used  Substance and Sexual Activity   Alcohol use: Yes    Comment: occasionally   Drug use: No   Sexual activity: Yes    Partners: Male    Birth control/protection: None    Comment: 1 partner  Other Topics Concern   Not on file  Social History Narrative   Lives with her mother.   Right-handed.   No daily caffeine use.   Social Determinants of Health   Financial Resource Strain: Not on file  Food Insecurity: Not on file  Transportation Needs: Not on file  Physical Activity: Not on file  Stress: Not on file  Social Connections: Not on file  Intimate Partner Violence: Not on file   Current Meds  Medication Sig   acetaminophen (TYLENOL) 650 MG CR tablet Take 650 mg by mouth every 8 (eight) hours as needed for pain.    albuterol (VENTOLIN HFA) 108 (90 Base) MCG/ACT inhaler Inhale 1-2 puffs into the lungs every 4 (four) hours as needed for wheezing or shortness of breath.   aspirin 81 MG tablet Take 81 mg by mouth daily.   Cholecalciferol 1.25 MG (50000 UT) capsule Take 1 capsule (50,000 Units total) by mouth once a week.   cyclobenzaprine (FLEXERIL) 5 MG tablet Take 1 tablet (5 mg total) by mouth daily as needed for muscle spasms.   Fluocinolone Acetonide Body 0.01 % OIL Apply 1 application topically at bedtime. Apply to aa psoriasis scalp and body qd   ketoconazole (NIZORAL) 2 % shampoo Apply 1 application topically once a week. Shampoo scalp weekly, let sit 5 minutes and rinse out   lisinopril (ZESTRIL) 40 MG tablet Take 1 tablet (40 mg total) by mouth daily.   metFORMIN (GLUCOPHAGE) 500 MG tablet TAKE 1 TABLET BY MOUTH   TWICE DAILY WITH FOOD   metoprolol succinate (TOPROL-XL) 25 MG 24 hr tablet Take 1 tablet (25 mg total) by mouth daily. At night   metroNIDAZOLE (FLAGYL) 500 MG tablet Take 1 tablet (500 mg total) by mouth 2 (two) times daily.   ondansetron (ZOFRAN ODT) 4 MG disintegrating tablet Take 1 tablet (4 mg total) by mouth every 8 (eight) hours as needed for nausea or vomiting.   pravastatin (PRAVACHOL) 40 MG tablet Take 1 tablet (40 mg total) by mouth daily.   Risankizumab-rzaa (SKYRIZI) 150 MG/ML SOSY Inject 150 mg into the skin as directed. Every 12 weeks for maintenance.   rizatriptan (MAXALT-MLT) 10 MG disintegrating tablet Take 1 tablet (10 mg total) by mouth as needed for migraine. May repeat in 2 hours if needed   spironolactone (ALDACTONE) 50 MG tablet Take 1 tablet (50 mg total) by mouth daily. In am   valACYclovir (VALTREX) 500 MG tablet Take 1 tablet (500 mg total) by mouth daily. Bid x 5 days prn   venlafaxine XR (EFFEXOR XR) 150 MG 24 hr capsule Take 1 capsule (150 mg total) by mouth daily with breakfast.   [DISCONTINUED] amLODipine (NORVASC) 5 MG tablet Take 1 tablet (5 mg total) by mouth daily.   No Known Allergies Recent Results (from the past 2160 hour(s))  QuantiFERON-TB Gold Plus     Status: None   Collection Time: 11/16/20  8:53 AM  Result Value Ref Range   QuantiFERON Incubation Incubation performed.    QuantiFERON Criteria Comment     Comment: QuantiFERON-TB Gold Plus is a qualitative indirect test for M tuberculosis infection (including disease) and is intended for use in conjunction with risk assessment, radiography, and other medical and diagnostic evaluations. The QuantiFERON-TB Gold Plus result is determined by subtracting the Nil value from either TB antigen (Ag) value. The Mitogen tube serves as a control for the test.    QuantiFERON TB1 Ag Value 0.07 IU/mL   QuantiFERON TB2 Ag Value 0.01 IU/mL   QuantiFERON Nil Value 0.01 IU/mL   QuantiFERON Mitogen Value >10.00  IU/mL   QuantiFERON-TB Gold Plus Negative Negative    Comment: No response to M tuberculosis antigens detected. Infection with M tuberculosis is unlikely, but high risk individuals should be considered for additional testing (ATS/IDSA/CDC Clinical Practice Guidelines, 2017). The reference range is an Antigen minus Nil result of <0.35 IU/mL. Chemiluminescence immunoassay methodology    Objective  Body mass index is 45.86 kg/m.  Wt Readings from Last 3 Encounters:  02/04/21 267 lb 3.2 oz (121.2 kg)  01/04/21 257 lb 8 oz (116.8 kg)  08/20/20 269 lb (122 kg)   Temp Readings from Last 3 Encounters:  02/04/21 98 F (36.7 C) (Oral)  09/29/20 98.6 F (37 C) (Oral)  09/24/20 98.6 F (37 C) (Oral)   BP Readings from Last 3 Encounters:  02/04/21 (!) 134/94  01/04/21 (!) 179/99  09/29/20 (!) 176/104   Pulse Readings from Last 3 Encounters:  02/04/21 80  01/04/21 75  09/29/20 83    Physical Exam Vitals and nursing note reviewed.  Constitutional:      Appearance: Normal appearance. She is well-developed and well-groomed. She is morbidly obese.  HENT:     Head: Normocephalic and atraumatic.  Eyes:     Conjunctiva/sclera: Conjunctivae normal.     Pupils: Pupils are equal, round, and reactive to light.  Cardiovascular:     Rate and Rhythm: Normal rate and regular rhythm.     Heart sounds: Normal heart sounds. No murmur heard. Pulmonary:     Effort: Pulmonary effort is normal.     Breath sounds: Normal breath sounds.  Chest:     Chest wall: Mass present.  Breasts:    Right: Normal.     Left: Mass present.    Abdominal:     General: Abdomen is flat. Bowel sounds are normal.     Tenderness: There is no abdominal tenderness.  Genitourinary:    Exam position: Supine.     Pubic Area: No rash.      Labia:        Right: No rash.        Left: No rash.      Vagina: Normal.     Cervix: Normal.     Uterus: Normal.      Adnexa: Right adnexa normal and left adnexa normal.   Lymphadenopathy:     Upper Body:     Right upper body: No axillary adenopathy.     Left upper body: No axillary adenopathy.  Skin:    General: Skin is warm and dry.  Neurological:     General: No focal deficit present.     Mental Status: She is alert and oriented to person, place, and time. Mental status is at baseline.     Gait: Gait normal.  Psychiatric:        Attention and Perception: Attention and perception normal.        Mood and Affect: Mood and affect normal.        Speech: Speech normal.        Behavior: Behavior normal. Behavior is cooperative.        Thought Content: Thought content normal.        Cognition and Memory: Cognition and memory normal.        Judgment: Judgment normal.    Assessment  Plan  Annual physical exam Flu utd  Tdap had due again  pna 23 had due again sent prevnar to [facility] covid 2/2 moderna consider booster 3rd dose  Hep B immune Hep C negative 08/17/20   mammo 03/04/19 with bx negative 03/08/19 left breast clip -ordered pt to call and schedule [place] left breast mass 9-11 oclock pt lost to f/u   Pap 08/24/17 neg pap neg HPV  Pap today sent to [facility]   Colonoscopy consider age 15 referred [facility] no FH colon cancer  Referred [facility]   rec healthy diet and exercise  rec MMR vx health dept  Rec healthy diet and exercise    Essential hypertension sl elevated with dm 2 08/17/20 A1C 6.2 - Plan:  Increase norvasc 5 mg to amLODipine (NORVASC) 10 MG tablet, metformin 500 mg qd, lis 40 mg qd, spironolactone 50 mg qd [facility] labs CBC with Differential/Platelet, Comprehensive metabolic panel, Lipid panel, Microalbumin / creatinine urine ratio, Urinalysis, Routine w reflex microscopic [facility]  Consider moujaro in the future disc today pt declines Hold effexor 150 xl qd due to c/w htn worse Monitor BP  Routine cervical smear - Plan: IGP,CtNgTv,Apt HPV,rfx16/18,45  Vitamin D deficiency - Plan: Vitamin D (25 hydroxy  Obesity, diabetes, and  hypertension syndrome ([place]) - Plan: Hemoglobin A1c, Microalbumin / creatinine urine ratio, Urinalysis, Routine w reflex microscopic,  Cont meds disc mounjaro  [facility] exam 02/16/21 will need report   Screen for STD (sexually transmitted disease) - Plan: HIV antibody (with reflex), HSV(herpes simplex vrs) 1+2 ab-IgG, RPR, CANCELED: RPR, CANCELED: HIV antibody (with reflex), CANCELED: HSV(herpes smplx vrs)abs-I+II(IgG)-CSF  Bacterial vaginosis, recurrent - Plan: metroNIDAZOLE (FLAGYL) 500 MG tablet bid  F/u ob/gyn  Mass of left breast, unspecified quadrant - Plan: MM DIAG BREAST TOMO BILATERAL, US BREAST LTD UNI LEFT INC AXILLA  Morbid obesity with BMI of 45.0-49.9, adult (HCC)  Rec healthy diet and exercise  Consider mounjaro pt will think about it    Provider: Dr. Olivia Mackie McLean-Scocuzza-Internal Medicine

## 2021-02-04 NOTE — Patient Instructions (Addendum)
Please call and schedule mammogram asap Injection for weight loss Increase norvasc to 10 mg daily   Tirzepatide Injection What is this medication? TIRZEPATIDE (tir ZEP a tide) treats type 2 diabetes. It works by increasing insulin levels in your body, which decreases your blood sugar (glucose). Changes to diet and exercise are often combined with this medication. This medicine may be used for other purposes; ask your health care provider or pharmacist if you have questions. COMMON BRAND NAME(S): MOUNJARO What should I tell my care team before I take this medication? They need to know if you have any of these conditions: Endocrine tumors (MEN 2) or if someone in your family had these tumors Eye disease, vision problems Gallbladder disease History of pancreatitis Kidney disease Stomach or intestine problems Thyroid cancer or if someone in your family had thyroid cancer An unusual or allergic reaction to tirzepatide, other medications, foods, dyes, or preservatives Pregnant or trying to get pregnant Breast-feeding How should I use this medication? This medication is injected under the skin. You will be taught how to prepare and give it. It is given once every week (every 7 days). Keep taking it unless your health care provider tells you to stop. If you use this medication with insulin, you should inject this medication and the insulin separately. Do not mix them together. Do not give the injections right next to each other. Change (rotate) injection sites with each injection. This medication comes with INSTRUCTIONS FOR USE. Ask your pharmacist for directions on how to use this medication. Read the information carefully. Talk to your pharmacist or care team if you have questions. It is important that you put your used needles and syringes in a special sharps container. Do not put them in a trash can. If you do not have a sharps container, call your pharmacist or care team to get one. A special  MedGuide will be given to you by the pharmacist with each prescription and refill. Be sure to read this information carefully each time. Talk to your care team about the use of this medication in children. Special care may be needed. Overdosage: If you think you have taken too much of this medicine contact a poison control center or emergency room at once. NOTE: This medicine is only for you. Do not share this medicine with others. What if I miss a dose? If you miss a dose, take it as soon as you can unless it is more than 4 days (96 hours) late. If it is more than 4 days late, skip the missed dose. Take the next dose at the normal time. Do not take 2 doses within 3 days of each other. What may interact with this medication? Alcohol containing beverages Antiviral medications for HIV or AIDS Aspirin and aspirin-like medications Beta-blockers like atenolol, metoprolol, propranolol Certain medications for blood pressure, heart disease, irregular heart beat Chromium Clonidine Diuretics Female hormones, such as estrogens or progestins, birth control pills Fenofibrate Gemfibrozil Guanethidine Isoniazid Lanreotide Female hormones or anabolic steroids MAOIs like Carbex, Eldepryl, Marplan, Nardil, and Parnate Medications for weight loss Medications for allergies, asthma, cold, or cough Medications for depression, anxiety, or psychotic disturbances Niacin Nicotine NSAIDs, medications for pain and inflammation, like ibuprofen or naproxen Octreotide Other medications for diabetes, like glyburide, glipizide, or glimepiride Pasireotide Pentamidine Phenytoin Probenecid Quinolone antibiotics such as ciprofloxacin, levofloxacin, ofloxacin Reserpine Some herbal dietary supplements Steroid medications such as prednisone or cortisone Sulfamethoxazole; trimethoprim Thyroid hormones Warfarin This list may not describe all possible interactions.  Give your health care provider a list of all the  medicines, herbs, non-prescription drugs, or dietary supplements you use. Also tell them if you smoke, drink alcohol, or use illegal drugs. Some items may interact with your medicine. What should I watch for while using this medication? Visit your care team for regular checks on your progress. Drink plenty of fluids while taking this medication. Check with your care team if you get an attack of severe diarrhea, nausea, and vomiting. The loss of too much body fluid can make it dangerous for you to take this medication. A test called the HbA1C (A1C) will be monitored. This is a simple blood test. It measures your blood sugar control over the last 2 to 3 months. You will receive this test every 3 to 6 months. Learn how to check your blood sugar. Learn the symptoms of low and high blood sugar and how to manage them. Always carry a quick-source of sugar with you in case you have symptoms of low blood sugar. Examples include hard sugar candy or glucose tablets. Make sure others know that you can choke if you eat or drink when you develop serious symptoms of low blood sugar, such as seizures or unconsciousness. They must get medical help at once. Tell your care team if you have high blood sugar. You might need to change the dose of your medication. If you are sick or exercising more than usual, you might need to change the dose of your medication. Do not skip meals. Ask your care team if you should avoid alcohol. Many nonprescription cough and cold products contain sugar or alcohol. These can affect blood sugar. Pens should never be shared. Even if the needle is changed, sharing may result in passing of viruses like hepatitis or HIV. Wear a medical ID bracelet or chain, and carry a card that describes your disease and details of your medication and dosage times. Birth control may not work properly while you are taking this medication. If you take birth control pills by mouth, your care team may recommend another  type of birth control for 4 weeks after you start this medication and for 4 weeks after each increase in your dose of this medication. Ask your care team which birth control methods you should use. What side effects may I notice from receiving this medication? Side effects that you should report to your care team as soon as possible: Allergic reactions-skin rash, itching, hives, swelling of the face, lips, tongue, or throat Change in vision Dehydration-increased thirst, dry mouth, feeling faint or lightheaded, headache, dark yellow or brown urine Gallbladder problems-severe stomach pain, nausea, vomiting, fever Kidney injury-decrease in the amount of urine, swelling of the ankles, hands, or feet Pancreatitis-severe stomach pain that spreads to your back or gets worse after eating or when touched, fever, nausea, vomiting Thyroid cancer-new mass or lump in the neck, pain or trouble swallowing, trouble breathing, hoarseness Side effects that usually do not require medical attention (report these to your care team if they continue or are bothersome): Constipation Diarrhea Loss of Appetite Nausea Stomach pain Upset stomach Vomiting This list may not describe all possible side effects. Call your doctor for medical advice about side effects. You may report side effects to FDA at 1-800-FDA-1088. Where should I keep my medication? Keep out of the reach of children and pets. Refrigeration (preferred): Store unopened pens in a refrigerator between 2 and 8 degrees C (36 and 46 degrees F). Keep it in the original carton  until you are ready to take it. Do not freeze or use if the medication has been frozen. Protect from light. Get rid of any unused medication after the expiration date on the label. Room Temperature: The pen may be stored at room temperature below 30 degrees C (86 degrees F) for up to a total of 21 days if needed. Protect from light. Avoid exposure to extreme heat. If it is stored at room  temperature, throw away any unused medication after 21 days or after it expires, whichever is first. The pen has glass parts. Handle it carefully. If you drop the pen on a hard surface, do not use it. Use a new pen for your injection. To get rid of medications that are no longer needed or have expired: Take the medication to a medication take-back program. Check with your pharmacy or law enforcement to find a location. If you cannot return the medication, ask your pharmacist or care team how to get rid of this medication safely. NOTE: This sheet is a summary. It may not cover all possible information. If you have questions about this medicine, talk to your doctor, pharmacist, or health care provider.  2022 Elsevier/Gold Standard (2020-08-18 13:57:48)  Lactobacillus probiotic   Bacterial Vaginosis Bacterial vaginosis is an infection that occurs when the normal balance of bacteria in the vagina changes. This change is caused by an overgrowth of certain bacteria in the vagina. Bacterial vaginosis is the most common vaginal infection among females aged 93 to 64 years. This condition increases the risk of sexually transmitted infections (STIs). Treatment can help reduce this risk. Treatment is very important for pregnant women because this condition can cause babies to be born early (prematurely) or at a low birth weight. What are the causes? This condition is caused by an increase in harmful bacteria that are normally present in small amounts in the vagina. However, the exact reason this condition develops is not known. You cannot get bacterial vaginosis from toilet seats, bedding, swimming pools, or contact with objects around you. What increases the risk? The following factors may make you more likely to develop this condition: Having a new sexual partner or multiple sexual partners, or having unprotected sex. Douching. Having an intrauterine device (IUD). Smoking. Abusing drugs and alcohol.  This may lead to riskier sexual behavior. Taking certain antibiotic medicines. Being pregnant. What are the signs or symptoms? Some women with this condition have no symptoms. Symptoms may include: Wallace Cullens or white vaginal discharge. The discharge can be watery or foamy. A fish-like odor with discharge, especially after sex or during menstruation. Itching in and around the vagina. Burning or pain with urination. How is this diagnosed? This condition is diagnosed based on: Your medical history. A physical exam of the vagina. Checking a sample of vaginal fluid for harmful bacteria or abnormal cells. How is this treated? This condition is treated with antibiotic medicines. These may be given as a pill, a vaginal cream, or a medicine that is put into the vagina (suppository). If the condition comes back after treatment, a second round of antibiotics may be needed. Follow these instructions at home: Medicines Take or apply over-the-counter and prescription medicines only as told by your health care provider. Take or apply your antibiotic medicine as told by your health care provider. Do not stop using the antibiotic even if you start to feel better. General instructions If you have a female sexual partner, tell her that you have a vaginal infection. She should follow up  with her health care provider. If you have a female sexual partner, he does not need treatment. Avoid sexual activity until you finish treatment. Drink enough fluid to keep your urine pale yellow. Keep the area around your vagina and rectum clean. Wash the area daily with warm water. Wipe yourself from front to back after using the toilet. If you are breastfeeding, talk to your health care provider about continuing breastfeeding during treatment. Keep all follow-up visits. This is important. How is this prevented? Self-care Do not douche. Wash the outside of your vagina with warm water only. Wear cotton or cotton-lined  underwear. Avoid wearing tight pants and pantyhose, especially during the summer. Safe sex Use protection when having sex. This includes: Using condoms. Using dental dams. This is a thin layer of a material made of latex or polyurethane that protects the mouth during oral sex. Limit the number of sexual partners. To help prevent bacterial vaginosis, it is best to have sex with just one partner (monogamous relationship). Make sure you and your sexual partner are tested for STIs. Drugs and alcohol Do not use any products that contain nicotine or tobacco. These products include cigarettes, chewing tobacco, and vaping devices, such as e-cigarettes. If you need help quitting, ask your health care provider. Do not use drugs. Do not drink alcohol if: Your health care provider tells you not to do this. You are pregnant, may be pregnant, or are planning to become pregnant. If you drink alcohol: Limit how much you have to 0-1 drink a day. Be aware of how much alcohol is in your drink. In the [place] one drink equals one 12 oz bottle of beer (355 mL), one 5 oz glass of wine (148 mL), or one 1 oz glass of hard liquor (44 mL). Where to find more information Centers for Disease Control and Prevention: FootballExhibition.com.br American Sexual Health Association (ASHA): www.ashastd.org [place] Department of Health and Health and safety inspector, Office on Women's Health: http://hoffman.com/ Contact a health care provider if: Your symptoms do not improve, even after treatment. You have more discharge or pain when urinating. You have a fever or chills. You have pain in your abdomen or pelvis. You have pain during sex. You have vaginal bleeding between menstrual periods. Summary Bacterial vaginosis is a vaginal infection that occurs when the normal balance of bacteria in the vagina changes. It results from an overgrowth of certain bacteria. This condition increases the risk of sexually transmitted infections (STIs). Getting treated  can help reduce this risk. Treatment is very important for pregnant women because this condition can cause babies to be born early (prematurely) or at low birth weight. This condition is treated with antibiotic medicines. These may be given as a pill, a vaginal cream, or a medicine that is put into the vagina (suppository). This information is not intended to replace advice given to you by your health care provider. Make sure you discuss any questions you have with your health care provider. Document Revised: 09/19/2019 Document Reviewed: 09/19/2019 Elsevier Patient Education  2022 [employer].

## 2021-02-04 NOTE — Telephone Encounter (Signed)
Lft pt vm with her appt to [facility] on 02/16/2021 arrive at 10:10 am. Thanks

## 2021-02-08 ENCOUNTER — Ambulatory Visit: Payer: Managed Care, Other (non HMO) | Admitting: Dermatology

## 2021-02-08 ENCOUNTER — Encounter: Payer: Self-pay | Admitting: Internal Medicine

## 2021-02-08 DIAGNOSIS — I152 Hypertension secondary to endocrine disorders: Secondary | ICD-10-CM | POA: Insufficient documentation

## 2021-02-08 DIAGNOSIS — E1159 Type 2 diabetes mellitus with other circulatory complications: Secondary | ICD-10-CM | POA: Insufficient documentation

## 2021-02-08 MED ORDER — TETANUS-DIPHTH-ACELL PERTUSSIS 5-2.5-18.5 LF-MCG/0.5 IM SUSP: 0.5000 mL | Freq: Once | INTRAMUSCULAR | 0 refills | Status: AC

## 2021-02-08 MED ORDER — LISINOPRIL 40 MG PO TABS: 40.0000 mg | ORAL_TABLET | Freq: Every day | ORAL | 3 refills | Status: DC

## 2021-02-08 MED ORDER — SPIRONOLACTONE 50 MG PO TABS: 50.0000 mg | ORAL_TABLET | Freq: Every day | ORAL | 3 refills | Status: DC

## 2021-02-08 MED ORDER — PRAVASTATIN SODIUM 40 MG PO TABS: 40.0000 mg | ORAL_TABLET | Freq: Every day | ORAL | 3 refills | Status: DC

## 2021-02-08 MED ORDER — PNEUMOCOCCAL 13-VAL CONJ VACC IM SUSP: 0.5000 mL | Freq: Once | INTRAMUSCULAR | 0 refills | Status: AC

## 2021-02-08 MED ORDER — METOPROLOL SUCCINATE ER 25 MG PO TB24: 25.0000 mg | ORAL_TABLET | Freq: Every day | ORAL | 3 refills | Status: DC

## 2021-02-09 LAB — IGP,CTNGTV,APT HPV,RFX16/18,45
Chlamydia, Nuc. Acid Amp: NEGATIVE
Gonococcus, Nuc. Acid Amp: NEGATIVE
HPV Aptima: NEGATIVE
Trich vag by NAA: NEGATIVE

## 2021-02-16 ENCOUNTER — Ambulatory Visit
Admission: RE | Admit: 2021-02-16 | Discharge: 2021-02-16 | Disposition: A | Discharge status: Home / Self Care | Payer: Managed Care, Other (non HMO) | Source: Ambulatory Visit | Attending: Internal Medicine | Admitting: Internal Medicine

## 2021-02-16 ENCOUNTER — Other Ambulatory Visit: Payer: Self-pay

## 2021-02-16 DIAGNOSIS — N6320 Unspecified lump in the left breast, unspecified quadrant: Secondary | ICD-10-CM | POA: Diagnosis present

## 2021-02-24 ENCOUNTER — Encounter: Payer: Self-pay | Admitting: Internal Medicine

## 2021-03-10 ENCOUNTER — Ambulatory Visit: Payer: Managed Care, Other (non HMO) | Admitting: Dermatology

## 2021-03-12 ENCOUNTER — Encounter: Payer: Self-pay | Admitting: Internal Medicine

## 2021-03-16 ENCOUNTER — Ambulatory Visit
Admission: RE | Admit: 2021-03-16 | Discharge: 2021-03-16 | Disposition: A | Discharge status: Home / Self Care | Payer: Managed Care, Other (non HMO) | Source: Ambulatory Visit | Attending: Emergency Medicine | Admitting: Emergency Medicine

## 2021-03-16 ENCOUNTER — Other Ambulatory Visit: Payer: Self-pay

## 2021-03-16 VITALS — BP 158/91 | HR 76 | Temp 98.1°F | Resp 18

## 2021-03-16 DIAGNOSIS — Z113 Encounter for screening for infections with a predominantly sexual mode of transmission: Secondary | ICD-10-CM | POA: Diagnosis not present

## 2021-03-16 DIAGNOSIS — I10 Essential (primary) hypertension: Secondary | ICD-10-CM | POA: Insufficient documentation

## 2021-03-16 NOTE — ED Triage Notes (Signed)
Pt presents with request for STD testing, including blood work.

## 2021-03-16 NOTE — Discharge Instructions (Addendum)
Your vaginal tests are pending.  If your test results are positive, we will call you.  Do not have sexual activity for at least 7 days.    Your blood pressure is elevated today at 185/99; repeat 158/91.  Please have this rechecked by your primary care provider in 1-2 weeks.

## 2021-03-16 NOTE — ED Provider Notes (Signed)
Roderic Palau    CSN: [id number] Arrival date & time: 03/16/21  1622      History   Chief Complaint Chief Complaint  Patient presents with   STD Check    HPI Nicole Mendez is a 45 y.o. female.  Patient presents with request for STD testing.  She believes her boyfriend has been unfaithful.  She denies symptoms.  No vaginal discharge, pelvic pain, rash, lesions, abdominal pain, dysuria, or other symptoms.  Her medical history includes HSV, hypertension, and diabetes.  The history is provided by the patient and medical records.   Past Medical History:  Diagnosis Date   Alcohol abuse    Arthritis    Cervical radiculopathy    Diabetes mellitus without complication ([place]) [id number]   Headache    Herpes    1 and 2 since 45 y.o    Hyperlipidemia    Hypertension    Morbid obesity ([place])    OSA (obstructive sleep apnea)    Not on CPAP   Psoriasis    Tobacco abuse     Patient Active Problem List   Diagnosis Date Noted   Obesity, diabetes, and hypertension syndrome ([place]) 02/08/2021   Rebound headache 01/04/2021   Iron deficiency anemia 08/19/2020   Vitamin D deficiency 08/19/2020   Chronic migraine w/o aura w/o status migrainosus, not intractable 05/14/2020   Arthritis of left knee 03/17/2020   Hypertension associated with diabetes ([place]) 10/03/2019   Trigger finger of left thumb 05/31/2019   Cellulitis of left breast 05/31/2019   GAD (generalized anxiety disorder) 02/21/2019   Prediabetes 02/21/2019   COVID-19 01/16/2019   Plantar fasciitis of right foot 01/30/2018   GERD (gastroesophageal reflux disease) 08/24/2017   Acute pain of left shoulder 03/20/2017   Cervical radiculopathy 03/20/2017   Anemia 08/22/2016   Annual physical exam 02/02/2016   Chronic headache 08/20/2015   Alcohol abuse    Cervical radiculopathy at C6 07/19/2015   Trapezius strain 04/21/2015   Essential hypertension 08/20/2014   OSA on CPAP 08/20/2014   Type 2 diabetes mellitus ([place])  08/20/2014   Mass of left breast 08/15/2008   Hyperlipidemia 01/10/2008   Morbid obesity with BMI of 45.0-49.9, adult ([place]) 01/10/2008   Bacterial vaginosis 12/28/2007   PSORIASIS 12/17/2007   ALLERGIC RHINITIS 09/24/2007   HSV (herpes simplex virus) infection 07/03/2004    Past Surgical History:  Procedure Laterality Date   No past surgery.      OB History     Gravida  0   Para  0   Term  0   Preterm  0   AB  0   Living  0      SAB  0   IAB  0   Ectopic  0   Multiple  0   Live Births               Home Medications    Prior to Admission medications   Medication Sig Start Date End Date Taking? Authorizing Provider  acetaminophen (TYLENOL) 650 MG CR tablet Take 650 mg by mouth every 8 (eight) hours as needed for pain.     [provider]  albuterol (VENTOLIN HFA) 108 (90 Base) MCG/ACT inhaler Inhale 1-2 puffs into the lungs every 4 (four) hours as needed for wheezing or shortness of breath. 01/29/19   McLean-Scocuzza, Nino Glow, MD  amLODipine (NORVASC) 10 MG tablet Take 1 tablet (10 mg total) by mouth daily. 02/04/21   McLean-Scocuzza, Nino Glow, MD  aspirin 81 MG tablet Take 81 mg by mouth daily.    [provider]  Cholecalciferol 1.25 MG (50000 UT) capsule Take 1 capsule (50,000 Units total) by mouth once a week. 08/19/20   McLean-Scocuzza, Nino Glow, MD  cyclobenzaprine (FLEXERIL) 5 MG tablet Take 1 tablet (5 mg total) by mouth daily as needed for muscle spasms. 08/24/17   McLean-Scocuzza, Nino Glow, MD  Fluocinolone Acetonide Body 0.01 % OIL Apply 1 application topically at bedtime. Apply to aa psoriasis scalp and body qd 08/17/20 08/17/21  Brendolyn Patty, MD  ketoconazole (NIZORAL) 2 % shampoo Apply 1 application topically once a week. Shampoo scalp weekly, let sit 5 minutes and rinse out 08/17/20   Brendolyn Patty, MD  lisinopril (ZESTRIL) 40 MG tablet Take 1 tablet (40 mg total) by mouth daily. 02/08/21   McLean-Scocuzza, Nino Glow, MD  metFORMIN  (GLUCOPHAGE) 500 MG tablet TAKE 1 TABLET BY MOUTH  TWICE DAILY WITH FOOD 08/03/20   McLean-Scocuzza, Nino Glow, MD  metoprolol succinate (TOPROL-XL) 25 MG 24 hr tablet Take 1 tablet (25 mg total) by mouth daily. At night 02/08/21   McLean-Scocuzza, Nino Glow, MD  metroNIDAZOLE (FLAGYL) 500 MG tablet Take 1 tablet (500 mg total) by mouth 2 (two) times daily. 02/04/21   McLean-Scocuzza, Nino Glow, MD  ondansetron (ZOFRAN ODT) 4 MG disintegrating tablet Take 1 tablet (4 mg total) by mouth every 8 (eight) hours as needed for nausea or vomiting. 01/04/21   Marcial Pacas, MD  pravastatin (PRAVACHOL) 40 MG tablet Take 1 tablet (40 mg total) by mouth daily. 02/08/21   McLean-Scocuzza, Nino Glow, MD  Risankizumab-rzaa [facility]) 150 MG/ML SOSY Inject 150 mg into the skin as directed. Every 12 weeks for maintenance. 09/14/20   Brendolyn Patty, MD  rizatriptan (MAXALT-MLT) 10 MG disintegrating tablet Take 1 tablet (10 mg total) by mouth as needed for migraine. May repeat in 2 hours if needed 01/04/21   Marcial Pacas, MD  spironolactone (ALDACTONE) 50 MG tablet Take 1 tablet (50 mg total) by mouth daily. In am 02/08/21   McLean-Scocuzza, Nino Glow, MD  valACYclovir (VALTREX) 500 MG tablet Take 1 tablet (500 mg total) by mouth daily. Bid x 5 days prn 03/10/20   McLean-Scocuzza, Nino Glow, MD  venlafaxine XR (EFFEXOR XR) 150 MG 24 hr capsule Take 1 capsule (150 mg total) by mouth daily with breakfast. 01/04/21   Marcial Pacas, MD    Family History Family History  Problem Relation Age of Onset   Alcohol abuse Maternal Grandmother    Cancer Maternal Grandmother        Ovarian   Hypertension Maternal Grandmother    Diabetes Maternal Grandmother    Hypertension Mother    Diabetes Mother    Colon cancer Other 76   Breast cancer Other        + FH   Aneurysm Maternal Grandfather    Alcohol abuse Maternal Grandfather    Other Father        unsure of medical history    Social History Social History   Tobacco Use   Smoking status: Former     Packs/day: 0.50    Types: Cigarettes   Smokeless tobacco: Never   Tobacco comments:    stopped 11/2015  Vaping Use   Vaping Use: Never used  Substance Use Topics   Alcohol use: Yes    Comment: occasionally   Drug use: No     Allergies   Patient has no known allergies.   Review of Systems  Review of Systems  Constitutional:  Negative for chills and fever.  Respiratory:  Negative for cough and shortness of breath.   Cardiovascular:  Negative for chest pain and palpitations.  Gastrointestinal:  Negative for abdominal pain and vomiting.  Genitourinary:  Negative for dysuria, flank pain, hematuria, pelvic pain and vaginal discharge.  Skin:  Negative for color change and rash.  All other systems reviewed and are negative.   Physical Exam Triage Vital Signs ED Triage Vitals [03/16/21 1634]  Enc Vitals Group     BP 134/71     Pulse Rate 76     Resp 18     Temp 98.1 F (36.7 C)     Temp Source Oral     SpO2 95 %     Weight      Height      Head Circumference      Peak Flow      Pain Score      Pain Loc      Pain Edu?      Excl. in [place]?    No data found.  Updated Vital Signs BP (!) 158/91 (BP Location: Left Arm)    Pulse 76    Temp 98.1 F (36.7 C) (Oral)    Resp 18    SpO2 95%   Visual Acuity Right Eye Distance:   Left Eye Distance:   Bilateral Distance:    Right Eye Near:   Left Eye Near:    Bilateral Near:     Physical Exam Vitals and nursing note reviewed.  Constitutional:      General: She is not in acute distress.    Appearance: She is well-developed. She is obese.  HENT:     Mouth/Throat:     Mouth: Mucous membranes are moist.  Cardiovascular:     Rate and Rhythm: Normal rate and regular rhythm.     Heart sounds: Normal heart sounds.  Pulmonary:     Effort: Pulmonary effort is normal. No respiratory distress.     Breath sounds: Normal breath sounds.  Abdominal:     Palpations: Abdomen is soft.     Tenderness: There is no abdominal  tenderness. There is no right CVA tenderness, left CVA tenderness, guarding or rebound.  Musculoskeletal:     Cervical back: Neck supple.  Skin:    General: Skin is warm and dry.  Neurological:     Mental Status: She is alert.  Psychiatric:        Mood and Affect: Mood normal.        Behavior: Behavior normal.     UC Treatments / Results  Labs (all labs ordered are listed, but only abnormal results are displayed) Labs Reviewed  RPR  HIV ANTIBODY (ROUTINE TESTING W REFLEX)  CERVICOVAGINAL ANCILLARY ONLY    EKG   Radiology No results found.  Procedures Procedures (including critical care time)  Medications Ordered in UC Medications - No data to display  Initial Impression / Assessment and Plan / UC Course  I have reviewed the triage vital signs and the nursing notes.  Pertinent labs & imaging results that were available during my care of the patient were reviewed by me and considered in my medical decision making (see chart for details).  Screening for STDs.  Elevated blood pressure reading hypertension. Patient obtained self swab for testing.  Blood obtained for HIV and RPR.  Discussed that we will call if test results are positive.  Discussed that she may require treatment if  test results are positive.  Instructed patient to abstain from sexual activity for at least 7 days.  Also discussed that her blood pressure is elevated today and needs to be rechecked by her PCP in 1 to 2 weeks.  Education provided on managing hypertension.  Patient agrees to plan of care.    Final Clinical Impressions(s) / UC Diagnoses   Final diagnoses:  Screening for STD (sexually transmitted disease)  Elevated blood pressure reading in office with diagnosis of hypertension     Discharge Instructions      Your vaginal tests are pending.  If your test results are positive, we will call you.  Do not have sexual activity for at least 7 days.    Your blood pressure is elevated today at  185/99; repeat 158/91.  Please have this rechecked by your primary care provider in 1-2 weeks.          ED Prescriptions   None    PDMP not reviewed this encounter.   Mickie Bail, NP 03/16/21 1700

## 2021-03-17 LAB — CERVICOVAGINAL ANCILLARY ONLY
Bacterial Vaginitis (gardnerella): POSITIVE — AB
Candida Glabrata: NEGATIVE
Candida Vaginitis: NEGATIVE
Chlamydia: NEGATIVE
Comment: NEGATIVE
Comment: NEGATIVE
Comment: NEGATIVE
Comment: NEGATIVE
Comment: NEGATIVE
Comment: NORMAL
Neisseria Gonorrhea: NEGATIVE
Trichomonas: NEGATIVE

## 2021-03-18 ENCOUNTER — Telehealth (HOSPITAL_COMMUNITY): Payer: Self-pay | Admitting: Emergency Medicine

## 2021-03-18 LAB — RPR, QUANT+TP ABS (REFLEX)
Rapid Plasma Reagin, Quant: 1:1 {titer} — ABNORMAL HIGH
T Pallidum Abs: REACTIVE — AB

## 2021-03-18 LAB — HIV ANTIBODY (ROUTINE TESTING W REFLEX): HIV Screen 4th Generation wRfx: NONREACTIVE

## 2021-03-18 LAB — RPR: RPR Ser Ql: REACTIVE — AB

## 2021-03-18 LAB — HM DIABETES EYE EXAM

## 2021-03-18 MED ORDER — METRONIDAZOLE 500 MG PO TABS: 500.0000 mg | ORAL_TABLET | Freq: Two times a day (BID) | ORAL | 0 refills | Status: DC

## 2021-03-30 ENCOUNTER — Encounter: Payer: Self-pay | Admitting: Internal Medicine

## 2021-03-31 ENCOUNTER — Encounter: Payer: Self-pay | Admitting: Internal Medicine

## 2021-03-31 NOTE — Telephone Encounter (Signed)
Letter in chart from 03/10/21. Okay to edit this to say Patient has no restrictions?

## 2021-05-17 ENCOUNTER — Encounter: Payer: Self-pay | Admitting: Dermatology

## 2021-05-17 ENCOUNTER — Encounter: Payer: Self-pay | Admitting: Internal Medicine

## 2021-05-26 ENCOUNTER — Telehealth: Payer: Self-pay | Admitting: Internal Medicine

## 2021-05-26 ENCOUNTER — Ambulatory Visit: Payer: Managed Care, Other (non HMO) | Admitting: Internal Medicine

## 2021-05-26 NOTE — Telephone Encounter (Signed)
See telephone encounter 05/26/21

## 2021-05-26 NOTE — Telephone Encounter (Signed)
Patient no-showed today's appointment; appointment was for 05/26/21, provider notified for review of record. No Fee charged.   Letter sent for patient to call in and re-schedule.

## 2021-05-31 ENCOUNTER — Ambulatory Visit: Payer: Managed Care, Other (non HMO) | Admitting: Dermatology

## 2021-06-08 ENCOUNTER — Encounter: Payer: Self-pay | Admitting: Internal Medicine

## 2021-06-14 ENCOUNTER — Encounter: Payer: Self-pay | Admitting: Internal Medicine

## 2021-06-14 NOTE — Telephone Encounter (Signed)
Message added to  06/14/21 Patient message encounter  ?

## 2021-06-14 NOTE — Telephone Encounter (Signed)
Nicole Mendez  P [facility] (supporting McLean-Scocuzza, Pasty Spillers, MD) 6 days ago  ? ?Good morning.. how are you and the baby? Did i ever get a flu shot in 2022 if so can I get a copy of it ?

## 2021-06-28 ENCOUNTER — Other Ambulatory Visit: Payer: Self-pay

## 2021-06-28 ENCOUNTER — Ambulatory Visit
Admission: RE | Admit: 2021-06-28 | Discharge: 2021-06-28 | Disposition: A | Discharge status: Home / Self Care | Payer: Managed Care, Other (non HMO) | Source: Ambulatory Visit | Attending: Family Medicine | Admitting: Family Medicine

## 2021-06-28 VITALS — BP 154/87 | HR 84 | Temp 98.2°F | Resp 20

## 2021-06-28 DIAGNOSIS — N760 Acute vaginitis: Secondary | ICD-10-CM | POA: Diagnosis not present

## 2021-06-28 DIAGNOSIS — N390 Urinary tract infection, site not specified: Secondary | ICD-10-CM

## 2021-06-28 DIAGNOSIS — Z87891 Personal history of nicotine dependence: Secondary | ICD-10-CM | POA: Insufficient documentation

## 2021-06-28 DIAGNOSIS — Z113 Encounter for screening for infections with a predominantly sexual mode of transmission: Secondary | ICD-10-CM | POA: Insufficient documentation

## 2021-06-28 LAB — POCT URINALYSIS DIP (MANUAL ENTRY)
Bilirubin, UA: NEGATIVE
Blood, UA: NEGATIVE
Glucose, UA: NEGATIVE mg/dL
Ketones, POC UA: NEGATIVE mg/dL
Nitrite, UA: POSITIVE — AB
Protein Ur, POC: NEGATIVE mg/dL
Spec Grav, UA: 1.02 (ref 1.010–1.025)
Urobilinogen, UA: 0.2 E.U./dL
pH, UA: 7 (ref 5.0–8.0)

## 2021-06-28 MED ORDER — METRONIDAZOLE 500 MG PO TABS: 500.0000 mg | ORAL_TABLET | Freq: Two times a day (BID) | ORAL | 0 refills | Status: DC

## 2021-06-28 MED ORDER — FLUCONAZOLE 150 MG PO TABS: 150.0000 mg | ORAL_TABLET | ORAL | 0 refills | Status: DC

## 2021-06-28 MED ORDER — NITROFURANTOIN MONOHYD MACRO 100 MG PO CAPS: 100.0000 mg | ORAL_CAPSULE | Freq: Two times a day (BID) | ORAL | 0 refills | Status: DC

## 2021-06-28 NOTE — Discharge Instructions (Addendum)
Take Macrobid 100 mg twice daily for 5 days for treatment of your urinary tract infection.  Drink plenty of fluids.  Today take 1 dose of the Diflucan to prevent developing a antibiotic associated yeast infection.  After completing Macrobid start metronidazole tablets twice a day for 7 days for treatment of vaginitis likely related to BV.  Following treatment of BV take second dose of Diflucan to prevent yeast associated with antibiotic treatment. ?Urine culture is pending.  If any changes in treatment is warranted after receipt of your lab test we will notify you by phone. ?

## 2021-06-28 NOTE — ED Triage Notes (Signed)
Pt here with sharp suprapubic pain and vaginal discharge that is different from her normal. Hx of BV ?

## 2021-06-28 NOTE — ED Provider Notes (Signed)
Renaldo Fiddler    CSN: [id number] Arrival date & time: 06/28/21  1523      History   Chief Complaint Chief Complaint  Patient presents with   Abdominal Pain    Entered by patient   Vaginal Discharge    HPI Nicole Mendez is a 46 y.o. female.   HPI Nicole Mendez is a 46 y.o. female presents for evaluation of urinary frequency, urgency and dysuria x few days, without flank pain, fever, chills, abnormal bleeding.  She endorses some suprapubic pressure and discomfort.  She is unaware of any known STD exposure.  Patient also endorses some abnormal vaginal discharge and has a history of recurrent BV.  She would like STI testing. Past Medical History:  Diagnosis Date   Alcohol abuse    Arthritis    Cervical radiculopathy    Diabetes mellitus without complication ([facility]) 2000   Headache    Herpes    1 and 2 since 46 y.o    Hyperlipidemia    Hypertension    Morbid obesity ([facility])    OSA (obstructive sleep apnea)    Not on CPAP   Psoriasis    Tobacco abuse     Patient Active Problem List   Diagnosis Date Noted   Obesity, diabetes, and hypertension syndrome ([facility]) 02/08/2021   Rebound headache 01/04/2021   Iron deficiency anemia 08/19/2020   Vitamin D deficiency 08/19/2020   Chronic migraine w/o aura w/o status migrainosus, not intractable 05/14/2020   Arthritis of left knee 03/17/2020   Hypertension associated with diabetes ([facility]) 10/03/2019   Trigger finger of left thumb 05/31/2019   Cellulitis of left breast 05/31/2019   GAD (generalized anxiety disorder) 02/21/2019   Prediabetes 02/21/2019   COVID-19 01/16/2019   Plantar fasciitis of right foot 01/30/2018   GERD (gastroesophageal reflux disease) 08/24/2017   Acute pain of left shoulder 03/20/2017   Cervical radiculopathy 03/20/2017   Anemia 08/22/2016   Annual physical exam 02/02/2016   Chronic headache 08/20/2015   Alcohol abuse    Cervical radiculopathy at C6 07/19/2015   Trapezius strain  04/21/2015   Essential hypertension 08/20/2014   OSA on CPAP 08/20/2014   Type 2 diabetes mellitus ([facility]) 08/20/2014   Mass of left breast 08/15/2008   Hyperlipidemia 01/10/2008   Morbid obesity with BMI of 45.0-49.9, adult ([facility]) 01/10/2008   Bacterial vaginosis 12/28/2007   PSORIASIS 12/17/2007   ALLERGIC RHINITIS 09/24/2007   HSV (herpes simplex virus) infection 07/03/2004    Past Surgical History:  Procedure Laterality Date   No past surgery.      OB History     Gravida  0   Para  0   Term  0   Preterm  0   AB  0   Living  0      SAB  0   IAB  0   Ectopic  0   Multiple  0   Live Births               Home Medications    Prior to Admission medications   Medication Sig Start Date End Date Taking? Authorizing Provider  fluconazole (DIFLUCAN) 150 MG tablet Take 1 tablet (150 mg total) by mouth every 3 (three) days. Repeat if needed 06/28/21  Yes Bing Neighbors, FNP  metroNIDAZOLE (FLAGYL) 500 MG tablet Take 1 tablet (500 mg total) by mouth 2 (two) times daily with a meal. DO NOT CONSUME ALCOHOL WHILE TAKING THIS MEDICATION. 06/28/21  Yes Rada Zegers,  Godfrey Pick, FNP  nitrofurantoin, macrocrystal-monohydrate, (MACROBID) 100 MG capsule Take 1 capsule (100 mg total) by mouth 2 (two) times daily. 06/28/21  Yes Bing Neighbors, FNP  acetaminophen (TYLENOL) 650 MG CR tablet Take 650 mg by mouth every 8 (eight) hours as needed for pain.     [provider]  albuterol (VENTOLIN HFA) 108 (90 Base) MCG/ACT inhaler Inhale 1-2 puffs into the lungs every 4 (four) hours as needed for wheezing or shortness of breath. 01/29/19   McLean-Scocuzza, Pasty Spillers, MD  amLODipine (NORVASC) 10 MG tablet Take 1 tablet (10 mg total) by mouth daily. 02/04/21   McLean-Scocuzza, Pasty Spillers, MD  aspirin 81 MG tablet Take 81 mg by mouth daily.    [provider]  Cholecalciferol 1.25 MG (50000 UT) capsule Take 1 capsule (50,000 Units total) by mouth once a week. 08/19/20    McLean-Scocuzza, Pasty Spillers, MD  cyclobenzaprine (FLEXERIL) 5 MG tablet Take 1 tablet (5 mg total) by mouth daily as needed for muscle spasms. 08/24/17   McLean-Scocuzza, Pasty Spillers, MD  Fluocinolone Acetonide Body 0.01 % OIL Apply 1 application topically at bedtime. Apply to aa psoriasis scalp and body qd 08/17/20 08/17/21  Willeen Niece, MD  ketoconazole (NIZORAL) 2 % shampoo Apply 1 application topically once a week. Shampoo scalp weekly, let sit 5 minutes and rinse out 08/17/20   Willeen Niece, MD  lisinopril (ZESTRIL) 40 MG tablet Take 1 tablet (40 mg total) by mouth daily. 02/08/21   McLean-Scocuzza, Pasty Spillers, MD  metFORMIN (GLUCOPHAGE) 500 MG tablet TAKE 1 TABLET BY MOUTH  TWICE DAILY WITH FOOD 08/03/20   McLean-Scocuzza, Pasty Spillers, MD  metoprolol succinate (TOPROL-XL) 25 MG 24 hr tablet Take 1 tablet (25 mg total) by mouth daily. At night 02/08/21   McLean-Scocuzza, Pasty Spillers, MD  ondansetron (ZOFRAN ODT) 4 MG disintegrating tablet Take 1 tablet (4 mg total) by mouth every 8 (eight) hours as needed for nausea or vomiting. 01/04/21   Levert Feinstein, MD  pravastatin (PRAVACHOL) 40 MG tablet Take 1 tablet (40 mg total) by mouth daily. 02/08/21   McLean-Scocuzza, Pasty Spillers, MD  Risankizumab-rzaa [facility]) 150 MG/ML SOSY Inject 150 mg into the skin as directed. Every 12 weeks for maintenance. 09/14/20   Willeen Niece, MD  rizatriptan (MAXALT-MLT) 10 MG disintegrating tablet Take 1 tablet (10 mg total) by mouth as needed for migraine. May repeat in 2 hours if needed 01/04/21   Levert Feinstein, MD  spironolactone (ALDACTONE) 50 MG tablet Take 1 tablet (50 mg total) by mouth daily. In am 02/08/21   McLean-Scocuzza, Pasty Spillers, MD  valACYclovir (VALTREX) 500 MG tablet Take 1 tablet (500 mg total) by mouth daily. Bid x 5 days prn 03/10/20   McLean-Scocuzza, Pasty Spillers, MD  venlafaxine XR (EFFEXOR XR) 150 MG 24 hr capsule Take 1 capsule (150 mg total) by mouth daily with breakfast. 01/04/21   Levert Feinstein, MD    Family History Family History   Problem Relation Age of Onset   Alcohol abuse Maternal Grandmother    Cancer Maternal Grandmother        Ovarian   Hypertension Maternal Grandmother    Diabetes Maternal Grandmother    Hypertension Mother    Diabetes Mother    Colon cancer Other 72   Breast cancer Other        + FH   Aneurysm Maternal Grandfather    Alcohol abuse Maternal Grandfather    Other Father        unsure  of medical history    Social History Social History   Tobacco Use   Smoking status: Former    Packs/day: 0.50    Types: Cigarettes   Smokeless tobacco: Never   Tobacco comments:    stopped 11/2015  Vaping Use   Vaping Use: Never used  Substance Use Topics   Alcohol use: Yes    Comment: occasionally   Drug use: No     Allergies   Patient has no known allergies.   Review of Systems Review of Systems   Physical Exam Triage Vital Signs ED Triage Vitals  Enc Vitals Group     BP 06/28/21 1540 (!) 154/87     Pulse Rate 06/28/21 1540 84     Resp 06/28/21 1540 20     Temp 06/28/21 1540 98.2 F (36.8 C)     Temp src --      SpO2 06/28/21 1540 98 %     Weight --      Height --      Head Circumference --      Peak Flow --      Pain Score 06/28/21 1541 4     Pain Loc --      Pain Edu? --      Excl. in [place]? --    No data found.  Updated Vital Signs BP (!) 154/87   Pulse 84   Temp 98.2 F (36.8 C)   Resp 20   SpO2 98%   Visual Acuity Right Eye Distance:   Left Eye Distance:   Bilateral Distance:    Right Eye Near:   Left Eye Near:    Bilateral Near:     Physical Exam General appearance: alert, well developed, well nourished, cooperative  Head: Normocephalic, without obvious abnormality, atraumatic Respiratory: Respirations even and unlabored, normal respiratory rate Heart: Rate and rhythm normal. No gallop or murmurs noted on exam  Abdomen: negative for CVA tenderness  Skin: Skin color, texture, turgor normal. No rashes seen  Psych: Appropriate mood and  affect. Vaginal cytology self collected  UC Treatments / Results  Labs (all labs ordered are listed, but only abnormal results are displayed) Labs Reviewed  POCT URINALYSIS DIP (MANUAL ENTRY) - Abnormal; Notable for the following components:      Result Value   Clarity, UA cloudy (*)    Nitrite, UA Positive (*)    Leukocytes, UA Trace (*)    All other components within normal limits  URINE CULTURE  CERVICOVAGINAL ANCILLARY ONLY    EKG   Radiology No results found.  Procedures Procedures (including critical care time)  Medications Ordered in UC Medications - No data to display  Initial Impression / Assessment and Plan / UC Course  I have reviewed the triage vital signs and the nursing notes.  Pertinent labs & imaging results that were available during my care of the patient were reviewed by me and considered in my medical decision making (see chart for details).      UA abnormal and findings consistent with UTI. Empiric antibiotic treatment initiated. Encouraged increase intake of water. Recommended cranberry tablets, wiping from front to back, and urinating prior to and after cortis to reduce risk for reinfection. Urine culture pending.  For vaginitis treatment with metronidazole and Diflucan. ER if symptoms become severe. Follow-up with PCP if symptoms do not completely resolve.  Final Clinical Impressions(s) / UC Diagnoses   Final diagnoses:  Acute UTI (urinary tract infection)  Vaginitis and vulvovaginitis  Discharge Instructions      Take Macrobid 100 mg twice daily for 5 days for treatment of your urinary tract infection.  Drink plenty of fluids.  Today take 1 dose of the Diflucan to prevent developing a antibiotic associated yeast infection.  After completing Macrobid start metronidazole tablets twice a day for 7 days for treatment of vaginitis likely related to BV.  Following treatment of BV take second dose of Diflucan to prevent yeast associated with  antibiotic treatment. Urine culture is pending.  If any changes in treatment is warranted after receipt of your lab test we will notify you by phone.   ED Prescriptions     Medication Sig Dispense Auth. Provider   metroNIDAZOLE (FLAGYL) 500 MG tablet Take 1 tablet (500 mg total) by mouth 2 (two) times daily with a meal. DO NOT CONSUME ALCOHOL WHILE TAKING THIS MEDICATION. 14 tablet Bing Neighbors, FNP   nitrofurantoin, macrocrystal-monohydrate, (MACROBID) 100 MG capsule Take 1 capsule (100 mg total) by mouth 2 (two) times daily. 10 capsule Bing Neighbors, FNP   fluconazole (DIFLUCAN) 150 MG tablet Take 1 tablet (150 mg total) by mouth every 3 (three) days. Repeat if needed 2 tablet Bing Neighbors, FNP      PDMP not reviewed this encounter.   Bing Neighbors, FNP 07/03/21 [phone]

## 2021-06-30 LAB — URINE CULTURE
Culture: >=100000 — AB
Special Requests: NORMAL

## 2021-07-01 LAB — CERVICOVAGINAL ANCILLARY ONLY
Bacterial Vaginitis (gardnerella): POSITIVE — AB
Candida Glabrata: NEGATIVE
Candida Vaginitis: NEGATIVE
Chlamydia: NEGATIVE
Comment: NEGATIVE
Comment: NEGATIVE
Comment: NEGATIVE
Comment: NEGATIVE
Comment: NEGATIVE
Comment: NORMAL
Neisseria Gonorrhea: NEGATIVE
Trichomonas: NEGATIVE

## 2021-07-02 ENCOUNTER — Other Ambulatory Visit: Payer: Self-pay | Admitting: Internal Medicine

## 2021-07-02 DIAGNOSIS — E1165 Type 2 diabetes mellitus with hyperglycemia: Secondary | ICD-10-CM

## 2021-07-07 ENCOUNTER — Telehealth: Payer: Managed Care, Other (non HMO) | Admitting: Neurology

## 2021-07-07 NOTE — Progress Notes (Deleted)
? ?  Virtual Visit via Video Note ? ?I connected with Nicole Mendez on 07/07/21 at  3:45 PM EDT by a video enabled telemedicine application and verified that I am speaking with the correct person using two identifiers. ? ?Location: ?Patient: *** ?Provider: *** ?  ?I discussed the limitations of evaluation and management by telemedicine and the availability of in person appointments. The patient expressed understanding and agreed to proceed. ? ?History of Present Illness: ?Nicole Mendez is a 46 year old female, seen in request by Dr. Bevelyn Buckles for evaluation of frequent headaches, initial evaluation was on May 14, 2020 ?  ?I reviewed and summarized the referring note.  Medical history ?Hypertension ?Hyperlipidemia ?DM ?  ?Patient had long history of migraine headaches, used to have it two times each months, but increase the headaches since 2020, now she has migraine headache 2-3 times a week, lateralized retro-orbital area headache with light noise sensitivity, throbbing, nauseous lasting for hours, she has been taking frequent Excedrin migraine with only temporary improvement ?  ?I personally reviewed CT head without contrast May 2017 that was normal ?  ?Laboratory evaluation seen 2021, negative HIV, RPR, vitamin D 28.4, A1c 6.7, normal TSH, lipid profile LDL 73, hemoglobin 10.3,  CMP creatinine of 0.61,  ?  ?UPDATE Jan 04 2021: ?She is taking Effexor XR 75 mg every day, which has helped her, tolerating the medication well, no significant side effect, it has helped her migraine, average 2-3 migraine each week, is no longer taking frequent over-the-counter medication ? ?She tried Maxalt 50 mg as needed, is helpful 50% of the time, oftentimes she has to take second dose, without complete elimination of her headache ?  ?Update April, 5 2023 SS:  ?  ?Observations/Objective: ? ? ?Assessment and Plan: ?1.  Chronic migraine headache ?2.  Analgesic rebound headache ? ?Follow Up  Instructions: ? ?  ?I discussed the assessment and treatment plan with the patient. The patient was provided an opportunity to ask questions and all were answered. The patient agreed with the plan and demonstrated an understanding of the instructions. ?  ?The patient was advised to call back or seek an in-person evaluation if the symptoms worsen or if the condition fails to improve as anticipated. ? ? ? ?Margie Ege, AGNP-C, DNP  ?[facility] ?[address], [address] ?[place], [place] [address] ?[phone] ? ? ?

## 2021-07-08 ENCOUNTER — Encounter: Payer: Self-pay | Admitting: Internal Medicine

## 2021-07-22 ENCOUNTER — Other Ambulatory Visit: Payer: Self-pay | Admitting: Family Medicine

## 2021-07-26 ENCOUNTER — Other Ambulatory Visit: Payer: Self-pay | Admitting: Internal Medicine

## 2021-07-26 DIAGNOSIS — B009 Herpesviral infection, unspecified: Secondary | ICD-10-CM

## 2021-08-09 ENCOUNTER — Other Ambulatory Visit: Payer: Self-pay | Admitting: Internal Medicine

## 2021-08-09 ENCOUNTER — Encounter: Payer: Self-pay | Admitting: Internal Medicine

## 2021-08-09 DIAGNOSIS — B9689 Other specified bacterial agents as the cause of diseases classified elsewhere: Secondary | ICD-10-CM

## 2021-08-09 MED ORDER — METRONIDAZOLE 500 MG PO TABS: 500.0000 mg | ORAL_TABLET | Freq: Two times a day (BID) | ORAL | 2 refills | Status: DC

## 2021-08-17 ENCOUNTER — Other Ambulatory Visit: Payer: Self-pay | Admitting: Dermatology

## 2021-08-17 DIAGNOSIS — L409 Psoriasis, unspecified: Secondary | ICD-10-CM

## 2021-09-03 ENCOUNTER — Encounter: Payer: Self-pay | Admitting: Internal Medicine

## 2021-09-03 ENCOUNTER — Other Ambulatory Visit: Payer: Self-pay

## 2021-09-03 DIAGNOSIS — M5412 Radiculopathy, cervical region: Secondary | ICD-10-CM

## 2021-09-03 DIAGNOSIS — M25512 Pain in left shoulder: Secondary | ICD-10-CM

## 2021-09-03 MED ORDER — CYCLOBENZAPRINE HCL 5 MG PO TABS: 5.0000 mg | ORAL_TABLET | Freq: Every day | ORAL | 0 refills | Status: DC | PRN

## 2021-10-08 ENCOUNTER — Encounter: Payer: Self-pay | Admitting: Internal Medicine

## 2021-10-08 ENCOUNTER — Ambulatory Visit: Payer: Managed Care, Other (non HMO) | Admitting: Internal Medicine

## 2021-10-08 VITALS — BP 122/70 | HR 97 | Temp 98.5°F | Resp 14 | Ht 64.0 in | Wt 244.4 lb

## 2021-10-08 DIAGNOSIS — G4733 Obstructive sleep apnea (adult) (pediatric): Secondary | ICD-10-CM

## 2021-10-08 DIAGNOSIS — R928 Other abnormal and inconclusive findings on diagnostic imaging of breast: Secondary | ICD-10-CM

## 2021-10-08 DIAGNOSIS — M25512 Pain in left shoulder: Secondary | ICD-10-CM

## 2021-10-08 DIAGNOSIS — Z6841 Body Mass Index (BMI) 40.0 and over, adult: Secondary | ICD-10-CM

## 2021-10-08 DIAGNOSIS — B009 Herpesviral infection, unspecified: Secondary | ICD-10-CM

## 2021-10-08 DIAGNOSIS — E119 Type 2 diabetes mellitus without complications: Secondary | ICD-10-CM

## 2021-10-08 DIAGNOSIS — Z111 Encounter for screening for respiratory tuberculosis: Secondary | ICD-10-CM | POA: Diagnosis not present

## 2021-10-08 DIAGNOSIS — I10 Essential (primary) hypertension: Secondary | ICD-10-CM

## 2021-10-08 DIAGNOSIS — E538 Deficiency of other specified B group vitamins: Secondary | ICD-10-CM

## 2021-10-08 DIAGNOSIS — J40 Bronchitis, not specified as acute or chronic: Secondary | ICD-10-CM

## 2021-10-08 DIAGNOSIS — N760 Acute vaginitis: Secondary | ICD-10-CM

## 2021-10-08 DIAGNOSIS — E785 Hyperlipidemia, unspecified: Secondary | ICD-10-CM

## 2021-10-08 DIAGNOSIS — E1159 Type 2 diabetes mellitus with other circulatory complications: Secondary | ICD-10-CM

## 2021-10-08 DIAGNOSIS — L409 Psoriasis, unspecified: Secondary | ICD-10-CM

## 2021-10-08 DIAGNOSIS — Z9989 Dependence on other enabling machines and devices: Secondary | ICD-10-CM

## 2021-10-08 DIAGNOSIS — Z1211 Encounter for screening for malignant neoplasm of colon: Secondary | ICD-10-CM

## 2021-10-08 DIAGNOSIS — Z23 Encounter for immunization: Secondary | ICD-10-CM | POA: Diagnosis not present

## 2021-10-08 DIAGNOSIS — E559 Vitamin D deficiency, unspecified: Secondary | ICD-10-CM

## 2021-10-08 DIAGNOSIS — E1165 Type 2 diabetes mellitus with hyperglycemia: Secondary | ICD-10-CM

## 2021-10-08 DIAGNOSIS — D509 Iron deficiency anemia, unspecified: Secondary | ICD-10-CM

## 2021-10-08 DIAGNOSIS — M5412 Radiculopathy, cervical region: Secondary | ICD-10-CM

## 2021-10-08 DIAGNOSIS — E66813 Obesity, class 3: Secondary | ICD-10-CM

## 2021-10-08 DIAGNOSIS — G47429 Narcolepsy in conditions classified elsewhere without cataplexy: Secondary | ICD-10-CM

## 2021-10-08 DIAGNOSIS — I152 Hypertension secondary to endocrine disorders: Secondary | ICD-10-CM

## 2021-10-08 DIAGNOSIS — Z1329 Encounter for screening for other suspected endocrine disorder: Secondary | ICD-10-CM

## 2021-10-08 DIAGNOSIS — K921 Melena: Secondary | ICD-10-CM

## 2021-10-08 DIAGNOSIS — A530 Latent syphilis, unspecified as early or late: Secondary | ICD-10-CM

## 2021-10-08 DIAGNOSIS — Z1231 Encounter for screening mammogram for malignant neoplasm of breast: Secondary | ICD-10-CM

## 2021-10-08 DIAGNOSIS — B9689 Other specified bacterial agents as the cause of diseases classified elsewhere: Secondary | ICD-10-CM

## 2021-10-08 NOTE — Progress Notes (Addendum)
Chief Complaint  Patient presents with   TB testing    Ok with blood draw, will wait for flu shot until Aug-Sept. In need of [facility] biometric screening   Medication Refill   F/u  1. Obesity with wt loss trying  2. Htn controlled today on meds lis 40 mg qd and spironlactone 50 mg qd toprol 25 mg xl qd 3. H/o DM 2 declines GLP 1 meds due to c/w side effects on metformin 500 mg bidm pravachol 40 mg qhs and above meds 4. Needs TB screening for 1 of 3 jobs and labs for biometrics screening for [place] will get other labs at outside [place] 5. RPR + 09/2020 in the ED given iv PCN and also +03/2021 will refer to ID  6. Repeat left mammogram diagnostic US due for f/u +fat necrosis   Review of Systems  Constitutional:  Negative for weight loss.  HENT:  Negative for hearing loss.   Eyes:  Negative for blurred vision.  Respiratory:  Negative for shortness of breath.   Cardiovascular:  Negative for chest pain.  Gastrointestinal:  Negative for abdominal pain and blood in stool.  Genitourinary:  Negative for dysuria.  Musculoskeletal:  Negative for falls and joint pain.  Skin:  Negative for rash.  Neurological:  Negative for headaches.  Psychiatric/Behavioral:  Negative for depression.    Past Medical History:  Diagnosis Date   Alcohol abuse    Arthritis    Cervical radiculopathy    COVID-19    Diabetes mellitus without complication ([place]) 4166   Headache    Herpes    1 and 2 since 46 y.o    Hyperlipidemia    Hypertension    Morbid obesity ([place])    OSA (obstructive sleep apnea)    Not on CPAP   Psoriasis    Tobacco abuse    Past Surgical History:  Procedure Laterality Date   No past surgery.     Family History  Problem Relation Age of Onset   Alcohol abuse Maternal Grandmother    Cancer Maternal Grandmother        Ovarian   Hypertension Maternal Grandmother    Diabetes Maternal Grandmother    Hypertension Mother    Diabetes Mother    Colon cancer Other 73   Breast cancer  Other        + FH   Aneurysm Maternal Grandfather    Alcohol abuse Maternal Grandfather    Other Father        unsure of medical history   Social History   Socioeconomic History   Marital status: Single    Spouse name: Not on file   Number of children: 0   Years of education: college   Highest education level: Associate degree: academic program  Occupational History   Occupation: customer service  Tobacco Use   Smoking status: Former    Packs/day: 0.50    Types: Cigarettes   Smokeless tobacco: Never   Tobacco comments:    stopped 11/2015  Vaping Use   Vaping Use: Never used  Substance and Sexual Activity   Alcohol use: Yes    Comment: occasionally   Drug use: No   Sexual activity: Yes    Partners: Male    Birth control/protection: None    Comment: 1 partner  Other Topics Concern   Not on file  Social History Narrative   Engaged as of 09/2021 to fiance   No kids   Right-handed.   No daily caffeine use.  Social Determinants of Health   Financial Resource Strain: Not on file  Food Insecurity: Not on file  Transportation Needs: Not on file  Physical Activity: Not on file  Stress: Not on file  Social Connections: Not on file  Intimate Partner Violence: Not on file   Current Meds  Medication Sig   acetaminophen (TYLENOL) 650 MG CR tablet Take 650 mg by mouth every 8 (eight) hours as needed for pain.    aspirin 81 MG tablet Take 81 mg by mouth daily.   Risankizumab-rzaa (SKYRIZI) 150 MG/ML SOSY Inject 150 mg into the skin as directed. Every 12 weeks for maintenance.   [DISCONTINUED] albuterol (VENTOLIN HFA) 108 (90 Base) MCG/ACT inhaler Inhale 1-2 puffs into the lungs every 4 (four) hours as needed for wheezing or shortness of breath.   [DISCONTINUED] amLODipine (NORVASC) 10 MG tablet Take 1 tablet (10 mg total) by mouth daily.   [DISCONTINUED] Cholecalciferol 1.25 MG (50000 UT) capsule Take 1 capsule (50,000 Units total) by mouth once a week.   [DISCONTINUED]  cyclobenzaprine (FLEXERIL) 5 MG tablet Take 1 tablet (5 mg total) by mouth daily as needed for muscle spasms.   [DISCONTINUED] ketoconazole (NIZORAL) 2 % shampoo Apply 1 application topically once a week. Shampoo scalp weekly, let sit 5 minutes and rinse out   [DISCONTINUED] lisinopril (ZESTRIL) 40 MG tablet Take 1 tablet (40 mg total) by mouth daily.   [DISCONTINUED] metFORMIN (GLUCOPHAGE) 500 MG tablet TAKE 1 TABLET BY MOUTH  TWICE DAILY WITH FOOD   [DISCONTINUED] metoprolol succinate (TOPROL-XL) 25 MG 24 hr tablet Take 1 tablet (25 mg total) by mouth daily. At night   [DISCONTINUED] metroNIDAZOLE (FLAGYL) 500 MG tablet Take 1 tablet (500 mg total) by mouth 2 (two) times daily with a meal. DO NOT CONSUME ALCOHOL WHILE TAKING THIS MEDICATION.   [DISCONTINUED] nitrofurantoin, macrocrystal-monohydrate, (MACROBID) 100 MG capsule Take 1 capsule (100 mg total) by mouth 2 (two) times daily.   [DISCONTINUED] ondansetron (ZOFRAN ODT) 4 MG disintegrating tablet Take 1 tablet (4 mg total) by mouth every 8 (eight) hours as needed for nausea or vomiting.   [DISCONTINUED] pravastatin (PRAVACHOL) 40 MG tablet Take 1 tablet (40 mg total) by mouth daily.   [DISCONTINUED] rizatriptan (MAXALT-MLT) 10 MG disintegrating tablet Take 1 tablet (10 mg total) by mouth as needed for migraine. May repeat in 2 hours if needed   [DISCONTINUED] spironolactone (ALDACTONE) 50 MG tablet Take 1 tablet (50 mg total) by mouth daily. In am   [DISCONTINUED] valACYclovir (VALTREX) 500 MG tablet TAKE 1 TABLET BY MOUTH TWICE DAILY FOR 5 DAYS AS NEEDED   [DISCONTINUED] venlafaxine XR (EFFEXOR XR) 150 MG 24 hr capsule Take 1 capsule (150 mg total) by mouth daily with breakfast.   No Known Allergies No results found for this or any previous visit (from the past 2160 hour(s)). Objective  Body mass index is 41.95 kg/m. Wt Readings from Last 3 Encounters:  10/08/21 244 lb 6.4 oz (110.9 kg)  02/04/21 267 lb 3.2 oz (121.2 kg)  01/04/21 257  lb 8 oz (116.8 kg)   Temp Readings from Last 3 Encounters:  10/08/21 98.5 F (36.9 C) (Oral)  06/28/21 98.2 F (36.8 C)  03/16/21 98.1 F (36.7 C) (Oral)   BP Readings from Last 3 Encounters:  10/08/21 122/70  06/28/21 (!) 154/87  03/16/21 (!) 158/91   Pulse Readings from Last 3 Encounters:  10/08/21 97  06/28/21 84  03/16/21 76    Physical Exam Vitals and nursing note reviewed.  Constitutional:      Appearance: Normal appearance. She is well-developed and well-groomed.  HENT:     Head: Normocephalic and atraumatic.  Eyes:     Conjunctiva/sclera: Conjunctivae normal.     Pupils: Pupils are equal, round, and reactive to light.  Cardiovascular:     Rate and Rhythm: Normal rate and regular rhythm.     Heart sounds: Normal heart sounds. No murmur heard. Pulmonary:     Effort: Pulmonary effort is normal.     Breath sounds: Normal breath sounds.  Abdominal:     General: Abdomen is flat. Bowel sounds are normal.     Tenderness: There is no abdominal tenderness.  Musculoskeletal:        General: No tenderness.  Skin:    General: Skin is warm and dry.  Neurological:     General: No focal deficit present.     Mental Status: She is alert and oriented to person, place, and time. Mental status is at baseline.     Cranial Nerves: Cranial nerves 2-12 are intact.     Motor: Motor function is intact.     Coordination: Coordination is intact.     Gait: Gait is intact.  Psychiatric:        Attention and Perception: Attention and perception normal.        Mood and Affect: Mood and affect normal.        Speech: Speech normal.        Behavior: Behavior normal. Behavior is cooperative.        Thought Content: Thought content normal.        Cognition and Memory: Cognition and memory normal.        Judgment: Judgment normal.     Assessment  Plan  Hypertension associated with diabetes ([place]) - Plan: Comprehensive metabolic panel, Lipid panel, CBC with Differential/Platelet,  Urinalysis, Routine w reflex microscopic, Microalbumin / creatinine urine ratio, Hemoglobin A1c on meds lis 40 mg qd and spironlactone 50 mg qd toprol 25 mg xl qd 3. H/o DM 2 declines GLP 1 meds due to c/w side effects on metformin 500 mg bidm pravachol 40 mg qhs and above meds ROI patty vision today  Screening-pulmonary TB - Plan: QuantiFERON-TB Gold Plus  Hyperlipidemia, unspecified hyperlipidemia type - Plan: pravastatin (PRAVACHOL) 40 MG tablet  Blood in stool - Plan: Ambulatory referral to Gastroenterology  Screening for colon cancer - Plan: Ambulatory referral to Gastroenterology  Abnormal mammogram of left breast - Plan: MM DIAG BREAST TOMO UNI LEFT, US BREAST LTD UNI LEFT INC AXILLA  Iron deficiency anemia, unspecified iron deficiency anemia type - Plan: CBC with Differential/Platelet, Iron, TIBC and Ferritin Panel  Vitamin D deficiency - Plan: Vitamin D (25 hydroxy), Cholecalciferol 1.25 MG (50000 UT) capsule  B12 deficiency - Plan: B12  BMI 40.0-44.9, adult (HCC) Class 3 severe obesity due to excess calories with serious comorbidity and body mass index (BMI) of 40.0 to 44.9 in adult [facility]) Will try lifestyle changes  Declines GLP 1 meds due to side effects   H/o Bronchitis - Plan: albuterol (VENTOLIN HFA) 108 (90 Base) MCG/ACT inhaler  Cervical radiculopathy - Plan: cyclobenzaprine (FLEXERIL) 5 MG tablet Acute pain of left shoulder - Plan: cyclobenzaprine (FLEXERIL) 5 MG tablet  Psoriasis - Plan: ketoconazole (NIZORAL) 2 % shampoo Ok skyrizi   Bacterial vaginosis - Plan: metroNIDAZOLE (FLAGYL) 500 MG tablet  Herpes infection - Plan: valACYclovir (VALTREX) 1000 MG tablet  Positive RPR test - Plan: Ambulatory referral to Infectious Disease  Tx'ed pcn 09/2020 in the ED and + also 03/2021 in the ED   C/w narcolepsy and OSA  Refer [facility]   HM Flu utd  Tdap given 10/08/21 pna 23 had 08/16/2018  consider prevnar 20 in the future  covid 2/2 moderna consider booster 3rd  dose  Hep B immune Hep C negative 08/17/20   mammo 03/04/19 with bx negative 03/08/19 left breast clip Mammo 02/16/21 left breast abnormal due dx mammo and [place] in 3 months ordered for asap as of 10/08/21 -screening right breast due 02/16/22    Pap 02/04/21 negative neg hpv   H/o +RPR 09/2020 and 03/2021 in the ED referred to ID as of 10/11/21   Colonoscopy +blood in stool 44 referred no  FH colon cancer  Referred [facility]  rec healthy diet and exercise    rec MMR vx health dept   Rec healthy diet and exercise   Eye [facility] ROI today   Provider: Dr. Olivia Mackie McLean-Scocuzza-Internal Medicine

## 2021-10-08 NOTE — Patient Instructions (Addendum)
[place] left breast mammogram diagnostic + [place] now  February 16, 2022 screening mammogram  [address]  [phone]   Call and schedule screening mammogram 02/16/22 and diagnostic now   [facility] GI colonoscopy  MD Physician   Primary Contact Information  Phone Fax E-mail Address  [phone] Not available [address]   [place] [address]     Specialties     Gastroenterology       Tdap (Tetanus, Diphtheria, Pertussis) Vaccine: What You Need to Know 1. Why get vaccinated? Tdap vaccine can prevent tetanus, diphtheria, and pertussis. Diphtheria and pertussis spread from person to person. Tetanus enters the body through cuts or wounds. TETANUS (T) causes painful stiffening of the muscles. Tetanus can lead to serious health problems, including being unable to open the mouth, having trouble swallowing and breathing, or death. DIPHTHERIA (D) can lead to difficulty breathing, heart failure, paralysis, or death. PERTUSSIS (aP), also known as "whooping cough," can cause uncontrollable, violent coughing that makes it hard to breathe, eat, or drink. Pertussis can be extremely serious especially in babies and young children, causing pneumonia, convulsions, brain damage, or death. In teens and adults, it can cause weight loss, loss of bladder control, passing out, and rib fractures from severe coughing. 2. Tdap vaccine Tdap is only for children 7 years and older, adolescents, and adults.  Adolescents should receive a single dose of Tdap, preferably at age 59 or 12 years. Pregnant people should get a dose of Tdap during every pregnancy, preferably during the early part of the third trimester, to help protect the newborn from pertussis. Infants are most at risk for severe, life-threatening complications from pertussis. Adults who have never received Tdap should get a dose of Tdap. Also, adults should receive a booster dose of either Tdap or Td (a different vaccine that  protects against tetanus and diphtheria but not pertussis) every 10 years, or after 5 years in the case of a severe or dirty wound or burn. Tdap may be given at the same time as other vaccines. 3. Talk with your health care provider Tell your vaccine provider if the person getting the vaccine: Has had an allergic reaction after a previous dose of any vaccine that protects against tetanus, diphtheria, or pertussis, or has any severe, life-threatening allergies Has had a coma, decreased level of consciousness, or prolonged seizures within 7 days after a previous dose of any pertussis vaccine (DTP, DTaP, or Tdap) Has seizures or another nervous system problem Has ever had Guillain-Barr Syndrome (also called "GBS") Has had severe pain or swelling after a previous dose of any vaccine that protects against tetanus or diphtheria In some cases, your health care provider may decide to postpone Tdap vaccination until a future visit. People with minor illnesses, such as a cold, may be vaccinated. People who are moderately or severely ill should usually wait until they recover before getting Tdap vaccine.  Your health care provider can give you more information. 4. Risks of a vaccine reaction Pain, redness, or swelling where the shot was given, mild fever, headache, feeling tired, and nausea, vomiting, diarrhea, or stomachache sometimes happen after Tdap vaccination. People sometimes faint after medical procedures, including vaccination. Tell your provider if you feel dizzy or have vision changes or ringing in the ears.  As with any medicine, there is a very remote chance of a vaccine causing a severe allergic reaction, other serious injury, or death. 5. What if there is a serious problem?  An allergic reaction could occur after the vaccinated person leaves the clinic. If you see signs of a severe allergic reaction (hives, swelling of the face and throat, difficulty breathing, a fast heartbeat, dizziness, or  weakness), call 9-1-1 and get the person to the nearest hospital. For other signs that concern you, call your health care provider.  Adverse reactions should be reported to the Vaccine Adverse Event Reporting System (VAERS). Your health care provider will usually file this report, or you can do it yourself. Visit the VAERS website at www.vaers.LAgents.no or call 669-656-3733. VAERS is only for reporting reactions, and VAERS staff members do not give medical advice. 6. The National Vaccine Injury Compensation Program The [employer] Vaccine Injury Compensation Program (VICP) is a federal program that was created to compensate people who may have been injured by certain vaccines. Claims regarding alleged injury or death due to vaccination have a time limit for filing, which may be as short as two years. Visit the VICP website at SpiritualWord.at or call (667)294-9847 to learn about the program and about filing a claim. 7. How can I learn more? Ask your health care provider. Call your local or state health department. Visit the website of the Food and Drug Administration (FDA) for vaccine package inserts and additional information at FinderList.no. Contact the Centers for Disease Control and Prevention (CDC): Call 760-042-9529 (1-800-CDC-INFO) or Visit CDC's website at PicCapture.uy. Source: CDC Vaccine Information Statement Tdap (Tetanus, Diphtheria, Pertussis) Vaccine (11/08/2019) This same material is available at FootballExhibition.com.br for no charge. This information is not intended to replace advice given to you by your health care provider. Make sure you discuss any questions you have with your health care provider. Document Revised: 02/17/2021 Document Reviewed: 12/21/2020 Elsevier Patient Education  2023 [employer].

## 2021-10-11 ENCOUNTER — Encounter: Payer: Self-pay | Admitting: Internal Medicine

## 2021-10-11 ENCOUNTER — Telehealth: Payer: Self-pay | Admitting: Internal Medicine

## 2021-10-11 DIAGNOSIS — A530 Latent syphilis, unspecified as early or late: Secondary | ICD-10-CM | POA: Insufficient documentation

## 2021-10-11 DIAGNOSIS — K921 Melena: Secondary | ICD-10-CM | POA: Insufficient documentation

## 2021-10-11 MED ORDER — PRAVASTATIN SODIUM 40 MG PO TABS: 40.0000 mg | ORAL_TABLET | Freq: Every day | ORAL | 3 refills | Status: AC

## 2021-10-11 MED ORDER — METFORMIN HCL 500 MG PO TABS: 500.0000 mg | ORAL_TABLET | Freq: Two times a day (BID) | ORAL | 3 refills | Status: AC

## 2021-10-11 MED ORDER — LISINOPRIL 40 MG PO TABS: 40.0000 mg | ORAL_TABLET | Freq: Every day | ORAL | 3 refills | Status: AC

## 2021-10-11 MED ORDER — CHOLECALCIFEROL 1.25 MG (50000 UT) PO CAPS: 50000.0000 [IU] | ORAL_CAPSULE | ORAL | 1 refills | Status: DC

## 2021-10-11 MED ORDER — VALACYCLOVIR HCL 1 G PO TABS: ORAL_TABLET | ORAL | 3 refills | Status: AC

## 2021-10-11 MED ORDER — SPIRONOLACTONE 50 MG PO TABS: 50.0000 mg | ORAL_TABLET | Freq: Every day | ORAL | 3 refills | Status: AC

## 2021-10-11 MED ORDER — ONDANSETRON 4 MG PO TBDP: 4.0000 mg | ORAL_TABLET | Freq: Three times a day (TID) | ORAL | 11 refills | Status: AC | PRN

## 2021-10-11 MED ORDER — CYCLOBENZAPRINE HCL 5 MG PO TABS: 5.0000 mg | ORAL_TABLET | Freq: Every day | ORAL | 5 refills | Status: AC | PRN

## 2021-10-11 MED ORDER — METRONIDAZOLE 500 MG PO TABS: 500.0000 mg | ORAL_TABLET | Freq: Two times a day (BID) | ORAL | 2 refills | Status: DC

## 2021-10-11 MED ORDER — METOPROLOL SUCCINATE ER 25 MG PO TB24: 25.0000 mg | ORAL_TABLET | Freq: Every day | ORAL | 3 refills | Status: AC

## 2021-10-11 MED ORDER — KETOCONAZOLE 2 % EX SHAM: 1.0000 | MEDICATED_SHAMPOO | CUTANEOUS | 3 refills | Status: DC

## 2021-10-11 MED ORDER — RIZATRIPTAN BENZOATE 10 MG PO TBDP: 10.0000 mg | ORAL_TABLET | ORAL | 11 refills | Status: AC | PRN

## 2021-10-11 MED ORDER — VENLAFAXINE HCL ER 150 MG PO CP24
150.0000 mg | ORAL_CAPSULE | Freq: Every day | ORAL | 3 refills | Status: AC
Start: 2021-10-11 — End: ?

## 2021-10-11 MED ORDER — AMLODIPINE BESYLATE 10 MG PO TABS: 10.0000 mg | ORAL_TABLET | Freq: Every day | ORAL | 3 refills | Status: AC

## 2021-10-11 MED ORDER — ALBUTEROL SULFATE HFA 108 (90 BASE) MCG/ACT IN AERS: 1.0000 | INHALATION_SPRAY | RESPIRATORY_TRACT | 11 refills | Status: AC | PRN

## 2021-10-11 NOTE — Progress Notes (Signed)
Called pt to inform her that after chart review she was seen at [facility] on 12/22 for STD testing & results came back + for RPR and we need to refer her to infectious disease to ensure this is a false + or true +. Tried calling pt with # on file but # no longer in service, also called work phone but they stated her phone # is not listed on file. I did go ahead an sent fax to [facility] and report + RPR. I see you sent mychart message & we will wait until her response.

## 2021-10-11 NOTE — Addendum Note (Signed)
Addended by: Essie Hart C on: 10/11/2021 11:24 AM   Modules accepted: Orders

## 2021-10-11 NOTE — Progress Notes (Signed)
Tdap logged

## 2021-10-11 NOTE — Telephone Encounter (Signed)
Lft pt vm to call ofc . thanks 

## 2021-10-12 ENCOUNTER — Encounter: Payer: Self-pay | Admitting: Internal Medicine

## 2021-10-12 ENCOUNTER — Telehealth: Payer: Self-pay

## 2021-10-12 LAB — QUANTIFERON-TB GOLD PLUS
QuantiFERON Mitogen Value: >10 IU/mL
QuantiFERON Nil Value: 0.03 IU/mL
QuantiFERON TB1 Ag Value: 0.07 IU/mL
QuantiFERON TB2 Ag Value: 0.04 IU/mL
QuantiFERON-TB Gold Plus: NEGATIVE

## 2021-10-12 NOTE — Telephone Encounter (Signed)
Lvm to inform pt that TB results were negative  Mailed copy of results to pt

## 2021-10-19 ENCOUNTER — Encounter: Payer: Self-pay | Admitting: Internal Medicine

## 2021-10-21 ENCOUNTER — Telehealth: Payer: Self-pay

## 2021-10-21 NOTE — Telephone Encounter (Signed)
Patient returned call to set up appointment per referral. Requests call back.   Routed to referral coordinator.

## 2021-10-26 ENCOUNTER — Ambulatory Visit: Payer: Managed Care, Other (non HMO) | Admitting: Infectious Diseases

## 2021-10-26 NOTE — Progress Notes (Unsigned)
Patient: Nicole Mendez Date of Birth: 1975/08/18  Reason for Visit: Follow up History from: Patient Primary Neurologist: Dr. Terrace Arabia    ASSESSMENT AND PLAN 46 y.o. year old female   1.  Chronic migraine headache 2.  Analgesic rebound headache   HISTORY  Nicole Mendez is a 46 year old female, seen in request by Dr. Bevelyn Buckles for evaluation of frequent headaches, initial evaluation was on May 14, 2020   I reviewed and summarized the referring note.  Medical history Hypertension Hyperlipidemia DM   Patient had long history of migraine headaches, used to have it two times each months, but increase the headaches since 2020, now she has migraine headache 2-3 times a week, lateralized retro-orbital area headache with light noise sensitivity, throbbing, nauseous lasting for hours, she has been taking frequent Excedrin migraine with only temporary improvement   I personally reviewed CT head without contrast May 2017 that was normal   Laboratory evaluation seen 2021, negative HIV, RPR, vitamin D 28.4, A1c 6.7, normal TSH, lipid profile LDL 73, hemoglobin 10.3,  CMP creatinine of 0.61,    UPDATE Jan 04 2021: She is taking Effexor XR 75 mg every day, which has helped her, tolerating the medication well, no significant side effect, it has helped her migraine, average 2-3 migraine each week, is no longer taking frequent over-the-counter medication  She tried Maxalt 50 mg as needed, is helpful 50% of the time, oftentimes she has to take second dose, without complete elimination of her headache  Update 10/27/21 SS:   REVIEW OF SYSTEMS: Out of a complete 14 system review of symptoms, the patient complains only of the following symptoms, and all other reviewed systems are negative.  See HPI  ALLERGIES: No Known Allergies  HOME MEDICATIONS: Outpatient Medications Prior to Visit  Medication Sig Dispense Refill   acetaminophen (TYLENOL) 650 MG CR tablet Take  650 mg by mouth every 8 (eight) hours as needed for pain.      albuterol (VENTOLIN HFA) 108 (90 Base) MCG/ACT inhaler Inhale 1-2 puffs into the lungs every 4 (four) hours as needed for wheezing or shortness of breath. 18 g 11   amLODipine (NORVASC) 10 MG tablet Take 1 tablet (10 mg total) by mouth daily. 90 tablet 3   aspirin 81 MG tablet Take 81 mg by mouth daily.     Cholecalciferol 1.25 MG (50000 UT) capsule Take 1 capsule (50,000 Units total) by mouth once a week. 13 capsule 1   cyclobenzaprine (FLEXERIL) 5 MG tablet Take 1 tablet (5 mg total) by mouth daily as needed for muscle spasms. 30 tablet 5   ketoconazole (NIZORAL) 2 % shampoo Apply 1 Application topically once a week. Shampoo scalp weekly, let sit 5 minutes and rinse out 120 mL 3   lisinopril (ZESTRIL) 40 MG tablet Take 1 tablet (40 mg total) by mouth daily. 90 tablet 3   metFORMIN (GLUCOPHAGE) 500 MG tablet Take 1 tablet (500 mg total) by mouth 2 (two) times daily with a meal. 180 tablet 3   metoprolol succinate (TOPROL-XL) 25 MG 24 hr tablet Take 1 tablet (25 mg total) by mouth daily. At night 90 tablet 3   metroNIDAZOLE (FLAGYL) 500 MG tablet Take 1 tablet (500 mg total) by mouth 2 (two) times daily with a meal. DO NOT CONSUME ALCOHOL WHILE TAKING THIS MEDICATION. 14 tablet 2   ondansetron (ZOFRAN ODT) 4 MG disintegrating tablet Take 1 tablet (4 mg total) by mouth every 8 (eight) hours as  needed for nausea or vomiting. 30 tablet 11   pravastatin (PRAVACHOL) 40 MG tablet Take 1 tablet (40 mg total) by mouth daily. 90 tablet 3   Risankizumab-rzaa (SKYRIZI) 150 MG/ML SOSY Inject 150 mg into the skin as directed. Every 12 weeks for maintenance. 1 mL 1   rizatriptan (MAXALT-MLT) 10 MG disintegrating tablet Take 1 tablet (10 mg total) by mouth as needed for migraine. May repeat in 2 hours if needed 12 tablet 11   spironolactone (ALDACTONE) 50 MG tablet Take 1 tablet (50 mg total) by mouth daily. In am 90 tablet 3   valACYclovir (VALTREX)  1000 MG tablet TAKE 1 TABLET BY MOUTH TWICE DAILY FOR 5 DAYS AS NEEDED 90 tablet 3   venlafaxine XR (EFFEXOR XR) 150 MG 24 hr capsule Take 1 capsule (150 mg total) by mouth daily with breakfast. 90 capsule 3   No facility-administered medications prior to visit.    PAST MEDICAL HISTORY: Past Medical History:  Diagnosis Date   Alcohol abuse    Arthritis    Cervical radiculopathy    COVID-19    Diabetes mellitus without complication ([place]) [id number]   Headache    Herpes    1 and 2 since 46 y.o    Hyperlipidemia    Hypertension    Morbid obesity (HCC)    OSA (obstructive sleep apnea)    Not on CPAP   Psoriasis    Tobacco abuse     PAST SURGICAL HISTORY: Past Surgical History:  Procedure Laterality Date   No past surgery.      FAMILY HISTORY: Family History  Problem Relation Age of Onset   Alcohol abuse Maternal Grandmother    Cancer Maternal Grandmother        Ovarian   Hypertension Maternal Grandmother    Diabetes Maternal Grandmother    Hypertension Mother    Diabetes Mother    Colon cancer Other 87   Breast cancer Other        + FH   Aneurysm Maternal Grandfather    Alcohol abuse Maternal Grandfather    Other Father        unsure of medical history    SOCIAL HISTORY: Social History   Socioeconomic History   Marital status: Single    Spouse name: Not on file   Number of children: 0   Years of education: college   Highest education level: Associate degree: academic program  Occupational History   Occupation: customer service  Tobacco Use   Smoking status: Former    Packs/day: 0.50    Types: Cigarettes   Smokeless tobacco: Never   Tobacco comments:    stopped 11/2015  Vaping Use   Vaping Use: Never used  Substance and Sexual Activity   Alcohol use: Yes    Comment: occasionally   Drug use: No   Sexual activity: Yes    Partners: Male    Birth control/protection: None    Comment: 1 partner  Other Topics Concern   Not on file  Social History  Narrative   Engaged as of 09/2021 to fiance   No kids   Right-handed.   No daily caffeine use.   Social Determinants of Health   Financial Resource Strain: Not on file  Food Insecurity: Not on file  Transportation Needs: Not on file  Physical Activity: Not on file  Stress: Not on file  Social Connections: Not on file  Intimate Partner Violence: Not on file    PHYSICAL EXAM  There were no  vitals filed for this visit. There is no height or weight on file to calculate BMI.  Generalized: Well developed, in no acute distress  Neurological examination  Mentation: Alert oriented to time, place, history taking. Follows all commands speech and language fluent Cranial nerve II-XII: Pupils were equal round reactive to light. Extraocular movements were full, visual field were full on confrontational test. Facial sensation and strength were normal. Uvula tongue midline. Head turning and shoulder shrug  were normal and symmetric. Motor: The motor testing reveals 5 over 5 strength of all 4 extremities. Good symmetric motor tone is noted throughout.  Sensory: Sensory testing is intact to soft touch on all 4 extremities. No evidence of extinction is noted.  Coordination: Cerebellar testing reveals good finger-nose-finger and heel-to-shin bilaterally.  Gait and station: Gait is normal. Tandem gait is normal. Romberg is negative. No drift is seen.  Reflexes: Deep tendon reflexes are symmetric and normal bilaterally.   DIAGNOSTIC DATA (LABS, IMAGING, TESTING) - I reviewed patient records, labs, notes, testing and imaging myself where available.  Lab Results  Component Value Date   WBC 6.4 08/17/2020   HGB 9.8 (L) 08/17/2020   HCT 33.0 (L) 08/17/2020   MCV 77 (L) 08/17/2020   PLT 474 (H) 08/17/2020      Component Value Date/Time   NA 140 08/17/2020 0955   K 4.4 08/17/2020 0955   CL 102 08/17/2020 0955   CO2 26 08/17/2020 0955   GLUCOSE 112 (H) 08/17/2020 0955   GLUCOSE 113 (H) 08/25/2015  0531   BUN 9 08/17/2020 0955   CREATININE 0.61 08/17/2020 0955   CALCIUM 8.7 08/17/2020 0955   PROT 6.9 08/17/2020 0955   ALBUMIN 3.8 08/17/2020 0955   ALBUMIN 3,500 (L) 08/22/2015 1100   AST 14 08/17/2020 0955   ALT 11 08/17/2020 0955   ALKPHOS 101 08/17/2020 0955   BILITOT <0.2 08/17/2020 0955   GFRNONAA 110 08/28/2019 1213   GFRAA 127 08/28/2019 1213   Lab Results  Component Value Date   CHOL 143 08/17/2020   HDL 47 08/17/2020   LDLCALC 80 08/17/2020   TRIG 84 08/17/2020   CHOLHDL 3.0 08/17/2020   Lab Results  Component Value Date   HGBA1C 6.2 (H) 08/17/2020   Lab Results  Component Value Date   VITAMINB12 339 08/17/2020   Lab Results  Component Value Date   TSH 2.290 08/28/2019    Margie Ege, AGNP-C, DNP 10/26/2021, 3:41 PM [facility] [address] [place], [place] [address] [phone]

## 2021-10-27 ENCOUNTER — Ambulatory Visit: Payer: Managed Care, Other (non HMO) | Admitting: Dermatology

## 2021-10-27 ENCOUNTER — Telehealth: Payer: Self-pay

## 2021-10-27 ENCOUNTER — Encounter: Payer: Self-pay | Admitting: Neurology

## 2021-10-27 ENCOUNTER — Ambulatory Visit: Payer: Managed Care, Other (non HMO) | Admitting: Neurology

## 2021-10-27 VITALS — BP 156/93 | HR 77 | Ht 64.0 in | Wt 246.0 lb

## 2021-10-27 DIAGNOSIS — L409 Psoriasis, unspecified: Secondary | ICD-10-CM

## 2021-10-27 DIAGNOSIS — Z79899 Other long term (current) drug therapy: Secondary | ICD-10-CM | POA: Diagnosis not present

## 2021-10-27 DIAGNOSIS — G43709 Chronic migraine without aura, not intractable, without status migrainosus: Secondary | ICD-10-CM

## 2021-10-27 MED ORDER — SKYRIZI 150 MG/ML ~~LOC~~ SOSY: 150.0000 mg | PREFILLED_SYRINGE | SUBCUTANEOUS | 1 refills | Status: DC

## 2021-10-27 NOTE — Progress Notes (Signed)
   Follow-Up Visit   Subjective  Nicole Mendez is a 46 y.o. female who presents for the following: Psoriasis.  Patient presents for 1 year follow-up psoriasis. Patient is clear except one small spot on her right lower leg. She has not had a Skyrizi injection since January 2023 and has not been on any topicals. In the past she has been on Cosentyx and Stelara. Skyrizi works the best for her, she needs rfs. No side effects, no infections. Just got TB test.  The following portions of the chart were reviewed this encounter and updated as appropriate:       Review of Systems:  No other skin or systemic complaints except as noted in HPI or Assessment and Plan.  Objective  Well appearing patient in no apparent distress; mood and affect are within normal limits.  A focused examination was performed including face, arms, legs. Relevant physical exam findings are noted in the Assessment and Plan.  trunk; extremities Pink scaly papule on the right pretibia; hyperpigmented patches on the lower legs    Assessment & Plan  Psoriasis trunk; extremities  Chronic condition with duration or expected duration over one year. Currently well-controlled on Skyrizi.  Needs rfs.  Psoriasis is a chronic non-curable, but treatable genetic/hereditary disease that may have other systemic features affecting other organ systems such as joints (Psoriatic Arthritis). It is associated with an increased risk of inflammatory bowel disease, heart disease, non-alcoholic fatty liver disease, and depression.    TB from 10/08/2021 negative. Patient has upcoming labs including CBC and CMP.  Restart Skyrizi 150 mg/mL injected into the skin q 12 wks dsp 1 1Rf.  Reviewed risks of biologics including immunosuppression, infections, injection site reaction, and failure to improve condition. Goal is control of skin condition, not cure.  Some older biologics such as Humira and Enbrel may slightly increase risk of malignancy  and may worsen congestive heart failure.  Talz and Cosentyx may cause inflammatory bowel disease to flare. The use of biologics requires long term medication management, including periodic office visits and monitoring of blood work.    Related Medications ketoconazole (NIZORAL) 2 % shampoo Apply 1 Application topically once a week. Shampoo scalp weekly, let sit 5 minutes and rinse out  Risankizumab-rzaa (SKYRIZI) 150 MG/ML SOSY Inject 150 mg into the skin as directed. Every 12 weeks for maintenance.   Return in about 6 months (around 04/29/2022) for Psoriasis.  ICherlyn Labella, CMA, am acting as scribe for Willeen Niece, MD .  Documentation: I have reviewed the above documentation for accuracy and completeness, and I agree with the above.  Willeen Niece MD

## 2021-10-27 NOTE — Telephone Encounter (Signed)
FMLA pw was brought to the clinic on 10/27/2021. Discussed with Maralyn Sago and we renewed at the time of the appt.  PT left with pw in hand.

## 2021-10-27 NOTE — Patient Instructions (Addendum)
Reviewed risks of biologics including immunosuppression, infections, injection site reaction, and failure to improve condition. Goal is control of skin condition, not cure.  Some older biologics such as Humira and Enbrel may slightly increase risk of malignancy and may worsen congestive heart failure.  Talz and Cosentyx may cause inflammatory bowel disease to flare. The use of biologics requires long term medication management, including periodic office visits and monitoring of blood work.   Due to recent changes in healthcare laws, you may see results of your pathology and/or laboratory studies on MyChart before the doctors have had a chance to review them. We understand that in some cases there may be results that are confusing or concerning to you. Please understand that not all results are received at the same time and often the doctors may need to interpret multiple results in order to provide you with the best plan of care or course of treatment. Therefore, we ask that you please give us 2 business days to thoroughly review all your results before contacting the office for clarification. Should we see a critical lab result, you will be contacted sooner.   If You Need Anything After Your Visit  If you have any questions or concerns for your doctor, please call our main line at [phone] and press option 4 to reach your doctor's medical assistant. If no one answers, please leave a voicemail as directed and we will return your call as soon as possible. Messages left after 4 pm will be answered the following business day.   You may also send us a message via MyChart. We typically respond to MyChart messages within 1-2 business days.  For prescription refills, please ask your pharmacy to contact our office. Our fax number is [phone].  If you have an urgent issue when the clinic is closed that cannot wait until the next business day, you can page your doctor at the number below.    Please  note that while we do our best to be available for urgent issues outside of office hours, we are not available 24/7.   If you have an urgent issue and are unable to reach us, you may choose to seek medical care at your doctor's office, retail clinic, urgent care center, or emergency room.  If you have a medical emergency, please immediately call 911 or go to the emergency department.  Pager Numbers  - Dr. Kowalski: [phone]  - Dr. Moye: [phone]  - Dr. Stewart: [phone]  In the event of inclement weather, please call our main line at [phone] for an update on the status of any delays or closures.  Dermatology Medication Tips: Please keep the boxes that topical medications come in in order to help keep track of the instructions about where and how to use these. Pharmacies typically print the medication instructions only on the boxes and not directly on the medication tubes.   If your medication is too expensive, please contact our office at [phone] option 4 or send us a message through MyChart.   We are unable to tell what your co-pay for medications will be in advance as this is different depending on your insurance coverage. However, we may be able to find a substitute medication at lower cost or fill out paperwork to get insurance to cover a needed medication.   If a prior authorization is required to get your medication covered by your insurance company, please allow us 1-2 business days to complete this process.  Drug prices often vary   depending on where the prescription is filled and some pharmacies may offer cheaper prices.  The website www.goodrx.com contains coupons for medications through different pharmacies. The prices here do not account for what the cost may be with help from insurance (it may be cheaper with your insurance), but the website can give you the price if you did not use any insurance.  - You can print the associated coupon and take it with  your prescription to the pharmacy.  - You may also stop by our office during regular business hours and pick up a GoodRx coupon card.  - If you need your prescription sent electronically to a different pharmacy, notify our office through [facility] MyChart or by phone at [phone] option 4.     Si Usted Necesita Algo Despus de Su Visita  Tambin puede enviarnos un mensaje a travs de MyChart. Por lo general respondemos a los mensajes de MyChart en el transcurso de 1 a 2 das hbiles.  Para renovar recetas, por favor pida a su farmacia que se ponga en contacto con nuestra oficina. Nuestro nmero de fax es el [phone].  Si tiene un asunto urgente cuando la clnica est cerrada y que no puede esperar hasta el siguiente da hbil, puede llamar/localizar a su doctor(a) al nmero que aparece a continuacin.   Por favor, tenga en cuenta que aunque hacemos todo lo posible para estar disponibles para asuntos urgentes fuera del horario de oficina, no estamos disponibles las 24 horas del da, los 7 das de la semana.   Si tiene un problema urgente y no puede comunicarse con nosotros, puede optar por buscar atencin mdica  en el consultorio de su doctor(a), en una clnica privada, en un centro de atencin urgente o en una sala de emergencias.  Si tiene una emergencia mdica, por favor llame inmediatamente al 911 o vaya a la sala de emergencias.  Nmeros de bper  - Dr. Kowalski: [phone]  - Dra. Moye: [phone]  - Dra. Stewart: [phone]  En caso de inclemencias del tiempo, por favor llame a nuestra lnea principal al [phone] para una actualizacin sobre el estado de cualquier retraso o cierre.  Consejos para la medicacin en dermatologa: Por favor, guarde las cajas en las que vienen los medicamentos de uso tpico para ayudarle a seguir las instrucciones sobre dnde y cmo usarlos. Las farmacias generalmente imprimen las instrucciones del medicamento slo en las cajas y  no directamente en los tubos del medicamento.   Si su medicamento es muy caro, por favor, pngase en contacto con nuestra oficina llamando al [phone] y presione la opcin 4 o envenos un mensaje a travs de MyChart.   No podemos decirle cul ser su copago por los medicamentos por adelantado ya que esto es diferente dependiendo de la cobertura de su seguro. Sin embargo, es posible que podamos encontrar un medicamento sustituto a menor costo o llenar un formulario para que el seguro cubra el medicamento que se considera necesario.   Si se requiere una autorizacin previa para que su compaa de seguros cubra su medicamento, por favor permtanos de 1 a 2 das hbiles para completar este proceso.  Los precios de los medicamentos varan con frecuencia dependiendo del lugar de dnde se surte la receta y alguna farmacias pueden ofrecer precios ms baratos.  El sitio web www.goodrx.com tiene cupones para medicamentos de diferentes farmacias. Los precios aqu no tienen en cuenta lo que podra costar con la ayuda del seguro (puede ser ms barato con su seguro),   pero el sitio web puede darle el precio si no utiliz ningn seguro.  - Puede imprimir el cupn correspondiente y llevarlo con su receta a la farmacia.  - Tambin puede pasar por nuestra oficina durante el horario de atencin regular y recoger una tarjeta de cupones de GoodRx.  - Si necesita que su receta se enve electrnicamente a una farmacia diferente, informe a nuestra oficina a travs de MyChart de [facility] o por telfono llamando al [phone] y presione la opcin 4.  

## 2021-10-28 ENCOUNTER — Encounter: Payer: Self-pay | Admitting: Internal Medicine

## 2021-11-01 NOTE — Addendum Note (Signed)
Addended by: Quentin Ore on: 11/01/2021 08:17 AM   Modules accepted: Orders

## 2021-11-18 ENCOUNTER — Ambulatory Visit: Payer: Managed Care, Other (non HMO) | Admitting: Infectious Diseases

## 2021-11-30 ENCOUNTER — Ambulatory Visit (INDEPENDENT_AMBULATORY_CARE_PROVIDER_SITE_OTHER): Payer: Managed Care, Other (non HMO)

## 2021-11-30 ENCOUNTER — Encounter: Payer: Self-pay | Admitting: Internal Medicine

## 2021-11-30 ENCOUNTER — Ambulatory Visit: Payer: Managed Care, Other (non HMO) | Admitting: Internal Medicine

## 2021-11-30 VITALS — BP 138/82 | HR 89 | Temp 98.5°F | Ht 64.0 in | Wt 252.2 lb

## 2021-11-30 DIAGNOSIS — Z23 Encounter for immunization: Secondary | ICD-10-CM

## 2021-11-30 DIAGNOSIS — M898X8 Other specified disorders of bone, other site: Secondary | ICD-10-CM

## 2021-11-30 DIAGNOSIS — R079 Chest pain, unspecified: Secondary | ICD-10-CM

## 2021-11-30 NOTE — Patient Instructions (Signed)
Call and scheduled infectious disease appt   Phone Fax E-mail Address  [phone] Not available [address] [place] [address]     Specialties     Infectious Diseases

## 2021-11-30 NOTE — Progress Notes (Addendum)
Chief Complaint  Patient presents with  . Breast Problem    Pt wants a wellness screening and a lump on her breast near sternum examined    F/u  1. Wellness screening form due for work waist measured 49.5 inches  2. C/o sternal mass new and painful and hard nothing tried 3. RPR missed ID f/u tx'ed x 2 x in the ed advised to call to schedule    Review of Systems  Constitutional:  Negative for weight loss.  HENT:  Negative for hearing loss.   Eyes:  Negative for blurred vision.  Respiratory:  Negative for shortness of breath.   Cardiovascular:  Negative for chest pain.  Gastrointestinal:  Negative for abdominal pain and blood in stool.  Genitourinary:  Negative for dysuria.  Musculoskeletal:  Negative for falls and joint pain.  Skin:  Negative for rash.  Neurological:  Negative for headaches.  Psychiatric/Behavioral:  Negative for depression.    Past Medical History:  Diagnosis Date  . Alcohol abuse   . Arthritis   . Cervical radiculopathy   . COVID-19   . Diabetes mellitus without complication ([place]) 3953  . Headache   . Herpes    1 and 2 since 46 y.o   . Hyperlipidemia   . Hypertension   . Morbid obesity ([place])   . OSA (obstructive sleep apnea)    Not on CPAP  . Psoriasis   . Tobacco abuse    Past Surgical History:  Procedure Laterality Date  . No past surgery.     Family History  Problem Relation Age of Onset  . Alcohol abuse Maternal Grandmother   . Cancer Maternal Grandmother        Ovarian  . Hypertension Maternal Grandmother   . Diabetes Maternal Grandmother   . Hypertension Mother   . Diabetes Mother   . Colon cancer Other 18  . Breast cancer Other        + FH  . Aneurysm Maternal Grandfather   . Alcohol abuse Maternal Grandfather   . Other Father        unsure of medical history   Social History   Socioeconomic History  . Marital status: Single    Spouse name: Not on file  . Number of children: 0  . Years of education: college  . Highest  education level: Associate degree: academic program  Occupational History  . Occupation: customer service  Tobacco Use  . Smoking status: Former    Packs/day: 0.50    Types: Cigarettes  . Smokeless tobacco: Never  . Tobacco comments:    stopped 11/2015  Vaping Use  . Vaping Use: Never used  Substance and Sexual Activity  . Alcohol use: Yes    Comment: occasionally  . Drug use: No  . Sexual activity: Yes    Partners: Male    Birth control/protection: None    Comment: 1 partner  Other Topics Concern  . Not on file  Social History Narrative   Engaged as of 09/2021 to fiance   No kids   Right-handed.   No daily caffeine use.   Social Determinants of Health   Financial Resource Strain: Not on file  Food Insecurity: Not on file  Transportation Needs: Not on file  Physical Activity: Not on file  Stress: Not on file  Social Connections: Not on file  Intimate Partner Violence: Not on file   No outpatient medications have been marked as taking for the 11/30/21 encounter (Office Visit) with McLean-Scocuzza, Olivia Mackie  N, MD.   No Known Allergies Recent Results (from the past 2160 hour(s))  QuantiFERON-TB Gold Plus     Status: None   Collection Time: 10/08/21  3:38 PM  Result Value Ref Range   QuantiFERON Incubation Incubation performed.    QuantiFERON Criteria Comment     Comment: QuantiFERON-TB Gold Plus is a qualitative indirect test for M tuberculosis infection (including disease) and is intended for use in conjunction with risk assessment, radiography, and other medical and diagnostic evaluations. The QuantiFERON-TB Gold Plus result is determined by subtracting the Nil value from either TB antigen (Ag) value. The Mitogen tube serves as a control for the test.    QuantiFERON TB1 Ag Value 0.07 IU/mL   QuantiFERON TB2 Ag Value 0.04 IU/mL   QuantiFERON Nil Value 0.03 IU/mL   QuantiFERON Mitogen Value >10.00 IU/mL   QuantiFERON-TB Gold Plus Negative Negative    Comment: No  response to M tuberculosis antigens detected. Infection with M tuberculosis is unlikely, but high risk individuals should be considered for additional testing (ATS/IDSA/CDC Clinical Practice Guidelines, 2017). The reference range is an Antigen minus Nil result of <0.35 IU/mL. Chemiluminescence immunoassay methodology    Objective  Body mass index is 43.29 kg/m. Wt Readings from Last 3 Encounters:  11/30/21 252 lb 3.2 oz (114.4 kg)  10/27/21 246 lb (111.6 kg)  10/08/21 244 lb 6.4 oz (110.9 kg)   Temp Readings from Last 3 Encounters:  11/30/21 98.5 F (36.9 C) (Oral)  10/08/21 98.5 F (36.9 C) (Oral)  06/28/21 98.2 F (36.8 C)   BP Readings from Last 3 Encounters:  11/30/21 138/82  10/27/21 (!) 156/93  10/08/21 122/70   Pulse Readings from Last 3 Encounters:  11/30/21 89  10/27/21 77  10/08/21 97    Physical Exam Vitals and nursing note reviewed.  Constitutional:      Appearance: Normal appearance. She is well-developed and well-groomed.  HENT:     Head: Normocephalic and atraumatic.  Eyes:     Conjunctiva/sclera: Conjunctivae normal.     Pupils: Pupils are equal, round, and reactive to light.  Cardiovascular:     Rate and Rhythm: Normal rate and regular rhythm.     Heart sounds: Normal heart sounds. No murmur heard. Pulmonary:     Effort: Pulmonary effort is normal.     Breath sounds: Normal breath sounds.  Chest:    Abdominal:     General: Abdomen is flat. Bowel sounds are normal.     Tenderness: There is no abdominal tenderness.  Musculoskeletal:        General: No tenderness.  Skin:    General: Skin is warm and dry.  Neurological:     General: No focal deficit present.     Mental Status: She is alert and oriented to person, place, and time. Mental status is at baseline.     Cranial Nerves: Cranial nerves 2-12 are intact.     Motor: Motor function is intact.     Coordination: Coordination is intact.     Gait: Gait is intact.  Psychiatric:         Attention and Perception: Attention and perception normal.        Mood and Affect: Mood and affect normal.        Speech: Speech normal.        Behavior: Behavior normal. Behavior is cooperative.        Thought Content: Thought content normal.        Cognition and Memory: Cognition and  memory normal.        Judgment: Judgment normal.    Assessment  Plan  Mass of sternum - Plan: DG Sternum, CT Chest Wo Contrast  Need for immunization against influenza - Plan: Flu Vaccine QUAD 1moIM (Fluarix, Fluzone & Alfiuria Quad PF)  Chest pain, unspecified type - Plan: CT Chest Wo Contrast   HM Flu utd given today Tdap given 10/08/21 pna 23 had 08/16/2018  consider prevnar 20 in the future  covid 2/2 moderna consider booster 3rd dose  Hep B immune Hep C negative 08/17/20   mammo 03/04/19 with bx negative 03/08/19 left breast clip Mammo 02/16/21 left breast abnormal due dx mammo and uKoreain 3 months ordered for asap as of 10/08/21 -screening right breast due 02/16/22    Pap 02/04/21 negative neg hpv   H/o +RPR 09/2020 and 03/2021 in the ED referred to ID as of 10/11/21    Colonoscopy +blood in stool 44 referred no  FH colon cancer  Referred [facility]  rec healthy diet and exercise    rec MMR vx health dept   Rec healthy diet and exercise    [facility] vision ROI today   Provider: Dr. TOlivia MackieMcLean-Scocuzza-Internal Medicine

## 2021-12-02 ENCOUNTER — Other Ambulatory Visit: Payer: Self-pay | Admitting: Internal Medicine

## 2021-12-02 DIAGNOSIS — E559 Vitamin D deficiency, unspecified: Secondary | ICD-10-CM

## 2021-12-02 MED ORDER — CHOLECALCIFEROL 1.25 MG (50000 UT) PO CAPS: 50000.0000 [IU] | ORAL_CAPSULE | ORAL | 2 refills | Status: AC

## 2021-12-03 LAB — COMPREHENSIVE METABOLIC PANEL
ALT: 12 IU/L (ref 0–32)
AST: 17 IU/L (ref 0–40)
Albumin/Globulin Ratio: 1.4 (ref 1.2–2.2)
Albumin: 4.3 g/dL (ref 3.9–4.9)
Alkaline Phosphatase: 107 IU/L (ref 44–121)
BUN/Creatinine Ratio: 19 (ref 9–23)
BUN: 12 mg/dL (ref 6–24)
Bilirubin Total: 0.2 mg/dL (ref 0.0–1.2)
CO2: 25 mmol/L (ref 20–29)
Calcium: 8.9 mg/dL (ref 8.7–10.2)
Chloride: 103 mmol/L (ref 96–106)
Creatinine, Ser: 0.62 mg/dL (ref 0.57–1.00)
Globulin, Total: 3.1 g/dL (ref 1.5–4.5)
Glucose: 89 mg/dL (ref 70–99)
Potassium: 4.5 mmol/L (ref 3.5–5.2)
Sodium: 139 mmol/L (ref 134–144)
Total Protein: 7.4 g/dL (ref 6.0–8.5)
eGFR: 110 mL/min/{1.73_m2} (ref >59)

## 2021-12-03 LAB — MICROALBUMIN / CREATININE URINE RATIO
Creatinine, Urine: 102.7 mg/dL
Microalb/Creat Ratio: 29 mg/g creat (ref 0–29)
Microalbumin, Urine: 29.4 ug/mL

## 2021-12-03 LAB — CBC WITH DIFFERENTIAL/PLATELET
Basophils Absolute: 0 10*3/uL (ref 0.0–0.2)
Basos: 1 %
EOS (ABSOLUTE): 0.2 10*3/uL (ref 0.0–0.4)
Eos: 4 %
Hematocrit: 37.1 % (ref 34.0–46.6)
Hemoglobin: 12.1 g/dL (ref 11.1–15.9)
Immature Grans (Abs): 0 10*3/uL (ref 0.0–0.1)
Immature Granulocytes: 0 %
Lymphocytes Absolute: 2.3 10*3/uL (ref 0.7–3.1)
Lymphs: 39 %
MCH: 26.7 pg (ref 26.6–33.0)
MCHC: 32.6 g/dL (ref 31.5–35.7)
MCV: 82 fL (ref 79–97)
Monocytes Absolute: 0.6 10*3/uL (ref 0.1–0.9)
Monocytes: 11 %
Neutrophils Absolute: 2.7 10*3/uL (ref 1.4–7.0)
Neutrophils: 45 %
Platelets: 380 10*3/uL (ref 150–450)
RBC: 4.54 x10E6/uL (ref 3.77–5.28)
RDW: 13.1 % (ref 11.7–15.4)
WBC: 5.9 10*3/uL (ref 3.4–10.8)

## 2021-12-03 LAB — URINALYSIS, ROUTINE W REFLEX MICROSCOPIC
Bilirubin, UA: NEGATIVE
Glucose, UA: NEGATIVE
Ketones, UA: NEGATIVE
Nitrite, UA: NEGATIVE
Specific Gravity, UA: 1.019 (ref 1.005–1.030)
Urobilinogen, Ur: 0.2 mg/dL (ref 0.2–1.0)
pH, UA: 6 (ref 5.0–7.5)

## 2021-12-03 LAB — MICROSCOPIC EXAMINATION: Casts: NONE SEEN /lpf

## 2021-12-03 LAB — LIPID PANEL
Chol/HDL Ratio: 3.2 ratio (ref 0.0–4.4)
Cholesterol, Total: 191 mg/dL (ref 100–199)
HDL: 59 mg/dL (ref >39)
LDL Chol Calc (NIH): 122 mg/dL — ABNORMAL HIGH (ref 0–99)
Triglycerides: 54 mg/dL (ref 0–149)
VLDL Cholesterol Cal: 10 mg/dL (ref 5–40)

## 2021-12-03 LAB — TSH: TSH: 1.65 u[IU]/mL (ref 0.450–4.500)

## 2021-12-03 LAB — VITAMIN D 25 HYDROXY (VIT D DEFICIENCY, FRACTURES): Vit D, 25-Hydroxy: 14.9 ng/mL — ABNORMAL LOW (ref 30.0–100.0)

## 2021-12-03 LAB — IRON,TIBC AND FERRITIN PANEL
Ferritin: 14 ng/mL — ABNORMAL LOW (ref 15–150)
Iron Saturation: 7 % — CL (ref 15–55)
Iron: 28 ug/dL (ref 27–159)
Total Iron Binding Capacity: 426 ug/dL (ref 250–450)
UIBC: 398 ug/dL (ref 131–425)

## 2021-12-03 LAB — HEMOGLOBIN A1C
Est. average glucose Bld gHb Est-mCnc: 126 mg/dL
Hgb A1c MFr Bld: 6 % — ABNORMAL HIGH (ref 4.8–5.6)

## 2021-12-03 LAB — VITAMIN B12: Vitamin B-12: 453 pg/mL (ref 232–1245)

## 2021-12-08 ENCOUNTER — Encounter: Payer: Self-pay | Admitting: Internal Medicine

## 2021-12-08 NOTE — Telephone Encounter (Signed)
Can you help CT chest cancelled no PA?

## 2021-12-10 ENCOUNTER — Telehealth: Payer: Self-pay | Admitting: Internal Medicine

## 2021-12-10 ENCOUNTER — Ambulatory Visit: Payer: Managed Care, Other (non HMO)

## 2021-12-10 NOTE — Telephone Encounter (Signed)
Lft pt vm to call ofc . thanks 

## 2022-01-12 ENCOUNTER — Encounter: Payer: Self-pay | Admitting: Internal Medicine

## 2022-01-12 ENCOUNTER — Ambulatory Visit (INDEPENDENT_AMBULATORY_CARE_PROVIDER_SITE_OTHER): Payer: Managed Care, Other (non HMO) | Admitting: Internal Medicine

## 2022-01-12 VITALS — BP 138/78 | HR 97 | Temp 98.1°F | Ht 64.0 in | Wt 252.0 lb

## 2022-01-12 DIAGNOSIS — B351 Tinea unguium: Secondary | ICD-10-CM

## 2022-01-12 DIAGNOSIS — B353 Tinea pedis: Secondary | ICD-10-CM | POA: Diagnosis not present

## 2022-01-12 DIAGNOSIS — L03119 Cellulitis of unspecified part of limb: Secondary | ICD-10-CM | POA: Diagnosis not present

## 2022-01-12 DIAGNOSIS — B3731 Acute candidiasis of vulva and vagina: Secondary | ICD-10-CM

## 2022-01-12 MED ORDER — CLOTRIMAZOLE-BETAMETHASONE 1-0.05 % EX CREA: 1.0000 | TOPICAL_CREAM | Freq: Every day | CUTANEOUS | 2 refills | Status: AC

## 2022-01-12 MED ORDER — TERBINAFINE HCL 1 % EX CREA: 1.0000 | TOPICAL_CREAM | Freq: Two times a day (BID) | CUTANEOUS | 11 refills | Status: AC

## 2022-01-12 MED ORDER — FLUCONAZOLE 150 MG PO TABS: 150.0000 mg | ORAL_TABLET | Freq: Once | ORAL | 0 refills | Status: AC

## 2022-01-12 MED ORDER — TERBINAFINE HCL 250 MG PO TABS: 250.0000 mg | ORAL_TABLET | Freq: Every day | ORAL | 0 refills | Status: AC

## 2022-01-12 MED ORDER — DOXYCYCLINE HYCLATE 100 MG PO TABS: 100.0000 mg | ORAL_TABLET | Freq: Two times a day (BID) | ORAL | 0 refills | Status: DC

## 2022-01-12 NOTE — Progress Notes (Signed)
Chief Complaint  Patient presents with   Blister    Blister popped but pt had blister on the right foot.   F/u  1. Blister in between 2nd and 3rd toes which popped recently with toenail fungus and toenail fungus and left 4th toe around the nail bed redness    Review of Systems  Constitutional:  Negative for weight loss.  HENT:  Negative for hearing loss.   Eyes:  Negative for blurred vision.  Respiratory:  Negative for shortness of breath.   Cardiovascular:  Negative for chest pain.  Gastrointestinal:  Negative for abdominal pain and blood in stool.  Genitourinary:  Negative for dysuria.  Musculoskeletal:  Negative for falls and joint pain.  Skin:  Negative for rash.  Neurological:  Negative for headaches.  Psychiatric/Behavioral:  Negative for depression.    Past Medical History:  Diagnosis Date   Alcohol abuse    Arthritis    Cervical radiculopathy    COVID-19    Diabetes mellitus without complication ([place]) 9528   Headache    Herpes    1 and 2 since 46 y.o    Hyperlipidemia    Hypertension    Morbid obesity ([place])    OSA (obstructive sleep apnea)    Not on CPAP   Psoriasis    Tobacco abuse    Past Surgical History:  Procedure Laterality Date   No past surgery.     Family History  Problem Relation Age of Onset   Alcohol abuse Maternal Grandmother    Cancer Maternal Grandmother        Ovarian   Hypertension Maternal Grandmother    Diabetes Maternal Grandmother    Hypertension Mother    Diabetes Mother    Colon cancer Other 23   Breast cancer Other        + FH   Aneurysm Maternal Grandfather    Alcohol abuse Maternal Grandfather    Other Father        unsure of medical history   Social History   Socioeconomic History   Marital status: Single    Spouse name: Not on file   Number of children: 0   Years of education: college   Highest education level: Associate degree: academic program  Occupational History   Occupation: customer service  Tobacco Use    Smoking status: Former    Packs/day: 0.50    Types: Cigarettes   Smokeless tobacco: Never   Tobacco comments:    stopped 11/2015  Vaping Use   Vaping Use: Never used  Substance and Sexual Activity   Alcohol use: Yes    Comment: occasionally   Drug use: No   Sexual activity: Yes    Partners: Male    Birth control/protection: None    Comment: 1 partner  Other Topics Concern   Not on file  Social History Narrative   Engaged as of 09/2021 to fiance   No kids   Right-handed.   No daily caffeine use.   Social Determinants of Health   Financial Resource Strain: Not on file  Food Insecurity: Not on file  Transportation Needs: Not on file  Physical Activity: Not on file  Stress: Not on file  Social Connections: Not on file  Intimate Partner Violence: Not on file   Current Meds  Medication Sig   acetaminophen (TYLENOL) 650 MG CR tablet Take 650 mg by mouth every 8 (eight) hours as needed for pain.    albuterol (VENTOLIN HFA) 108 (90 Base) MCG/ACT inhaler  Inhale 1-2 puffs into the lungs every 4 (four) hours as needed for wheezing or shortness of breath.   amLODipine (NORVASC) 10 MG tablet Take 1 tablet (10 mg total) by mouth daily.   aspirin 81 MG tablet Take 81 mg by mouth daily.   Cholecalciferol 1.25 MG (50000 UT) capsule Take 1 capsule (50,000 Units total) by mouth once a week.   clotrimazole-betamethasone (LOTRISONE) cream Apply 1 Application topically daily. Spot treat foot itching   cyclobenzaprine (FLEXERIL) 5 MG tablet Take 1 tablet (5 mg total) by mouth daily as needed for muscle spasms.   doxycycline (VIBRA-TABS) 100 MG tablet Take 1 tablet (100 mg total) by mouth 2 (two) times daily. With Food   fluconazole (DIFLUCAN) 150 MG tablet Take 1 tablet (150 mg total) by mouth once for 1 dose.   ketoconazole (NIZORAL) 2 % shampoo Apply 1 Application topically once a week. Shampoo scalp weekly, let sit 5 minutes and rinse out   lisinopril (ZESTRIL) 40 MG tablet Take 1 tablet  (40 mg total) by mouth daily.   metFORMIN (GLUCOPHAGE) 500 MG tablet Take 1 tablet (500 mg total) by mouth 2 (two) times daily with a meal.   metoprolol succinate (TOPROL-XL) 25 MG 24 hr tablet Take 1 tablet (25 mg total) by mouth daily. At night   metroNIDAZOLE (FLAGYL) 500 MG tablet Take 1 tablet (500 mg total) by mouth 2 (two) times daily with a meal. DO NOT CONSUME ALCOHOL WHILE TAKING THIS MEDICATION.   ondansetron (ZOFRAN ODT) 4 MG disintegrating tablet Take 1 tablet (4 mg total) by mouth every 8 (eight) hours as needed for nausea or vomiting.   pravastatin (PRAVACHOL) 40 MG tablet Take 1 tablet (40 mg total) by mouth daily.   Risankizumab-rzaa (SKYRIZI) 150 MG/ML SOSY Inject 150 mg into the skin as directed. Every 12 weeks for maintenance.   rizatriptan (MAXALT-MLT) 10 MG disintegrating tablet Take 1 tablet (10 mg total) by mouth as needed for migraine. May repeat in 2 hours if needed   spironolactone (ALDACTONE) 50 MG tablet Take 1 tablet (50 mg total) by mouth daily. In am   terbinafine (LAMISIL AT) 1 % cream Apply 1 Application topically 2 (two) times daily. Feet and in between toes   terbinafine (LAMISIL) 250 MG tablet Take 1 tablet (250 mg total) by mouth daily.   valACYclovir (VALTREX) 1000 MG tablet TAKE 1 TABLET BY MOUTH TWICE DAILY FOR 5 DAYS AS NEEDED   venlafaxine XR (EFFEXOR XR) 150 MG 24 hr capsule Take 1 capsule (150 mg total) by mouth daily with breakfast.   No Known Allergies Recent Results (from the past 2160 hour(s))  Comprehensive metabolic panel     Status: None   Collection Time: 12/01/21  9:52 AM  Result Value Ref Range   Glucose 89 70 - 99 mg/dL   BUN 12 6 - 24 mg/dL   Creatinine, Ser 0.62 0.57 - 1.00 mg/dL   eGFR 110 >59 mL/min/1.73   BUN/Creatinine Ratio 19 9 - 23   Sodium 139 134 - 144 mmol/L   Potassium 4.5 3.5 - 5.2 mmol/L   Chloride 103 96 - 106 mmol/L   CO2 25 20 - 29 mmol/L   Calcium 8.9 8.7 - 10.2 mg/dL   Total Protein 7.4 6.0 - 8.5 g/dL   Albumin  4.3 3.9 - 4.9 g/dL   Globulin, Total 3.1 1.5 - 4.5 g/dL   Albumin/Globulin Ratio 1.4 1.2 - 2.2   Bilirubin Total 0.2 0.0 - 1.2 mg/dL  Alkaline Phosphatase 107 44 - 121 IU/L   AST 17 0 - 40 IU/L   ALT 12 0 - 32 IU/L  Lipid panel     Status: Abnormal   Collection Time: 12/01/21  9:52 AM  Result Value Ref Range   Cholesterol, Total 191 100 - 199 mg/dL   Triglycerides 54 0 - 149 mg/dL   HDL 59 >39 mg/dL   VLDL Cholesterol Cal 10 5 - 40 mg/dL   LDL Chol Calc (NIH) 122 (H) 0 - 99 mg/dL   Chol/HDL Ratio 3.2 0.0 - 4.4 ratio    Comment:                                   T. Chol/HDL Ratio                                             Men  Women                               1/2 Avg.Risk  3.4    3.3                                   Avg.Risk  5.0    4.4                                2X Avg.Risk  9.6    7.1                                3X Avg.Risk 23.4   11.0   CBC with Differential/Platelet     Status: None   Collection Time: 12/01/21  9:52 AM  Result Value Ref Range   WBC 5.9 3.4 - 10.8 x10E3/uL   RBC 4.54 3.77 - 5.28 x10E6/uL   Hemoglobin 12.1 11.1 - 15.9 g/dL   Hematocrit 37.1 34.0 - 46.6 %   MCV 82 79 - 97 fL   MCH 26.7 26.6 - 33.0 pg   MCHC 32.6 31.5 - 35.7 g/dL   RDW 13.1 11.7 - 15.4 %   Platelets 380 150 - 450 x10E3/uL   Neutrophils 45 Not Estab. %   Lymphs 39 Not Estab. %   Monocytes 11 Not Estab. %   Eos 4 Not Estab. %   Basos 1 Not Estab. %   Neutrophils Absolute 2.7 1.4 - 7.0 x10E3/uL   Lymphocytes Absolute 2.3 0.7 - 3.1 x10E3/uL   Monocytes Absolute 0.6 0.1 - 0.9 x10E3/uL   EOS (ABSOLUTE) 0.2 0.0 - 0.4 x10E3/uL   Basophils Absolute 0.0 0.0 - 0.2 x10E3/uL   Immature Granulocytes 0 Not Estab. %   Immature Grans (Abs) 0.0 0.0 - 0.1 x10E3/uL  TSH     Status: None   Collection Time: 12/01/21  9:52 AM  Result Value Ref Range   TSH 1.650 0.450 - 4.500 uIU/mL  Urinalysis, Routine w reflex microscopic     Status: Abnormal   Collection Time: 12/01/21  9:52 AM  Result  Value Ref Range   Specific Gravity, UA 1.019 1.005 - 1.030  pH, UA 6.0 5.0 - 7.5   Color, UA Yellow Yellow   Appearance Ur Clear Clear   Leukocytes,UA 1+ (A) Negative   Protein,UA 1+ (A) Negative/Trace   Glucose, UA Negative Negative   Ketones, UA Negative Negative   RBC, UA 3+ (A) Negative   Bilirubin, UA Negative Negative   Urobilinogen, Ur 0.2 0.2 - 1.0 mg/dL   Nitrite, UA Negative Negative   Microscopic Examination See below:     Comment: Microscopic was indicated and was performed.  Microalbumin / creatinine urine ratio     Status: None   Collection Time: 12/01/21  9:52 AM  Result Value Ref Range   Creatinine, Urine 102.7 Not Estab. mg/dL   Microalbumin, Urine 29.4 Not Estab. ug/mL   Microalb/Creat Ratio 29 0 - 29 mg/g creat    Comment:                        Normal:                0 -  29                        Moderately increased: 30 - 300                        Severely increased:       >300   Hemoglobin A1c     Status: Abnormal   Collection Time: 12/01/21  9:52 AM  Result Value Ref Range   Hgb A1c MFr Bld 6.0 (H) 4.8 - 5.6 %    Comment:          Prediabetes: 5.7 - 6.4          Diabetes: >6.4          Glycemic control for adults with diabetes: <7.0    Est. average glucose Bld gHb Est-mCnc 126 mg/dL  Vitamin D (25 hydroxy)     Status: Abnormal   Collection Time: 12/01/21  9:52 AM  Result Value Ref Range   Vit D, 25-Hydroxy 14.9 (L) 30.0 - 100.0 ng/mL    Comment: Vitamin D deficiency has been defined by the [place] and an Endocrine Society practice guideline as a level of serum 25-OH vitamin D less than 20 ng/mL (1,2). The Endocrine Society went on to further define vitamin D insufficiency as a level between 21 and 29 ng/mL (2). 1. IOM (Institute of Medicine). 2010. Dietary reference    intakes for calcium and D. [place]: The    [employer]. 2. Holick MF, Binkley [place], Bischoff-Ferrari HA, et al.    Evaluation, treatment, and  prevention of vitamin D    deficiency: an Endocrine Society clinical practice    guideline. JCEM. 2011 Jul; 96(7):1911-30.   B12     Status: None   Collection Time: 12/01/21  9:52 AM  Result Value Ref Range   Vitamin B-12 453 232 - 1,245 pg/mL  Iron, TIBC and Ferritin Panel     Status: Abnormal   Collection Time: 12/01/21  9:52 AM  Result Value Ref Range   Total Iron Binding Capacity 426 250 - 450 ug/dL   UIBC 398 131 - 425 ug/dL   Iron 28 27 - 159 ug/dL   Iron Saturation 7 (LL) 15 - 55 %   Ferritin 14 (L) 15 - 150 ng/mL  Microscopic Examination     Status: Abnormal   Collection  Time: 12/01/21  9:52 AM  Result Value Ref Range   WBC, UA 11-30 (A) 0 - 5 /hpf   RBC, Urine 3-10 (A) 0 - 2 /hpf   Epithelial Cells (non renal) 0-10 0 - 10 /hpf   Casts None seen None seen /lpf   Bacteria, UA Few None seen/Few   Objective  Body mass index is 43.26 kg/m. Wt Readings from Last 3 Encounters:  01/12/22 252 lb (114.3 kg)  11/30/21 252 lb 3.2 oz (114.4 kg)  10/27/21 246 lb (111.6 kg)   Temp Readings from Last 3 Encounters:  01/12/22 98.1 F (36.7 C) (Oral)  11/30/21 98.5 F (36.9 C) (Oral)  10/08/21 98.5 F (36.9 C) (Oral)   BP Readings from Last 3 Encounters:  01/12/22 138/78  11/30/21 138/82  10/27/21 (!) 156/93   Pulse Readings from Last 3 Encounters:  01/12/22 97  11/30/21 89  10/27/21 77    Physical Exam Vitals and nursing note reviewed.  Constitutional:      Appearance: Normal appearance. She is well-developed and well-groomed.  HENT:     Head: Normocephalic and atraumatic.  Eyes:     Conjunctiva/sclera: Conjunctivae normal.     Pupils: Pupils are equal, round, and reactive to light.  Cardiovascular:     Rate and Rhythm: Normal rate and regular rhythm.     Heart sounds: Normal heart sounds. No murmur heard. Pulmonary:     Effort: Pulmonary effort is normal.     Breath sounds: Normal breath sounds.  Abdominal:     General: Abdomen is flat. Bowel sounds are  normal.     Tenderness: There is no abdominal tenderness.  Musculoskeletal:        General: No tenderness.  Skin:    General: Skin is warm and dry.  Neurological:     General: No focal deficit present.     Mental Status: She is alert and oriented to person, place, and time. Mental status is at baseline.     Cranial Nerves: Cranial nerves 2-12 are intact.     Motor: Motor function is intact.     Coordination: Coordination is intact.     Gait: Gait is intact.  Psychiatric:        Attention and Perception: Attention and perception normal.        Mood and Affect: Mood and affect normal.        Speech: Speech normal.        Behavior: Behavior normal. Behavior is cooperative.        Thought Content: Thought content normal.        Cognition and Memory: Cognition and memory normal.        Judgment: Judgment normal.     Assessment  Plan  Tinea pedis of both feet with 2ndary bacterial infection- Plan: terbinafine (LAMISIL) 250 MG tablet qd x 3 months, terbinafine (LAMISIL AT) 1 % cream, clotrimazole-betamethasone (LOTRISONE) cream, Hepatic function panel, CANCELED: Hepatic function panel Gold  Onychomycosis - Plan: terbinafine (LAMISIL) 250 MG tablet, terbinafine (LAMISIL AT) 1 % cream, Hepatic function panel in 3 months Rec gold bond talc free powder   Cellulitis of lower extremity, btwn 2nd and 3rd right foot toe and left 4th toenail Plan: doxycycline (VIBRA-TABS) 100 MG tablet  Yeast vaginitis - Plan: fluconazole (DIFLUCAN) 150 MG tablet prn doxycycline  Provider: Dr. Olivia Mackie McLean-Scocuzza-Internal Medicine

## 2022-01-12 NOTE — Patient Instructions (Signed)
Gold bond talc Free powder    Fungal Nail Infection A fungal nail infection is a common infection of the toenails or fingernails. This condition affects toenails more often than fingernails. It often affects the great, or big, toes. More than one nail may be infected. The condition can be passed from person to person (is contagious). What are the causes? This condition is caused by a fungus, such as yeast or molds. Several types of fungi can cause the infection. These fungi are common in moist and warm areas. If your hands or feet come into contact with the fungus, it may get into a crack in your fingernail or toenail or in the surrounding skin, and cause an infection. What increases the risk? The following factors may make you more likely to develop this condition: Being of older age. Having certain medical conditions, such as: Athlete's foot. Diabetes. Poor circulation. A weak body defense system (immune system). Walking barefoot in areas where the fungus thrives, such as showers or locker rooms. Wearing shoes and socks that cause your feet to sweat. Having a nail injury or a recent nail surgery. What are the signs or symptoms? Symptoms of this condition include: A pale spot on the nail. Thickening of the nail. A nail that becomes yellow, brown, or white. A brittle or ragged nail edge. A nail that has lifted away from the nail bed. How is this diagnosed? This condition is diagnosed with a physical exam. Your health care provider may take a scraping or clipping from your nail to test for the fungus. How is this treated? Treatment is not needed for mild infections. If you have significant nail changes, treatment may include: Antifungal medicines taken by mouth (orally). You may need to take the medicine for several weeks or several months, and you may not see the results for a long time. These medicines can cause side effects. Ask your health care provider what problems to watch  for. Antifungal nail polish or nail cream. These may be used along with oral antifungal medicines. Laser treatment of the nail. Surgery to remove the nail. This may be needed for the most severe infections. It can take a long time, usually up to a year, for the infection to go away. The infection may also come back. Follow these instructions at home: Medicines Take or apply over-the-counter and prescription medicines only as told by your health care provider. Ask your health care provider about using over-the-counter mentholated ointment on your nails. Nail care Trim your nails often. Wash and dry your hands and feet every day. Keep your feet dry. To do this: Wear absorbent socks, and change your socks frequently. Wear shoes that allow air to circulate, such as sandals or canvas tennis shoes. Throw out old shoes. If you go to a nail salon, make sure you choose one that uses clean instruments. Use antifungal foot powder on your feet and in your shoes. General instructions Do not share personal items, such as towels or nail clippers. Do not walk barefoot in shower rooms or locker rooms. Wear rubber gloves if you are working with your hands in wet areas. Keep all follow-up visits. This is important. Contact a health care provider if: You have redness, pain, or pus near the toenail or fingernail. Your infection is not getting better, or it is getting worse after several months. You have more circulation problems near the toenail or fingernail. You have brown or black discoloration of the nail that spreads to the surrounding skin.  Summary A fungal nail infection is a common infection of the toenails or fingernails. Treatment is not needed for mild infections. If you have significant nail changes, treatment may include taking medicine orally and applying medicine to your nails. It can take a long time, usually up to a year, for the infection to go away. The infection may also come back. Take  or apply over-the-counter and prescription medicines only as told by your health care provider. This information is not intended to replace advice given to you by your health care provider. Make sure you discuss any questions you have with your health care provider. Document Revised: 06/22/2020 Document Reviewed: 06/22/2020 Elsevier Patient Education  [place] Athlete's foot (tinea pedis) is a fungal infection of the skin on your feet. It often occurs on the skin that is between or underneath the toes. It can also occur on the soles of your feet. The infection can spread from person to person (is contagious). It can also spread when a person's bare feet come in contact with the fungus on shower floors or on items such as shoes. What are the causes? This condition is caused by a fungus that grows in warm, moist places. You can get athlete's foot by sharing shoes, shower stalls, towels, and wet floors with someone who is infected. Not washing your feet or changing your socks often enough can also lead to athlete's foot. What increases the risk? This condition is more likely to develop in: Men. People who have a weak body defense system (immune system). People who have diabetes. People who use public showers, such as at a gym. People who wear heavy-duty shoes, such as Environmental manager. Seasons with warm, humid weather. What are the signs or symptoms? Symptoms of this condition include: Itchy areas between your toes or on the soles of your feet. White, flaky, or scaly areas between your toes or on the soles of your feet. Very itchy small blisters between your toes or on the soles of your feet. Small cuts in your skin. These cuts can become infected. Thick or discolored toenails. How is this diagnosed? This condition may be diagnosed with a physical exam and a review of your medical history. Your health care provider may also take a skin or toenail sample to  examine under a microscope. How is this treated? This condition is treated with antifungal medicines. These may be applied as powders, ointments, or creams. In severe cases, an oral antifungal medicine may be given. Follow these instructions at home: Medicines Apply or take over-the-counter and prescription medicines only as told by your health care provider. Apply your antifungal medicine as told by your health care provider. Do not stop using the antifungal even if your condition improves. Foot care Do not scratch your feet. Keep your feet dry: Wear cotton or wool socks. Change your socks every day or if they become wet. Wear shoes that allow air to flow, such as sandals or canvas tennis shoes. Wash and dry your feet, including the area between your toes. Also, wash and dry your feet: Every day or as told by your health care provider. After exercising. General instructions Do not let others use towels, shoes, nail clippers, or other personal items that touch your feet. Protect your feet by wearing sandals in wet areas, such as locker rooms and shared showers. Keep all follow-up visits. This is important. If you have diabetes, keep your blood sugar under control. Contact a health  care provider if: You have a fever. You have swelling, soreness, warmth, or redness in your foot. Your feet are not getting better with treatment. Your symptoms get worse. You have new symptoms. You have severe pain. Summary Athlete's foot (tinea pedis) is a fungal infection of the skin on your feet. It often occurs on skin that is between or underneath the toes. This condition is caused by a fungus that grows in warm, moist places. Symptoms include white, flaky, or scaly areas between your toes or on the soles of your feet. This condition is treated with antifungal medicines. Keep your feet clean. Always dry them thoroughly. This information is not intended to replace advice given to you by your health  care provider. Make sure you discuss any questions you have with your health care provider. Document Revised: 07/12/2020 Document Reviewed: 07/12/2020 Elsevier Patient Education  [place].

## 2022-01-13 ENCOUNTER — Other Ambulatory Visit: Payer: Self-pay | Admitting: Internal Medicine

## 2022-01-13 DIAGNOSIS — M25511 Pain in right shoulder: Secondary | ICD-10-CM

## 2022-01-24 ENCOUNTER — Encounter: Payer: Self-pay | Admitting: Family Medicine

## 2022-01-24 ENCOUNTER — Ambulatory Visit (INDEPENDENT_AMBULATORY_CARE_PROVIDER_SITE_OTHER): Payer: Managed Care, Other (non HMO) | Admitting: Family Medicine

## 2022-01-24 VITALS — BP 125/70 | HR 69 | Temp 98.5°F | Ht 64.0 in | Wt 252.2 lb

## 2022-01-24 DIAGNOSIS — G8929 Other chronic pain: Secondary | ICD-10-CM | POA: Diagnosis not present

## 2022-01-24 DIAGNOSIS — J019 Acute sinusitis, unspecified: Secondary | ICD-10-CM | POA: Insufficient documentation

## 2022-01-24 DIAGNOSIS — M25511 Pain in right shoulder: Secondary | ICD-10-CM | POA: Diagnosis not present

## 2022-01-24 MED ORDER — AMOXICILLIN-POT CLAVULANATE 875-125 MG PO TABS: 1.0000 | ORAL_TABLET | Freq: Two times a day (BID) | ORAL | 0 refills | Status: AC

## 2022-01-24 NOTE — Assessment & Plan Note (Signed)
Symptoms are likely related to sinusitis though allergies could be playing a role.  We will treat with Augmentin 1 pill twice daily for 7 days.  She will also add Flonase 2 sprays each nostril once daily.  If not improving with this regimen she will let us know.

## 2022-01-24 NOTE — Assessment & Plan Note (Signed)
Likely rotator cuff impingement.  We will get an x-ray today.  She was given exercises to do at home.  If not improving she will let me know.  If the exercises are excessively painful she will contact us.

## 2022-01-24 NOTE — Progress Notes (Signed)
Tommi Rumps, MD Phone: [phone]  Nicole Mendez is a 46 y.o. female who presents today for same-day visit.  Congestion: Patient notes about a month of sinus congestion and pressure with blowing green mucus out.  She has postnasal drip.  Occasionally was feeling hot in the middle the night.  She uses Claritin.  She notes no fevers.  No COVID exposure notes she did do a couple of COVID test that were negative.  She is also been using Tylenol and Synex.  She does have a history of allergies though notes this feels different than her typical allergy flares.  Right shoulder pain: Patient notes this is chronic and been going on for a couple of years.  No injury.  Notes its not as bad as it was several weeks ago.  Notes if she reaches certain directions it bothers her.  Social History   Tobacco Use  Smoking Status Former   Packs/day: 0.50   Types: Cigarettes  Smokeless Tobacco Never  Tobacco Comments   stopped 11/2015    Current Outpatient Medications on File Prior to Visit  Medication Sig Dispense Refill   acetaminophen (TYLENOL) 650 MG CR tablet Take 650 mg by mouth every 8 (eight) hours as needed for pain.      albuterol (VENTOLIN HFA) 108 (90 Base) MCG/ACT inhaler Inhale 1-2 puffs into the lungs every 4 (four) hours as needed for wheezing or shortness of breath. 18 g 11   amLODipine (NORVASC) 10 MG tablet Take 1 tablet (10 mg total) by mouth daily. 90 tablet 3   aspirin 81 MG tablet Take 81 mg by mouth daily.     Cholecalciferol 1.25 MG (50000 UT) capsule Take 1 capsule (50,000 Units total) by mouth once a week. 13 capsule 2   clotrimazole-betamethasone (LOTRISONE) cream Apply 1 Application topically daily. Spot treat foot itching 45 g 2   cyclobenzaprine (FLEXERIL) 5 MG tablet Take 1 tablet (5 mg total) by mouth daily as needed for muscle spasms. 30 tablet 5   ketoconazole (NIZORAL) 2 % shampoo Apply 1 Application topically once a week. Shampoo scalp weekly, let sit 5  minutes and rinse out 120 mL 3   lisinopril (ZESTRIL) 40 MG tablet Take 1 tablet (40 mg total) by mouth daily. 90 tablet 3   metFORMIN (GLUCOPHAGE) 500 MG tablet Take 1 tablet (500 mg total) by mouth 2 (two) times daily with a meal. 180 tablet 3   metoprolol succinate (TOPROL-XL) 25 MG 24 hr tablet Take 1 tablet (25 mg total) by mouth daily. At night 90 tablet 3   ondansetron (ZOFRAN ODT) 4 MG disintegrating tablet Take 1 tablet (4 mg total) by mouth every 8 (eight) hours as needed for nausea or vomiting. 30 tablet 11   pravastatin (PRAVACHOL) 40 MG tablet Take 1 tablet (40 mg total) by mouth daily. 90 tablet 3   Risankizumab-rzaa (SKYRIZI) 150 MG/ML SOSY Inject 150 mg into the skin as directed. Every 12 weeks for maintenance. 1 mL 1   rizatriptan (MAXALT-MLT) 10 MG disintegrating tablet Take 1 tablet (10 mg total) by mouth as needed for migraine. May repeat in 2 hours if needed 12 tablet 11   spironolactone (ALDACTONE) 50 MG tablet Take 1 tablet (50 mg total) by mouth daily. In am 90 tablet 3   terbinafine (LAMISIL AT) 1 % cream Apply 1 Application topically 2 (two) times daily. Feet and in between toes 60 g 11   terbinafine (LAMISIL) 250 MG tablet Take 1 tablet (250 mg total)  by mouth daily. 90 tablet 0   valACYclovir (VALTREX) 1000 MG tablet TAKE 1 TABLET BY MOUTH TWICE DAILY FOR 5 DAYS AS NEEDED 90 tablet 3   venlafaxine XR (EFFEXOR XR) 150 MG 24 hr capsule Take 1 capsule (150 mg total) by mouth daily with breakfast. 90 capsule 3   No current facility-administered medications on file prior to visit.     ROS see history of present illness  Objective  Physical Exam Vitals:   01/24/22 1537  BP: 125/70  Pulse: 69  Temp: 98.5 F (36.9 C)  SpO2: 99%    BP Readings from Last 3 Encounters:  01/24/22 125/70  01/12/22 138/78  11/30/21 138/82   Wt Readings from Last 3 Encounters:  01/24/22 252 lb 3.2 oz (114.4 kg)  01/12/22 252 lb (114.3 kg)  11/30/21 252 lb 3.2 oz (114.4 kg)     Physical Exam Constitutional:      General: She is not in acute distress.    Appearance: She is not diaphoretic.  HENT:     Right Ear: Tympanic membrane normal.     Left Ear: Tympanic membrane normal.  Cardiovascular:     Rate and Rhythm: Normal rate and regular rhythm.     Heart sounds: Normal heart sounds.  Pulmonary:     Effort: Pulmonary effort is normal.     Breath sounds: Normal breath sounds.  Musculoskeletal:     Comments: Right shoulder with pain on active and passive internal range of motion, external range of motion, and abduction, full range of motion bilateral shoulders, right shoulder does have tenderness over the musculature anteriorly, no bony tenderness, positive empty can on the right, negative empty can on the left, negative speeds bilaterally  Lymphadenopathy:     Cervical: No cervical adenopathy.  Skin:    General: Skin is warm and dry.  Neurological:     Mental Status: She is alert.      Assessment/Plan: Please see individual problem list.  Problem List Items Addressed This Visit     Acute non-recurrent sinusitis - Primary    Symptoms are likely related to sinusitis though allergies could be playing a role.  We will treat with Augmentin 1 pill twice daily for 7 days.  She will also add Flonase 2 sprays each nostril once daily.  If not improving with this regimen she will let us know.      Relevant Medications   amoxicillin-clavulanate (AUGMENTIN) 875-125 MG tablet   Chronic right shoulder pain    Likely rotator cuff impingement.  We will get an x-ray today.  She was given exercises to do at home.  If not improving she will let me know.  If the exercises are excessively painful she will contact us.      Relevant Orders   DG Shoulder Right     No follow-ups on file.   Marikay Alar, MD [facility]

## 2022-01-24 NOTE — Patient Instructions (Signed)
Nice to see you. Please take the Augmentin.  If your sinus symptoms are not improving please let us know. You can start Flonase over-the-counter 2 sprays each nostril once daily. We will call you with your x-ray result. Do the exercises for your shoulder.  Shoulder Impingement Syndrome Rehab Ask your health care provider which exercises are safe for you. Do exercises exactly as told by your health care provider and adjust them as directed. It is normal to feel mild stretching, pulling, tightness, or discomfort as you do these exercises. Stop right away if you feel sudden pain or your pain gets worse. Do not begin these exercises until told by your health care provider. Stretching and range-of-motion exercise This exercise warms up your muscles and joints and improves the movement and flexibility of your shoulder. This exercise also helps to relieve pain and stiffness. Passive horizontal adduction In passive adduction, you use your other hand to move the injured arm toward your body. The injured arm does not move on its own. In this movement, your arm is moved across your body in the horizontal plane (horizontal adduction). Sit or stand and pull your left / right elbow across your chest, toward your other shoulder. Stop when you feel a gentle stretch in the back of your shoulder and upper arm. Keep your arm at shoulder height. Keep your arm as close to your body as you comfortably can. Hold for __________ seconds. Slowly return to the starting position. Repeat __________ times. Complete this exercise __________ times a day. Strengthening exercises These exercises build strength and endurance in your shoulder. Endurance is the ability to use your muscles for a long time, even after they get tired. External rotation, isometric This is an exercise in which you press the back of your wrist against a door frame without moving your shoulder joint (isometric). Stand or sit in a doorway, facing the door  frame. Bend your left / right elbow and place the back of your wrist against the door frame. Only the back of your wrist should be touching the frame. Keep your upper arm at your side. Gently press your wrist against the door frame, as if you are trying to push your arm away from your abdomen (external rotation). Press as hard as you are able without pain. Avoid shrugging your shoulder while you press your wrist against the door frame. Keep your shoulder blade tucked down toward the middle of your back. Hold for __________ seconds. Slowly release the tension, and relax your muscles completely before you repeat the exercise. Repeat __________ times. Complete this exercise __________ times a day. Internal rotation, isometric This is an exercise in which you press your palm against a door frame without moving your shoulder joint (isometric). Stand or sit in a doorway, facing the door frame. Bend your left / right elbow and place the palm of your hand against the door frame. Only your palm should be touching the frame. Keep your upper arm at your side. Gently press your hand against the door frame, as if you are trying to push your arm toward your abdomen (internal rotation). Press as hard as you are able without pain. Avoid shrugging your shoulder while you press your hand against the door frame. Keep your shoulder blade tucked down toward the middle of your back. Hold for __________ seconds. Slowly release the tension, and relax your muscles completely before you repeat the exercise. Repeat __________ times. Complete this exercise __________ times a day. Scapular protraction, supine  Lie on your back on a firm surface (supine position). Hold a __________ weight in your left / right hand. Raise your left / right arm straight into the air so your hand is directly above your shoulder joint. Push the weight into the air so your shoulder (scapula) lifts off the surface that you are lying on. The scapula  will push up or forward (protraction). Do not move your head, neck, or back. Hold for __________ seconds. Slowly return to the starting position. Let your muscles relax completely before you repeat this exercise. Repeat __________ times. Complete this exercise __________ times a day. Scapular retraction  Sit in a stable chair without armrests, or stand up. Secure an exercise band to a stable object in front of you so the band is at shoulder height. Hold one end of the exercise band in each hand. Your palms should face down. Squeeze your shoulder blades together (retraction) and move your elbows slightly behind you. Do not shrug your shoulders upward while you do this. Hold for __________ seconds. Slowly return to the starting position. Repeat __________ times. Complete this exercise __________ times a day. Shoulder extension  Sit in a stable chair without armrests, or stand up. Secure an exercise band to a stable object in front of you so the band is above shoulder height. Hold one end of the exercise band in each hand. Straighten your elbows and lift your hands up to shoulder height. Squeeze your shoulder blades together and pull your hands down to the sides of your thighs (extension). Stop when your hands are straight down by your sides. Do not let your hands go behind your body. Hold for __________ seconds. Slowly return to the starting position. Repeat __________ times. Complete this exercise __________ times a day. This information is not intended to replace advice given to you by your health care provider. Make sure you discuss any questions you have with your health care provider. Document Revised: 07/13/2018 Document Reviewed: 04/16/2018 Elsevier Patient Education  [place].

## 2022-05-17 ENCOUNTER — Ambulatory Visit: Payer: Managed Care, Other (non HMO) | Admitting: Dermatology

## 2022-05-17 VITALS — BP 162/96 | HR 78

## 2022-05-17 DIAGNOSIS — Z79899 Other long term (current) drug therapy: Secondary | ICD-10-CM

## 2022-05-17 DIAGNOSIS — L910 Hypertrophic scar: Secondary | ICD-10-CM | POA: Diagnosis not present

## 2022-05-17 DIAGNOSIS — L409 Psoriasis, unspecified: Secondary | ICD-10-CM | POA: Diagnosis not present

## 2022-05-17 MED ORDER — KETOCONAZOLE 2 % EX SHAM: 1.0000 | MEDICATED_SHAMPOO | CUTANEOUS | 3 refills | Status: AC

## 2022-05-17 MED ORDER — SKYRIZI 150 MG/ML ~~LOC~~ SOSY: 150.0000 mg | PREFILLED_SYRINGE | SUBCUTANEOUS | 3 refills | Status: AC

## 2022-05-17 NOTE — Patient Instructions (Addendum)
Recommend Serica moisturizing scar formula cream every night or [facility] brand or Mederma silicone scar sheet every night for the first year after a scar appears to help with scar remodeling if desired. Scars remodel on their own for a full year and will gradually improve in appearance over time.     Due to recent changes in healthcare laws, you may see results of your pathology and/or laboratory studies on MyChart before the doctors have had a chance to review them. We understand that in some cases there may be results that are confusing or concerning to you. Please understand that not all results are received at the same time and often the doctors may need to interpret multiple results in order to provide you with the best plan of care or course of treatment. Therefore, we ask that you please give [place] 2 business days to thoroughly review all your results before contacting the office for clarification. Should we see a critical lab result, you will be contacted sooner.   If You Need Anything After Your Visit  If you have any questions or concerns for your doctor, please call our main line at [phone] and press option 4 to reach your doctor's medical assistant. If no one answers, please leave a voicemail as directed and we will return your call as soon as possible. Messages left after 4 pm will be answered the following business day.   You may also send [place] a message via [place]. We typically respond to MyChart messages within 1-2 business days.  For prescription refills, please ask your pharmacy to contact our office. Our fax number is [phone].  If you have an urgent issue when the clinic is closed that cannot wait until the next business day, you can page your doctor at the number below.    Please note that while we do our best to be available for urgent issues outside of office hours, we are not available 24/7.   If you have an urgent issue and are unable to reach [place], you may choose to  seek medical care at your doctor's office, retail clinic, urgent care center, or emergency room.  If you have a medical emergency, please immediately call 911 or go to the emergency department.  Pager Numbers  - Dr. Nehemiah Massed: [phone]  - Dr. Laurence Ferrari: [phone]  - Dr. Nicole Kindred: [phone]  In the event of inclement weather, please call our main line at [phone] for an update on the status of any delays or closures.  Dermatology Medication Tips: Please keep the boxes that topical medications come in in order to help keep track of the instructions about where and how to use these. Pharmacies typically print the medication instructions only on the boxes and not directly on the medication tubes.   If your medication is too expensive, please contact our office at [phone] option 4 or send [place] a message through [place].   We are unable to tell what your co-pay for medications will be in advance as this is different depending on your insurance coverage. However, we may be able to find a substitute medication at lower cost or fill out paperwork to get insurance to cover a needed medication.   If a prior authorization is required to get your medication covered by your insurance company, please allow [place] 1-2 business days to complete this process.  Drug prices often vary depending on where the prescription is filled and some pharmacies may offer cheaper prices.  The website www.goodrx.com contains coupons for  medications through different pharmacies. The prices here do not account for what the cost may be with help from insurance (it may be cheaper with your insurance), but the website can give you the price if you did not use any insurance.  - You can print the associated coupon and take it with your prescription to the pharmacy.  - You may also stop by our office during regular business hours and pick up a GoodRx coupon card.  - If you need your prescription sent electronically to a  different pharmacy, notify our office through [facility] or by phone at [phone] option 4.     Si Usted Necesita Algo Despus de Su Visita  Tambin puede enviarnos un mensaje a travs de Pharmacist, community. Por lo general respondemos a los mensajes de MyChart en el transcurso de 1 a 2 das hbiles.  Para renovar recetas, por favor pida a su farmacia que se ponga en contacto con nuestra oficina. Harland Dingwall de fax es [place] [phone].  Si tiene un asunto urgente cuando la clnica est cerrada y que no puede esperar hasta el siguiente da hbil, puede llamar/localizar a su doctor(a) al nmero que aparece a continuacin.   Por favor, tenga en cuenta que aunque hacemos todo lo posible para estar disponibles para asuntos urgentes fuera del horario de [place], no estamos disponibles las 24 horas del da, los 7 das de la [place].   Si tiene un problema urgente y no puede comunicarse con nosotros, puede optar por buscar atencin mdica  en el consultorio de su doctor(a), en una clnica privada, en un centro de atencin urgente o en una sala de emergencias.  Si tiene Engineering geologist, por favor llame inmediatamente al 911 o vaya a la sala de emergencias.  Nmeros de bper  - Dr. Nehemiah Massed: [phone]  - Dra. Moye: [phone]  - Dra. Nicole Kindred: [phone]  En caso de inclemencias del [place], por favor llame a Johnsie Kindred principal al [phone] para una actualizacin sobre el [place] de cualquier retraso o cierre.  Consejos para la medicacin en dermatologa: Por favor, guarde las cajas en las que vienen los medicamentos de uso tpico para ayudarle a seguir las instrucciones sobre dnde y cmo usarlos. Las farmacias generalmente imprimen las instrucciones del medicamento slo en las cajas y no directamente en los tubos del [place].   Si su medicamento es muy caro, por favor, pngase en contacto con Zigmund Daniel llamando al [phone] y presione la opcin 4 o envenos un  mensaje a travs de Pharmacist, community.   No podemos decirle cul ser su copago por los medicamentos por adelantado ya que esto es diferente dependiendo de la cobertura de su seguro. Sin embargo, es posible que podamos encontrar un medicamento sustituto a Electrical engineer un formulario para que el seguro cubra el medicamento que se considera necesario.   Si se requiere una autorizacin previa para que su compaa de seguros [place] su medicamento, por favor permtanos de 1 a 2 das hbiles para completar este proceso.  Los precios de los medicamentos varan con frecuencia dependiendo del Environmental consultant de dnde se surte la receta y alguna farmacias pueden ofrecer precios ms baratos.  El sitio web www.goodrx.com tiene cupones para medicamentos de Airline pilot. Los precios aqu no tienen en cuenta lo que podra costar con la ayuda del seguro (puede ser ms barato con su seguro), pero el sitio web puede darle el precio si no utiliz Research scientist (physical sciences).  - Puede imprimir el cupn correspondiente y  llevarlo con su receta a la farmacia.  - Tambin puede pasar por nuestra oficina durante el horario de atencin regular y Charity fundraiser una tarjeta de cupones de GoodRx.  - Si necesita que su receta se enve electrnicamente a una farmacia diferente, informe a nuestra oficina a travs de MyChart de [place] o por telfono llamando al [phone] y presione la opcin 4.

## 2022-05-17 NOTE — Progress Notes (Signed)
   Follow-Up Visit   Subjective  Nicole Mendez is a 47 y.o. female who presents for the following: Psoriasis (6 month psoriasis follow up. Currently clear at trunk and extremities, while on skyrizi injections. ) and OTHER (Patient reports injury to left lower lip and developed thickened scar. Would like to discuss treatment. ).  No side effects.   The following portions of the chart were reviewed this encounter and updated as appropriate:      Review of Systems: No other skin or systemic complaints except as noted in HPI or Assessment and Plan.   Objective  Well appearing patient in no apparent distress; mood and affect are within normal limits.  A focused examination was performed including left lower lip, trunk and extremities . Relevant physical exam findings are noted in the Assessment and Plan.  trunk; extremities Clear today   left lateral lower lip Linear scar on left lateral lower lip   Assessment & Plan  Psoriasis trunk; extremities  Chronic condition with duration or expected duration over one year. Currently well-controlled on Skyrizi.  Needs rfs.   Counseling and coordination of care for severe psoriasis on systemic treatment  psoriasis - severe on systemic treatment.  Psoriasis is a chronic non-curable, but treatable genetic/hereditary disease that may have other systemic features affecting other organ systems such as joints (Psoriatic Arthritis).  It is linked with heart disease, inflammatory bowel disease, non-alcoholic fatty liver disease, and depression. Significant skin psoriasis and/or psoriatic arthritis may have significant symptoms and affects activities of daily activity and often benefits from systemic treatments.  These systemic treatments have some potential side effects including immunosuppression and require pre-treatment laboratory screening and periodic laboratory monitoring and periodic in person evaluation and monitoring by the attending  dermatologist physician (long term medication management).   7/23 labs reviewed, TB neg Continue Skyrizi 150 mg/mL injected into the skin q 12 wks dsp 1 1Rf.  Continue ketoconazole 2 % shampoo - apply topically once weekly. Shampoo scalp weekly, let sit 5 minutes and rinse out.   Reviewed risks of biologics including immunosuppression, infections, injection site reaction, and failure to improve condition. Goal is control of skin condition, not cure.  Some older biologics such as Humira and Enbrel may slightly increase risk of malignancy and may worsen congestive heart failure.  Talz and Cosentyx may cause inflammatory bowel disease to flare. The use of biologics requires long term medication management, including periodic office visits and monitoring of blood work.  ketoconazole (NIZORAL) 2 % shampoo - trunk; extremities Apply 1 Application topically once a week. Shampoo scalp weekly, let sit 5 minutes and rinse out  Risankizumab-rzaa (SKYRIZI) 150 MG/ML SOSY - trunk; extremities Inject 150 mg into the skin as directed. Every 12 weeks for maintenance.  QuantiFERON-TB Gold Plus - trunk; extremities  Hypertrophic scar left lateral lower lip  Due to prior injury December 2023  Recommend otc Serica scar cream bid  Discussed ILK injection in future to soften scar   Return in about 6 months (around 11/15/2022) for psoriasis.  I, Ruthell Rummage, CMA, am acting as scribe for Brendolyn Patty, MD.  Documentation: I have reviewed the above documentation for accuracy and completeness, and I agree with the above.  Brendolyn Patty MD

## 2022-06-07 ENCOUNTER — Ambulatory Visit
Admission: RE | Admit: 2022-06-07 | Discharge: 2022-06-07 | Disposition: A | Discharge status: Home / Self Care | Payer: Managed Care, Other (non HMO) | Source: Ambulatory Visit | Attending: Emergency Medicine | Admitting: Emergency Medicine

## 2022-06-07 VITALS — BP 145/90 | HR 98 | Temp 98.0°F | Resp 18

## 2022-06-07 DIAGNOSIS — I10 Essential (primary) hypertension: Secondary | ICD-10-CM | POA: Diagnosis present

## 2022-06-07 DIAGNOSIS — N898 Other specified noninflammatory disorders of vagina: Secondary | ICD-10-CM | POA: Diagnosis present

## 2022-06-07 DIAGNOSIS — Z113 Encounter for screening for infections with a predominantly sexual mode of transmission: Secondary | ICD-10-CM | POA: Insufficient documentation

## 2022-06-07 DIAGNOSIS — Z3202 Encounter for pregnancy test, result negative: Secondary | ICD-10-CM | POA: Diagnosis not present

## 2022-06-07 LAB — POCT URINALYSIS DIP (MANUAL ENTRY)
Bilirubin, UA: NEGATIVE
Blood, UA: NEGATIVE
Glucose, UA: NEGATIVE mg/dL
Ketones, POC UA: NEGATIVE mg/dL
Leukocytes, UA: NEGATIVE
Nitrite, UA: NEGATIVE
Protein Ur, POC: NEGATIVE mg/dL
Spec Grav, UA: 1.02 (ref 1.010–1.025)
Urobilinogen, UA: 1 E.U./dL
pH, UA: 7.5 (ref 5.0–8.0)

## 2022-06-07 LAB — POCT URINE PREGNANCY: Preg Test, Ur: NEGATIVE

## 2022-06-07 MED ORDER — METRONIDAZOLE 0.75 % VA GEL: 1.0000 | Freq: Every day | VAGINAL | 0 refills | Status: AC

## 2022-06-07 NOTE — ED Triage Notes (Addendum)
Patient to Urgent Care for STD testing after possible exposure.   Reports she is having some vaginal itching/ irritation. Reports having some vaginal discharge. Symptoms started approx 2 weeks ago.

## 2022-06-07 NOTE — Discharge Instructions (Addendum)
Urine is normal and urine pregnancy is negative.    Use the MetroGel as directed.     Your tests are pending.  If your test results are positive, we will call you.  You and your sexual partner(s) may require treatment at that time.  Do not have sexual activity for at least 7 days.    Follow up with your primary care provider if your symptoms are not improving.    Your blood pressure is elevated today at 145/90.  Please have this rechecked by your primary care provider in 2-4 weeks.

## 2022-06-07 NOTE — ED Provider Notes (Signed)
Nicole Mendez    CSN: [id number] Arrival date & time: 06/07/22  1554      History   Chief Complaint Chief Complaint  Patient presents with   Exposure to STD    Entered by patient    HPI Nicole Mendez is a 47 y.o. female.  Patient presents with 2 week history of vaginal discharge, itching, and irritation.  She states this is similar to previous bacterial vaginitis.  She requests STD testing.  She denies fever, chills, rash, abdominal pain, dysuria, hematuria, flank pain, vomiting, diarrhea, constipation, pelvic pain, or other symptoms.  Her medical history includes hypertension and diabetes.   The history is provided by the patient and medical records.    Past Medical History:  Diagnosis Date   Alcohol abuse    Arthritis    Cervical radiculopathy    COVID-19    Diabetes mellitus without complication ([place]) [id number]   Headache    Herpes    1 and 2 since 47 y.o    Hyperlipidemia    Hypertension    Morbid obesity ([place])    OSA (obstructive sleep apnea)    Not on CPAP   Psoriasis    Tobacco abuse     Patient Active Problem List   Diagnosis Date Noted   Acute non-recurrent sinusitis 01/24/2022   Onychomycosis 01/12/2022   Tinea pedis of both feet 01/12/2022   Class 3 severe obesity due to excess calories with serious comorbidity and body mass index (BMI) of 40.0 to 44.9 in adult ([place]) 10/11/2021   Blood in stool 10/11/2021   Positive RPR test 10/11/2021   Obesity, diabetes, and hypertension syndrome ([place]) 02/08/2021   Rebound headache 01/04/2021   Iron deficiency anemia 08/19/2020   Vitamin D deficiency 08/19/2020   Chronic migraine w/o aura w/o status migrainosus, not intractable 05/14/2020   Arthritis of left knee 03/17/2020   Hypertension associated with diabetes ([place]) 10/03/2019   Trigger finger of left thumb 05/31/2019   Cellulitis of left breast 05/31/2019   GAD (generalized anxiety disorder) 02/21/2019   Prediabetes 02/21/2019   Plantar fasciitis  of right foot 01/30/2018   GERD (gastroesophageal reflux disease) 08/24/2017   Acute pain of left shoulder 03/20/2017   Cervical radiculopathy 03/20/2017   Anemia 08/22/2016   Annual physical exam 02/02/2016   Chronic headache 08/20/2015   Psoriasis    Alcohol abuse    Chronic right shoulder pain 07/19/2015   Cervical radiculopathy at C6 07/19/2015   Trapezius strain 04/21/2015   Essential hypertension 08/20/2014   OSA on CPAP 08/20/2014   Type 2 diabetes mellitus ([place]) 08/20/2014   Mass of left breast 08/15/2008   Hyperlipidemia 01/10/2008   BMI 40.0-44.9, adult ([place]) 01/10/2008   Bacterial vaginosis 12/28/2007   PSORIASIS 12/17/2007   ALLERGIC RHINITIS 09/24/2007    Past Surgical History:  Procedure Laterality Date   No past surgery.      OB History     Gravida  0   Para  0   Term  0   Preterm  0   AB  0   Living  0      SAB  0   IAB  0   Ectopic  0   Multiple  0   Live Births               Home Medications    Prior to Admission medications   Medication Sig Start Date End Date Taking? Authorizing Provider  metroNIDAZOLE (METROGEL) 0.75 % vaginal  gel Place 1 Applicatorful vaginally at bedtime for 5 days. 06/07/22 06/12/22 Yes Sharion Balloon, NP  acetaminophen (TYLENOL) 650 MG CR tablet Take 650 mg by mouth every 8 (eight) hours as needed for pain.     [provider]  albuterol (VENTOLIN HFA) 108 (90 Base) MCG/ACT inhaler Inhale 1-2 puffs into the lungs every 4 (four) hours as needed for wheezing or shortness of breath. 10/11/21   McLean-Scocuzza, Nino Glow, MD  amLODipine (NORVASC) 10 MG tablet Take 1 tablet (10 mg total) by mouth daily. 10/11/21   McLean-Scocuzza, Nino Glow, MD  amoxicillin-clavulanate (AUGMENTIN) 875-125 MG tablet Take 1 tablet by mouth 2 (two) times daily. Patient not taking: Reported on 06/07/2022 01/24/22   Leone Haven, MD  aspirin 81 MG tablet Take 81 mg by mouth daily.    [provider]  Cholecalciferol  1.25 MG (50000 UT) capsule Take 1 capsule (50,000 Units total) by mouth once a week. 12/02/21   McLean-Scocuzza, Nino Glow, MD  clotrimazole-betamethasone (LOTRISONE) cream Apply 1 Application topically daily. Spot treat foot itching 01/12/22   McLean-Scocuzza, Nino Glow, MD  cyclobenzaprine (FLEXERIL) 5 MG tablet Take 1 tablet (5 mg total) by mouth daily as needed for muscle spasms. 10/11/21   McLean-Scocuzza, Nino Glow, MD  ketoconazole (NIZORAL) 2 % shampoo Apply 1 Application topically once a week. Shampoo scalp weekly, let sit 5 minutes and rinse out 05/17/22   Brendolyn Patty, MD  lisinopril (ZESTRIL) 40 MG tablet Take 1 tablet (40 mg total) by mouth daily. 10/11/21   McLean-Scocuzza, Nino Glow, MD  metFORMIN (GLUCOPHAGE) 500 MG tablet Take 1 tablet (500 mg total) by mouth 2 (two) times daily with a meal. 10/11/21   McLean-Scocuzza, Nino Glow, MD  metoprolol succinate (TOPROL-XL) 25 MG 24 hr tablet Take 1 tablet (25 mg total) by mouth daily. At night 10/11/21   McLean-Scocuzza, Nino Glow, MD  ondansetron (ZOFRAN ODT) 4 MG disintegrating tablet Take 1 tablet (4 mg total) by mouth every 8 (eight) hours as needed for nausea or vomiting. 10/11/21   McLean-Scocuzza, Nino Glow, MD  pravastatin (PRAVACHOL) 40 MG tablet Take 1 tablet (40 mg total) by mouth daily. 10/11/21   McLean-Scocuzza, Nino Glow, MD  Risankizumab-rzaa [facility]) 150 MG/ML SOSY Inject 150 mg into the skin as directed. Every 12 weeks for maintenance. 05/17/22   Brendolyn Patty, MD  rizatriptan (MAXALT-MLT) 10 MG disintegrating tablet Take 1 tablet (10 mg total) by mouth as needed for migraine. May repeat in 2 hours if needed 10/11/21   McLean-Scocuzza, Nino Glow, MD  spironolactone (ALDACTONE) 50 MG tablet Take 1 tablet (50 mg total) by mouth daily. In am 10/11/21   McLean-Scocuzza, Nino Glow, MD  terbinafine (LAMISIL AT) 1 % cream Apply 1 Application topically 2 (two) times daily. Feet and in between toes 01/12/22   McLean-Scocuzza, Nino Glow, MD  terbinafine (LAMISIL)  250 MG tablet Take 1 tablet (250 mg total) by mouth daily. 01/12/22   McLean-Scocuzza, Nino Glow, MD  valACYclovir (VALTREX) 1000 MG tablet TAKE 1 TABLET BY MOUTH TWICE DAILY FOR 5 DAYS AS NEEDED 10/11/21   McLean-Scocuzza, Nino Glow, MD  venlafaxine XR (EFFEXOR XR) 150 MG 24 hr capsule Take 1 capsule (150 mg total) by mouth daily with breakfast. 10/11/21   McLean-Scocuzza, Nino Glow, MD    Family History Family History  Problem Relation Age of Onset   Alcohol abuse Maternal Grandmother    Cancer Maternal Grandmother        Ovarian   Hypertension  Maternal Grandmother    Diabetes Maternal Grandmother    Hypertension Mother    Diabetes Mother    Colon cancer Other 45   Breast cancer Other        + FH   Aneurysm Maternal Grandfather    Alcohol abuse Maternal Grandfather    Other Father        unsure of medical history    Social History Social History   Tobacco Use   Smoking status: Former    Packs/day: 0.50    Types: Cigarettes   Smokeless tobacco: Never   Tobacco comments:    stopped 11/2015  Vaping Use   Vaping Use: Never used  Substance Use Topics   Alcohol use: Yes    Comment: occasionally   Drug use: No     Allergies   Patient has no known allergies.   Review of Systems Review of Systems  Constitutional:  Negative for chills and fever.  Gastrointestinal:  Negative for abdominal pain, constipation, diarrhea, nausea and vomiting.  Genitourinary:  Positive for vaginal discharge. Negative for dysuria, flank pain, hematuria and pelvic pain.  Skin:  Negative for rash.  All other systems reviewed and are negative.    Physical Exam Triage Vital Signs ED Triage Vitals [06/07/22 1609]  Enc Vitals Group     BP      Pulse Rate 98     Resp 18     Temp 98 F (36.7 C)     Temp src      SpO2 97 %     Weight      Height      Head Circumference      Peak Flow      Pain Score      Pain Loc      Pain Edu?      Excl. in [place]?    No data found.  Updated Vital Signs BP  (!) 145/90   Pulse 98   Temp 98 F (36.7 C)   Resp 18   LMP 05/17/2022   SpO2 97%   Visual Acuity Right Eye Distance:   Left Eye Distance:   Bilateral Distance:    Right Eye Near:   Left Eye Near:    Bilateral Near:     Physical Exam Vitals and nursing note reviewed.  Constitutional:      General: She is not in acute distress.    Appearance: She is well-developed. She is not ill-appearing.  HENT:     Mouth/Throat:     Mouth: Mucous membranes are moist.  Cardiovascular:     Rate and Rhythm: Normal rate and regular rhythm.     Heart sounds: Normal heart sounds.  Pulmonary:     Effort: Pulmonary effort is normal. No respiratory distress.     Breath sounds: Normal breath sounds.  Abdominal:     General: Bowel sounds are normal.     Palpations: Abdomen is soft.     Tenderness: There is no abdominal tenderness. There is no right CVA tenderness, left CVA tenderness, guarding or rebound.  Genitourinary:    Comments: Patient declines GU exam. Musculoskeletal:     Cervical back: Neck supple.  Skin:    General: Skin is warm and dry.  Neurological:     Mental Status: She is alert.  Psychiatric:        Mood and Affect: Mood normal.        Behavior: Behavior normal.      UC Treatments / Results  Labs (all labs ordered are listed, but only abnormal results are displayed) Labs Reviewed  RPR  HIV ANTIBODY (ROUTINE TESTING W REFLEX)  POCT URINE PREGNANCY  POCT URINALYSIS DIP (MANUAL ENTRY)  CERVICOVAGINAL ANCILLARY ONLY    EKG   Radiology No results found.  Procedures Procedures (including critical care time)  Medications Ordered in UC Medications - No data to display  Initial Impression / Assessment and Plan / UC Course  I have reviewed the triage vital signs and the nursing notes.  Pertinent labs & imaging results that were available during my care of the patient were reviewed by me and considered in my medical decision making (see chart for details).     Vaginal discharge, STD screening, negative pregnancy test, Elevated blood pressure with HTN.  Urine negative; urine pregnancy negative.  Patient obtained vaginal self swab for testing.  Treating with MetroGel per patient preference.  Discussed that we will call if test results are positive.  Discussed that she may require treatment at that time.  Discussed that sexual partner(s) may also require treatment.  Instructed patient to abstain from sexual activity for at least 7 days.  Instructed her to follow-up with her PCP or gynecologist if her symptoms are not improving.  Also discussed with patient that her blood pressure is elevated today and needs to be rechecked by PCP in 2 to 4 weeks.  Education provided on managing hypertension.  Patient agrees to plan of care.   Final Clinical Impressions(s) / UC Diagnoses   Final diagnoses:  Vaginal discharge  Screening for STD (sexually transmitted disease)  Negative pregnancy test  Elevated blood pressure reading in office with diagnosis of hypertension     Discharge Instructions      Urine is normal and urine pregnancy is negative.    Use the MetroGel as directed.     Your tests are pending.  If your test results are positive, we will call you.  You and your sexual partner(s) may require treatment at that time.  Do not have sexual activity for at least 7 days.    Follow up with your primary care provider if your symptoms are not improving.    Your blood pressure is elevated today at 145/90.  Please have this rechecked by your primary care provider in 2-4 weeks.          ED Prescriptions     Medication Sig Dispense Auth. Provider   metroNIDAZOLE (METROGEL) 0.75 % vaginal gel Place 1 Applicatorful vaginally at bedtime for 5 days. 50 g Sharion Balloon, NP      PDMP not reviewed this encounter.   Sharion Balloon, NP 06/07/22 [phone]

## 2022-06-08 LAB — CERVICOVAGINAL ANCILLARY ONLY
Bacterial Vaginitis (gardnerella): POSITIVE — AB
Candida Glabrata: NEGATIVE
Candida Vaginitis: POSITIVE — AB
Chlamydia: NEGATIVE
Comment: NEGATIVE
Comment: NEGATIVE
Comment: NEGATIVE
Comment: NEGATIVE
Comment: NEGATIVE
Comment: NORMAL
Neisseria Gonorrhea: NEGATIVE
Trichomonas: NEGATIVE

## 2022-06-08 LAB — RPR: RPR Ser Ql: NONREACTIVE

## 2022-06-08 LAB — HIV ANTIBODY (ROUTINE TESTING W REFLEX): HIV Screen 4th Generation wRfx: NONREACTIVE

## 2022-06-09 ENCOUNTER — Telehealth: Payer: Self-pay | Admitting: Emergency Medicine

## 2022-06-09 MED ORDER — FLUCONAZOLE 150 MG PO TABS: 150.0000 mg | ORAL_TABLET | Freq: Once | ORAL | 0 refills | Status: AC

## 2022-07-19 ENCOUNTER — Encounter: Payer: Managed Care, Other (non HMO) | Admitting: Family Medicine

## 2022-07-29 ENCOUNTER — Encounter: Payer: Self-pay | Admitting: Gastroenterology

## 2022-08-04 ENCOUNTER — Encounter: Payer: Self-pay | Admitting: Gastroenterology

## 2022-08-05 ENCOUNTER — Ambulatory Visit
Admission: RE | Admit: 2022-08-05 | Payer: Managed Care, Other (non HMO) | Source: Home / Self Care | Admitting: Gastroenterology

## 2022-08-05 ENCOUNTER — Encounter: Admission: RE | Payer: Self-pay | Source: Home / Self Care

## 2022-08-05 HISTORY — DX: Body Mass Index (BMI) 40.0 and over, adult: Z684

## 2022-08-05 SURGERY — COLONOSCOPY
Anesthesia: General

## 2022-08-10 ENCOUNTER — Ambulatory Visit: Payer: Managed Care, Other (non HMO) | Admitting: Dermatology

## 2022-08-10 VITALS — BP 169/97 | HR 77

## 2022-08-10 DIAGNOSIS — L910 Hypertrophic scar: Secondary | ICD-10-CM | POA: Diagnosis not present

## 2022-08-10 NOTE — Patient Instructions (Addendum)
Intralesional steroid injection side effects were reviewed including thinning of the skin and discoloration, such as redness, lightening or darkening.  Due to recent changes in healthcare laws, you may see results of your pathology and/or laboratory studies on MyChart before the doctors have had a chance to review them. We understand that in some cases there may be results that are confusing or concerning to you. Please understand that not all results are received at the same time and often the doctors may need to interpret multiple results in order to provide you with the best plan of care or course of treatment. Therefore, we ask that you please give us 2 business days to thoroughly review all your results before contacting the office for clarification. Should we see a critical lab result, you will be contacted sooner.   If You Need Anything After Your Visit  If you have any questions or concerns for your doctor, please call our main line at [phone] and press option 4 to reach your doctor's medical assistant. If no one answers, please leave a voicemail as directed and we will return your call as soon as possible. Messages left after 4 pm will be answered the following business day.   You may also send us a message via MyChart. We typically respond to MyChart messages within 1-2 business days.  For prescription refills, please ask your pharmacy to contact our office. Our fax number is [phone].  If you have an urgent issue when the clinic is closed that cannot wait until the next business day, you can page your doctor at the number below.    Please note that while we do our best to be available for urgent issues outside of office hours, we are not available 24/7.   If you have an urgent issue and are unable to reach us, you may choose to seek medical care at your doctor's office, retail clinic, urgent care center, or emergency room.  If you have a medical emergency, please immediately  call 911 or go to the emergency department.  Pager Numbers  - Dr. Kowalski: [phone]  - Dr. Moye: [phone]  - Dr. Stewart: [phone]  In the event of inclement weather, please call our main line at [phone] for an update on the status of any delays or closures.  Dermatology Medication Tips: Please keep the boxes that topical medications come in in order to help keep track of the instructions about where and how to use these. Pharmacies typically print the medication instructions only on the boxes and not directly on the medication tubes.   If your medication is too expensive, please contact our office at [phone] option 4 or send us a message through MyChart.   We are unable to tell what your co-pay for medications will be in advance as this is different depending on your insurance coverage. However, we may be able to find a substitute medication at lower cost or fill out paperwork to get insurance to cover a needed medication.   If a prior authorization is required to get your medication covered by your insurance company, please allow us 1-2 business days to complete this process.  Drug prices often vary depending on where the prescription is filled and some pharmacies may offer cheaper prices.  The website www.goodrx.com contains coupons for medications through different pharmacies. The prices here do not account for what the cost may be with help from insurance (it may be cheaper with your insurance), but the website can give you   the price if you did not use any insurance.  - You can print the associated coupon and take it with your prescription to the pharmacy.  - You may also stop by our office during regular business hours and pick up a GoodRx coupon card.  - If you need your prescription sent electronically to a different pharmacy, notify our office through [facility] MyChart or by phone at [phone] option 4.     Si Usted Necesita Algo Despus de Su  Visita  Tambin puede enviarnos un mensaje a travs de MyChart. Por lo general respondemos a los mensajes de MyChart en el transcurso de 1 a 2 das hbiles.  Para renovar recetas, por favor pida a su farmacia que se ponga en contacto con nuestra oficina. Nuestro nmero de fax es el [phone].  Si tiene un asunto urgente cuando la clnica est cerrada y que no puede esperar hasta el siguiente da hbil, puede llamar/localizar a su doctor(a) al nmero que aparece a continuacin.   Por favor, tenga en cuenta que aunque hacemos todo lo posible para estar disponibles para asuntos urgentes fuera del horario de oficina, no estamos disponibles las 24 horas del da, los 7 das de la semana.   Si tiene un problema urgente y no puede comunicarse con nosotros, puede optar por buscar atencin mdica  en el consultorio de su doctor(a), en una clnica privada, en un centro de atencin urgente o en una sala de emergencias.  Si tiene una emergencia mdica, por favor llame inmediatamente al 911 o vaya a la sala de emergencias.  Nmeros de bper  - Dr. Kowalski: [phone]  - Dra. Moye: [phone]  - Dra. Stewart: [phone]  En caso de inclemencias del tiempo, por favor llame a nuestra lnea principal al [phone] para una actualizacin sobre el estado de cualquier retraso o cierre.  Consejos para la medicacin en dermatologa: Por favor, guarde las cajas en las que vienen los medicamentos de uso tpico para ayudarle a seguir las instrucciones sobre dnde y cmo usarlos. Las farmacias generalmente imprimen las instrucciones del medicamento slo en las cajas y no directamente en los tubos del medicamento.   Si su medicamento es muy caro, por favor, pngase en contacto con nuestra oficina llamando al [phone] y presione la opcin 4 o envenos un mensaje a travs de MyChart.   No podemos decirle cul ser su copago por los medicamentos por adelantado ya que esto es diferente dependiendo de la  cobertura de su seguro. Sin embargo, es posible que podamos encontrar un medicamento sustituto a menor costo o llenar un formulario para que el seguro cubra el medicamento que se considera necesario.   Si se requiere una autorizacin previa para que su compaa de seguros cubra su medicamento, por favor permtanos de 1 a 2 das hbiles para completar este proceso.  Los precios de los medicamentos varan con frecuencia dependiendo del lugar de dnde se surte la receta y alguna farmacias pueden ofrecer precios ms baratos.  El sitio web www.goodrx.com tiene cupones para medicamentos de diferentes farmacias. Los precios aqu no tienen en cuenta lo que podra costar con la ayuda del seguro (puede ser ms barato con su seguro), pero el sitio web puede darle el precio si no utiliz ningn seguro.  - Puede imprimir el cupn correspondiente y llevarlo con su receta a la farmacia.  - Tambin puede pasar por nuestra oficina durante el horario de atencin regular y recoger una tarjeta de cupones de GoodRx.  - Si necesita   que su receta se enve electrnicamente a una farmacia diferente, informe a nuestra oficina a travs de MyChart de [facility] o por telfono llamando al [phone] y presione la opcin 4.  

## 2022-08-10 NOTE — Progress Notes (Signed)
   Follow-Up Visit   Subjective  Nicole Mendez is a 47 y.o. female who presents for the following: Scar (Left lateral lower lip, secondary to injury in Dec 2023. Patient tried OTC but didn't help. She would like to have ILK injections today. ).    The following portions of the chart were reviewed this encounter and updated as appropriate:       Review of Systems:  No other skin or systemic complaints except as noted in HPI or Assessment and Plan.  Objective  Well appearing patient in no apparent distress; mood and affect are within normal limits.  A focused examination was performed including face. Relevant physical exam findings are noted in the Assessment and Plan.  Left Lower Vermilion Lip Firm pink/white Linear scar on left lateral lower lip        Assessment & Plan  Hypertrophic scar Left Lower Vermilion Lip  Due to prior injury December 2023  Symptomatic, irritating, patient would like treated.   Intralesional steroid injection side effects were reviewed including thinning of the skin and discoloration, such as redness, lightening or darkening.   Intralesional injection - Left Lower Vermilion Lip Location: left lateral lower lip  Informed Consent: Discussed risks (infection, pain, bleeding, bruising, thinning of the skin, loss of skin pigment, lack of resolution, and recurrence of lesion) and benefits of the procedure, as well as the alternatives. Informed consent was obtained. Preparation: The area was prepared a standard fashion.  Procedure Details: An intralesional injection was performed with Kenalog 10 mg/cc. 0.05 cc in total were injected.  Total number of injections: <7  Plan: The patient was instructed on post-op care. Recommend OTC analgesia as needed for pain.  Lot [id number] Exp 06/2024 NDC 4540-9811-91   Return in about 6 weeks (around 09/21/2022) for scar, IL injection.  ICherlyn Labella, CMA, am acting as scribe for Willeen Niece, MD  .  Documentation: I have reviewed the above documentation for accuracy and completeness, and I agree with the above.  Willeen Niece MD

## 2022-08-22 ENCOUNTER — Telehealth: Payer: Self-pay

## 2022-08-22 NOTE — Telephone Encounter (Signed)
Pt needs an appointment with Dr. Clent Ridges for refills.

## 2022-09-01 ENCOUNTER — Other Ambulatory Visit: Payer: Self-pay | Admitting: Family Medicine

## 2022-09-19 ENCOUNTER — Ambulatory Visit: Payer: Managed Care, Other (non HMO) | Admitting: Dermatology

## 2022-09-19 VITALS — BP 188/80 | HR 82

## 2022-09-19 DIAGNOSIS — L910 Hypertrophic scar: Secondary | ICD-10-CM

## 2022-09-19 DIAGNOSIS — L409 Psoriasis, unspecified: Secondary | ICD-10-CM

## 2022-09-19 DIAGNOSIS — L988 Other specified disorders of the skin and subcutaneous tissue: Secondary | ICD-10-CM | POA: Diagnosis not present

## 2022-09-19 NOTE — Patient Instructions (Addendum)
Intralesional steroid injection side effects were reviewed including thinning of the skin and discoloration, such as redness, lightening or darkening.  Due to recent changes in healthcare laws, you may see results of your pathology and/or laboratory studies on MyChart before the doctors have had a chance to review them. We understand that in some cases there may be results that are confusing or concerning to you. Please understand that not all results are received at the same time and often the doctors may need to interpret multiple results in order to provide you with the best plan of care or course of treatment. Therefore, we ask that you please give us 2 business days to thoroughly review all your results before contacting the office for clarification. Should we see a critical lab result, you will be contacted sooner.   If You Need Anything After Your Visit  If you have any questions or concerns for your doctor, please call our main line at [phone] and press option 4 to reach your doctor's medical assistant. If no one answers, please leave a voicemail as directed and we will return your call as soon as possible. Messages left after 4 pm will be answered the following business day.   You may also send us a message via MyChart. We typically respond to MyChart messages within 1-2 business days.  For prescription refills, please ask your pharmacy to contact our office. Our fax number is [phone].  If you have an urgent issue when the clinic is closed that cannot wait until the next business day, you can page your doctor at the number below.    Please note that while we do our best to be available for urgent issues outside of office hours, we are not available 24/7.   If you have an urgent issue and are unable to reach us, you may choose to seek medical care at your doctor's office, retail clinic, urgent care center, or emergency room.  If you have a medical emergency, please immediately  call 911 or go to the emergency department.  Pager Numbers  - Dr. Kowalski: [phone]  - Dr. Moye: [phone]  - Dr. Stewart: [phone]  In the event of inclement weather, please call our main line at [phone] for an update on the status of any delays or closures.  Dermatology Medication Tips: Please keep the boxes that topical medications come in in order to help keep track of the instructions about where and how to use these. Pharmacies typically print the medication instructions only on the boxes and not directly on the medication tubes.   If your medication is too expensive, please contact our office at [phone] option 4 or send us a message through MyChart.   We are unable to tell what your co-pay for medications will be in advance as this is different depending on your insurance coverage. However, we may be able to find a substitute medication at lower cost or fill out paperwork to get insurance to cover a needed medication.   If a prior authorization is required to get your medication covered by your insurance company, please allow us 1-2 business days to complete this process.  Drug prices often vary depending on where the prescription is filled and some pharmacies may offer cheaper prices.  The website www.goodrx.com contains coupons for medications through different pharmacies. The prices here do not account for what the cost may be with help from insurance (it may be cheaper with your insurance), but the website can give you   the price if you did not use any insurance.  - You can print the associated coupon and take it with your prescription to the pharmacy.  - You may also stop by our office during regular business hours and pick up a GoodRx coupon card.  - If you need your prescription sent electronically to a different pharmacy, notify our office through [facility] MyChart or by phone at [phone] option 4.     Si Usted Necesita Algo Despus de Su  Visita  Tambin puede enviarnos un mensaje a travs de MyChart. Por lo general respondemos a los mensajes de MyChart en el transcurso de 1 a 2 das hbiles.  Para renovar recetas, por favor pida a su farmacia que se ponga en contacto con nuestra oficina. Nuestro nmero de fax es el [phone].  Si tiene un asunto urgente cuando la clnica est cerrada y que no puede esperar hasta el siguiente da hbil, puede llamar/localizar a su doctor(a) al nmero que aparece a continuacin.   Por favor, tenga en cuenta que aunque hacemos todo lo posible para estar disponibles para asuntos urgentes fuera del horario de oficina, no estamos disponibles las 24 horas del da, los 7 das de la semana.   Si tiene un problema urgente y no puede comunicarse con nosotros, puede optar por buscar atencin mdica  en el consultorio de su doctor(a), en una clnica privada, en un centro de atencin urgente o en una sala de emergencias.  Si tiene una emergencia mdica, por favor llame inmediatamente al 911 o vaya a la sala de emergencias.  Nmeros de bper  - Dr. Kowalski: [phone]  - Dra. Moye: [phone]  - Dra. Stewart: [phone]  En caso de inclemencias del tiempo, por favor llame a nuestra lnea principal al [phone] para una actualizacin sobre el estado de cualquier retraso o cierre.  Consejos para la medicacin en dermatologa: Por favor, guarde las cajas en las que vienen los medicamentos de uso tpico para ayudarle a seguir las instrucciones sobre dnde y cmo usarlos. Las farmacias generalmente imprimen las instrucciones del medicamento slo en las cajas y no directamente en los tubos del medicamento.   Si su medicamento es muy caro, por favor, pngase en contacto con nuestra oficina llamando al [phone] y presione la opcin 4 o envenos un mensaje a travs de MyChart.   No podemos decirle cul ser su copago por los medicamentos por adelantado ya que esto es diferente dependiendo de la  cobertura de su seguro. Sin embargo, es posible que podamos encontrar un medicamento sustituto a menor costo o llenar un formulario para que el seguro cubra el medicamento que se considera necesario.   Si se requiere una autorizacin previa para que su compaa de seguros cubra su medicamento, por favor permtanos de 1 a 2 das hbiles para completar este proceso.  Los precios de los medicamentos varan con frecuencia dependiendo del lugar de dnde se surte la receta y alguna farmacias pueden ofrecer precios ms baratos.  El sitio web www.goodrx.com tiene cupones para medicamentos de diferentes farmacias. Los precios aqu no tienen en cuenta lo que podra costar con la ayuda del seguro (puede ser ms barato con su seguro), pero el sitio web puede darle el precio si no utiliz ningn seguro.  - Puede imprimir el cupn correspondiente y llevarlo con su receta a la farmacia.  - Tambin puede pasar por nuestra oficina durante el horario de atencin regular y recoger una tarjeta de cupones de GoodRx.  - Si necesita   que su receta se enve electrnicamente a una farmacia diferente, informe a nuestra oficina a travs de MyChart de [facility] o por telfono llamando al [phone] y presione la opcin 4.  

## 2022-09-19 NOTE — Progress Notes (Signed)
Follow-Up Visit   Subjective  Nicole Mendez is a 47 y.o. female who presents for the following: Patient here for follow up on hypertrophic scar at left lower lip. Has improved after last intralesional injection. Patient would also like to discuss refills of skyrizi for history psoriasis.   The following portions of the chart were reviewed this encounter and updated as appropriate: medications, allergies, medical history  Review of Systems:  No other skin or systemic complaints except as noted in HPI or Assessment and Plan.  Objective  Well appearing patient in no apparent distress; mood and affect are within normal limits.   A focused examination was performed of the following areas: Left lower vermilion lip and extremities , face  Relevant exam findings are noted in the Assessment and Plan.    Assessment & Plan   FACIAL ELASTOSIS Exam: Rhytides and volume loss.  Treatment Plan: Discussed treatment for crows feet  Recommend Botox 5 units per side $130, may need increase # of units, depending on response.  Last 3-4 months.   Recommend daily broad spectrum sunscreen SPF 30+ to sun-exposed areas, reapply every 2 hours as needed. Call for new or changing lesions.  Staying in the shade or wearing long sleeves, sun glasses (UVA+UVB protection) and wide brim hats (4-inch brim around the entire circumference of the hat) are also recommended for sun protection.    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  Improving with ILK injections  Treatment Plan:  Procedure Note Intralesional Injection  Location: left lower vermillion lip   Informed Consent: Discussed risks (infection, pain, bleeding, bruising, thinning of the skin, loss of skin pigment, lack of resolution, and recurrence of lesion) and benefits of the procedure, as well as the alternatives. Informed consent was obtained. Preparation: The area  was prepared a standard fashion.  Anesthesia: none  Procedure Details: An intralesional injection was performed with Kenalog 5 mg/cc. 0.05 cc in total were injected. NDC #: 1610-9604-54 Exp: 06/2024 Lot [id number]  Total number of injections: 3  Plan: The patient was instructed on post-op care. Recommend OTC analgesia as needed for pain.     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 Currently well-controlled on Skyrizi.   Psoriasis - severe on systemic treatment.  Psoriasis is a chronic non-curable, but treatable genetic/hereditary disease that may have other systemic features affecting other organ systems such as joints (Psoriatic Arthritis).  It is linked with heart disease, inflammatory bowel disease, non-alcoholic fatty liver disease, and depression. Significant skin psoriasis and/or psoriatic arthritis may have significant symptoms and affects activities of daily activity and often benefits from systemic treatments.  These systemic treatments have some potential side effects including immunosuppression and require pre-treatment laboratory screening and periodic laboratory monitoring and periodic in person evaluation and monitoring by the attending dermatologist physician (long term medication management).   Reviewed risks of biologics including immunosuppression, infections, injection site reaction, and failure to improve condition. Goal is control of skin condition, not cure.  Some older biologics such as Humira and Enbrel may slightly increase risk of malignancy and may worsen congestive heart failure.  Taltz and Cosentyx may cause inflammatory bowel disease to flare. The use of biologics requires long term medication management, including periodic office visits and monitoring of blood  work.   Treatment Plan: Continue  Skyrizi 150 mg/mL injected into the skin q 12 wks.  Discussed with pt that rfs were sent in last visit, and she should call back if they are not showing up at the specialty pharmacy [facility]   Continue ketoconazole 2 % shampoo - apply topically once weekly. Shampoo scalp weekly, let sit 5 minutes and rinse out.  Psoriasis  Related Medications ketoconazole (NIZORAL) 2 % shampoo Apply 1 Application topically once a week. Shampoo scalp weekly, let sit 5 minutes and rinse out  Risankizumab-rzaa (SKYRIZI) 150 MG/ML SOSY Inject 150 mg into the skin as directed. Every 12 weeks for maintenance.    Return for keep follow up as scheduled in August .  I, Asher Muir, CMA, am acting as scribe for Willeen Niece, MD.   Documentation: I have reviewed the above documentation for accuracy and completeness, and I agree with the above.  Willeen Niece, MD

## 2022-11-02 ENCOUNTER — Ambulatory Visit: Payer: Managed Care, Other (non HMO) | Admitting: Neurology

## 2022-11-15 ENCOUNTER — Ambulatory Visit: Payer: Managed Care, Other (non HMO) | Admitting: Dermatology

## 2022-11-15 VITALS — BP 168/87

## 2022-11-15 DIAGNOSIS — Z79899 Other long term (current) drug therapy: Secondary | ICD-10-CM | POA: Diagnosis not present

## 2022-11-15 DIAGNOSIS — L910 Hypertrophic scar: Secondary | ICD-10-CM | POA: Diagnosis not present

## 2022-11-15 DIAGNOSIS — L409 Psoriasis, unspecified: Secondary | ICD-10-CM | POA: Diagnosis not present

## 2022-11-15 DIAGNOSIS — Z7189 Other specified counseling: Secondary | ICD-10-CM

## 2022-11-15 DIAGNOSIS — L723 Sebaceous cyst: Secondary | ICD-10-CM | POA: Diagnosis not present

## 2022-11-15 MED ORDER — TRIAMCINOLONE ACETONIDE 10 MG/ML IJ SUSP
5.0000 mg | Freq: Once | INTRAMUSCULAR | Status: AC
  Administered 2022-11-15: 5 mg

## 2022-11-15 MED ORDER — DOXYCYCLINE MONOHYDRATE 100 MG PO CAPS: 100.0000 mg | ORAL_CAPSULE | Freq: Two times a day (BID) | ORAL | 0 refills | Status: AC

## 2022-11-15 NOTE — Patient Instructions (Signed)

## 2022-11-15 NOTE — Progress Notes (Signed)
Follow-Up Visit   Subjective  Nicole Mendez is a 47 y.o. female who presents for the following: Psoriasis, trunk, extremities, 16m f/u, Skyrizi sq injections q 12 wks, Ketoconazole 2% shampoo scalp, hypertrophic scar, lower lip, IL injections L lower vermillion lip, check spot L upper lip- bump came up couple days ago.   The following portions of the chart were reviewed this encounter and updated as appropriate: medications, allergies, medical history  Review of Systems:  No other skin or systemic complaints except as noted in HPI or Assessment and Plan.  Objective  Well appearing patient in no apparent distress; mood and affect are within normal limits.  Areas Examined: Face, arms  Relevant exam findings are noted in the Assessment and Plan.      Assessment & Plan   Hypertrophic scar  Related Medications triamcinolone acetonide (KENALOG) 10 MG/ML injection 5 mg     PSORIASIS Trunk, extremities clear today <1% BSA on treatment  Chronic condition with duration or expected duration over one year. Currently well-controlled.   Counseling and coordination of care for severe psoriasis on systemic treatment  Psoriasis - severe on systemic treatment.  Psoriasis is a chronic non-curable, but treatable genetic/hereditary disease that may have other systemic features affecting other organ systems such as joints (Psoriatic Arthritis).  It is linked with heart disease, inflammatory bowel disease, non-alcoholic fatty liver disease, and depression. Significant skin psoriasis and/or psoriatic arthritis may have significant symptoms and affects activities of daily activity and often benefits from systemic treatments.  These systemic treatments have some potential side effects including immunosuppression and require pre-treatment laboratory screening and periodic laboratory monitoring and periodic in person evaluation and monitoring by the attending dermatologist physician (long term  medication management).    Treatment Plan: Cont Ketoconazole 2% shampoo 1x/wk, let sit 5 minutes and rinse out Cont Skyrizi sq injections q 12 wks, will send refills once we get TB results Prescription for TB test given to patient so she can get at work [facility])  Reviewed risks of biologics including immunosuppression, infections, injection site reaction, and failure to improve condition. Goal is control of skin condition, not cure.  Some older biologics such as Humira and Enbrel may slightly increase risk of malignancy and may worsen congestive heart failure.  Taltz and Cosentyx may cause inflammatory bowel disease to flare. The use of biologics requires long term medication management, including periodic office visits and monitoring of blood work.   Long term medication management.  Patient is using long term (months to years) prescription medication  to control their dermatologic condition.  These medications require periodic monitoring to evaluate for efficacy and side effects and may require periodic laboratory monitoring.    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  Treatment Plan:  Treat with intralesional triamcinolone.   Procedure Note Intralesional Injection Informed Consent: Discussed risks (infection, pain, bleeding, bruising, thinning of the skin, loss of skin pigment, lack of resolution, and recurrence of lesion) and benefits of the procedure, as well as the alternatives. Informed consent was obtained. Preparation: The area was prepared a standard fashion. Anesthesia: none Procedure Details: An intralesional injection was performed with Kenalog 5 mg/cc. 0.1 cc in total were injected. NDC #: 1761-6073-71 Exp: 06/2024 Location: L lower vermillion lip Total number of injections: 3 The patient was instructed on post-op care. Recommend OTC analgesia as needed for pain.   ACNE  CYST- inflamed Exam: Subcutaneous nodule  at L upper lip 0.6 cm  Treatment: Benign-appearing. Exam most consistent with an epidermal inclusion cyst. Discussed that a cyst is a benign growth that can grow over time and sometimes get irritated or inflamed. Recommend observation if it is not bothersome. Discussed option of surgical excision to remove it if it is growing, symptomatic, or other changes noted. Please call for new or changing lesions so they can be evaluated. Start Doxycycline 100mg  1 po bid x 2wks Start Effaclar Duo qd, sample x 1   Doxycycline should be taken with food to prevent nausea. Do not lay down for 30 minutes after taking. Be cautious with sun exposure and use good sun protection while on this medication. Pregnant women should not take this medication.    Procedure Note Intralesional Injection  Location: L upper lip  Informed Consent: Discussed risks (infection, pain, bleeding, bruising, thinning of the skin, loss of skin pigment, lack of resolution, and recurrence of lesion) and benefits of the procedure, as well as the alternatives. Informed consent was obtained. Preparation: The area was prepared a standard fashion.  Anesthesia: none  Procedure Details: An intralesional injection was performed with Kenalog 5 mg/cc. 0.02 cc in total were injected. NDC #: 6578-4696-29 Exp: 06/2024  Total number of injections: 1  Plan: The patient was instructed on post-op care. Recommend OTC analgesia as needed for pain.   Return in about 6 months (around 05/18/2023) for Psoriasis f/u.  I, Ardis Rowan, RMA, am acting as scribe for Willeen Niece, MD .   Documentation: I have reviewed the above documentation for accuracy and completeness, and I agree with the above.  Willeen Niece, MD

## 2023-02-02 ENCOUNTER — Other Ambulatory Visit: Payer: Self-pay | Admitting: Family Medicine

## 2023-02-02 DIAGNOSIS — M7989 Other specified soft tissue disorders: Secondary | ICD-10-CM

## 2023-03-05 ENCOUNTER — Other Ambulatory Visit: Payer: Self-pay | Admitting: Dermatology

## 2023-03-15 ENCOUNTER — Ambulatory Visit
Admission: RE | Admit: 2023-03-15 | Discharge: 2023-03-15 | Disposition: A | Discharge status: Home / Self Care | Payer: Managed Care, Other (non HMO) | Source: Ambulatory Visit | Attending: Family Medicine | Admitting: Family Medicine

## 2023-03-15 DIAGNOSIS — M7989 Other specified soft tissue disorders: Secondary | ICD-10-CM

## 2023-05-23 ENCOUNTER — Ambulatory Visit: Payer: Managed Care, Other (non HMO) | Admitting: Dermatology

## 2023-09-22 ENCOUNTER — Other Ambulatory Visit: Payer: Self-pay | Admitting: Dermatology

## 2023-09-22 DIAGNOSIS — L409 Psoriasis, unspecified: Secondary | ICD-10-CM

## 2024-01-31 ENCOUNTER — Other Ambulatory Visit: Payer: Self-pay

## 2024-01-31 MED ORDER — FLUZONE 0.5 ML IM SUSY
0.5000 mL | PREFILLED_SYRINGE | Freq: Once | INTRAMUSCULAR | 0 refills | Status: AC
  Filled 2024-01-31: qty 0.5, 1d supply, fill #0
